# Patient Record
Sex: Male | Born: 1970
Health system: Southern US, Community
[De-identification: ages and names within clinical notes are randomized; demographics above are authoritative.]

## PROBLEM LIST (undated history)

## (undated) DIAGNOSIS — Q600 Renal agenesis, unilateral: Secondary | ICD-10-CM

## (undated) DIAGNOSIS — N189 Chronic kidney disease, unspecified: Secondary | ICD-10-CM

## (undated) DIAGNOSIS — Z8553 Personal history of malignant neoplasm of renal pelvis: Secondary | ICD-10-CM

## (undated) DIAGNOSIS — C679 Malignant neoplasm of bladder, unspecified: Secondary | ICD-10-CM

## (undated) DIAGNOSIS — Z87898 Personal history of other specified conditions: Secondary | ICD-10-CM

## (undated) DIAGNOSIS — R3911 Hesitancy of micturition: Secondary | ICD-10-CM

## (undated) DIAGNOSIS — IMO0002 Reserved for concepts with insufficient information to code with codable children: Secondary | ICD-10-CM

## (undated) DIAGNOSIS — R030 Elevated blood-pressure reading, without diagnosis of hypertension: Secondary | ICD-10-CM

---

## 2001-05-01 ENCOUNTER — Emergency Department (HOSPITAL_COMMUNITY): Admission: EM | Admit: 2001-05-01 | Discharge: 2001-05-01 | Payer: Self-pay | Admitting: Emergency Medicine

## 2004-12-02 ENCOUNTER — Emergency Department (HOSPITAL_COMMUNITY): Admission: EM | Admit: 2004-12-02 | Discharge: 2004-12-02 | Payer: Self-pay | Admitting: Emergency Medicine

## 2006-05-16 ENCOUNTER — Emergency Department (HOSPITAL_COMMUNITY): Admission: EM | Admit: 2006-05-16 | Discharge: 2006-05-16 | Payer: Self-pay | Admitting: Emergency Medicine

## 2006-06-19 ENCOUNTER — Emergency Department (HOSPITAL_COMMUNITY): Admission: EM | Admit: 2006-06-19 | Discharge: 2006-06-19 | Payer: Self-pay | Admitting: Emergency Medicine

## 2007-12-11 ENCOUNTER — Emergency Department (HOSPITAL_COMMUNITY): Admission: EM | Admit: 2007-12-11 | Discharge: 2007-12-11 | Payer: Self-pay | Admitting: Emergency Medicine

## 2009-04-02 ENCOUNTER — Emergency Department (HOSPITAL_COMMUNITY): Admission: EM | Admit: 2009-04-02 | Discharge: 2009-04-02 | Payer: Self-pay | Admitting: Emergency Medicine

## 2010-05-01 ENCOUNTER — Emergency Department (HOSPITAL_COMMUNITY)
Admission: EM | Admit: 2010-05-01 | Discharge: 2010-05-01 | Payer: Self-pay | Source: Home / Self Care | Admitting: Emergency Medicine

## 2011-08-02 ENCOUNTER — Emergency Department (INDEPENDENT_AMBULATORY_CARE_PROVIDER_SITE_OTHER): Payer: Worker's Compensation

## 2011-08-02 ENCOUNTER — Emergency Department (HOSPITAL_BASED_OUTPATIENT_CLINIC_OR_DEPARTMENT_OTHER)
Admission: EM | Admit: 2011-08-02 | Discharge: 2011-08-02 | Disposition: A | Discharge status: Home / Self Care | Payer: Worker's Compensation | Attending: Emergency Medicine | Admitting: Emergency Medicine

## 2011-08-02 ENCOUNTER — Encounter (HOSPITAL_BASED_OUTPATIENT_CLINIC_OR_DEPARTMENT_OTHER): Payer: Self-pay

## 2011-08-02 DIAGNOSIS — R0789 Other chest pain: Secondary | ICD-10-CM | POA: Insufficient documentation

## 2011-08-02 DIAGNOSIS — R0602 Shortness of breath: Secondary | ICD-10-CM | POA: Insufficient documentation

## 2011-08-02 DIAGNOSIS — J9801 Acute bronchospasm: Secondary | ICD-10-CM

## 2011-08-02 DIAGNOSIS — X398XXA Other exposure to forces of nature, initial encounter: Secondary | ICD-10-CM | POA: Insufficient documentation

## 2011-08-02 DIAGNOSIS — R059 Cough, unspecified: Secondary | ICD-10-CM | POA: Insufficient documentation

## 2011-08-02 DIAGNOSIS — F17200 Nicotine dependence, unspecified, uncomplicated: Secondary | ICD-10-CM | POA: Insufficient documentation

## 2011-08-02 DIAGNOSIS — R05 Cough: Secondary | ICD-10-CM | POA: Insufficient documentation

## 2011-08-02 MED ORDER — ALBUTEROL SULFATE (5 MG/ML) 0.5% IN NEBU
2.5000 mg | INHALATION_SOLUTION | Freq: Once | RESPIRATORY_TRACT | Status: AC
  Administered 2011-08-02: 2.5 mg via RESPIRATORY_TRACT
  Filled 2011-08-02: qty 0.5

## 2011-08-02 MED ORDER — ALBUTEROL SULFATE HFA 108 (90 BASE) MCG/ACT IN AERS: 2.0000 | INHALATION_SPRAY | RESPIRATORY_TRACT | Status: DC | PRN

## 2011-08-02 NOTE — ED Provider Notes (Signed)
History     CSN: [id number]  Arrival date & time 08/02/11  1311   First MD Initiated Contact with Patient 08/02/11 1322      Chief Complaint  Patient presents with  . Shortness of Breath    (Consider location/radiation/quality/duration/timing/severity/associated sxs/prior treatment) Patient is a 41 y.o. male presenting with shortness of breath. The history is provided by the patient.  Shortness of Breath  Associated symptoms include shortness of breath.  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 He noticed a strong chemical odor and started coughing and tightness in his chest and some difficulty breathing. The tightness in the chest has subsided but is still present. He no longer feels like he is having difficulty breathing. Cough was nonproductive. He was unable to characterize the odor but states that he had a similar exposure on one other occasion. He does not know what the agent was since the material was inside a garbage truck which had collected from numerous locations. Symptoms are moderate to severe.  Past Medical History  Diagnosis Date  . Heart murmur of newborn     History reviewed. No pertinent past surgical history.  History reviewed. No pertinent family history.  History  Substance Use Topics  . Smoking status: Current Everyday Smoker -- 0.5 packs/day    Types: Cigarettes  . Smokeless tobacco: Never Used  . Alcohol Use: 33.6 oz/week    56 Cans of beer per week      Review of Systems  Respiratory: Positive for shortness of breath.   All other systems reviewed and are negative.    Allergies  Valium  Home Medications  No current outpatient prescriptions on file.  BP 163/99  Pulse 80  Temp 98.3 F (36.8 C)  Resp 20  Ht 5\' 7"  (1.702 m)  Wt 205 lb (92.987 kg)  BMI 32.11 kg/m2  SpO2 99%  Physical Exam  Nursing note and vitals reviewed.  41 year old male who is resting comfortably in no acute  distress. Vital signs are significant for mild hypertension with blood pressure 163/99. Oxygen saturation is 99% which is normal. Head is normocephalic atraumatic. PERRLA, EOMI. Oropharynx is clear. Neck is nontender supple without stridor. Lungs have scattered wheezes without rales or rhonchi. Heart has regular rate rhythm without murmur. Abdomen is soft, flat, nontender without masses or hepatosplenomegaly. Extremities have no cyanosis or edema, full range of motion is present. Skin is warm and dry without rash. Neurologic: Mental status is normal, cranial nerves are intact, there no focal motor or sensory deficits.  ED Course  Procedures (including critical care time)  Dg Chest 2 View  08/02/2011  *RADIOLOGY REPORT*  Clinical Data: Shortness of breath  CHEST - 2 VIEW  Comparison: 04/02/2009  Findings: Cardiomediastinal silhouette is stable.  No acute infiltrate or pleural effusion.  No pulmonary edema.  Bony thorax is stable.  IMPRESSION: No active disease.  No significant change.  Original Report Authenticated By: Natasha Mead, M.D.     ECG shows normal sinus rhythm with a rate of 78, no ectopy. Normal axis. Normal P wave. Normal QRS. Normal intervals. Normal ST and T waves. Impression: normal ECG. No old ECG available for comparison.  Reevaluation after nebulizer treatment: His lungs are clear and his chest tightness has completely resolved. You'll be sent home with a prescription for an albuterol inhaler to use just in case symptoms recur and he is given a note to be off work the  remainder of today.  1. Bronchospasm       MDM  Reactive bronchospasm secondary to exposure to unknown agent. I don't see any evidence of any caustic exposure and no evidence of a toxidrome. He'll be treated symptomatically with albuterol and chest x-ray obtained.        Dione Booze, MD 08/02/11 [phone]

## 2011-08-02 NOTE — ED Notes (Signed)
Pt c/o of chest tightness that started 1 hour ago after smelling something while at work. Describes pain as constant and non radiating . States was SOB at that time with "sweating"  Presently not SOB or diaphroitc

## 2011-08-02 NOTE — ED Notes (Signed)
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  Pt states that he has ongoing chest tightness, and lost breath upon inhalation in nad at this time.  No cough.

## 2011-08-02 NOTE — Discharge Instructions (Signed)
Bronchospasm, Adult Bronchospasm means that there is a spasm or tightening of the airways going into the lungs. Because the airways go into a spasm and get smaller it makes breathing more difficult. For reasons not completely known, workings (functions) of the airways designed to protect the lungs become over active. This causes the airways to become more sensitive to:  Infection.   Weather.   Exercise.   Irritants.   Things that cause allergic reactions or allergies (allergens).  Frequent coughing or respiratory episodes should be checked for the cause. This condition may be made worse by exercise. CAUSES  Inflammation is often the cause of this condition. Allergy, viral respiratory infections, or irritants in the air often cause this problem. Allergic reactions produce immediate and delayed responses. Late reactions may produce more serious inflammation. This may lead to increased reactivity of the airways. Sometimes this is inherited. Some common triggers are:  Allergies.   Infection commonly triggers attacks. Antibiotics are not helpful for viral infections and usually do not help with attacks of bronchospasm.   Exercise (running, etc.) can trigger an attack. Proper pre-exercise medications help most individuals participate in sports. Swimming is the least likely sport to cause problems.   Irritants (for example, pollution, cigarette smoke, strong odors, aerosol sprays, paint fumes, etc.) may trigger attacks. You cannot smoke and do not allow smoking in your home. This is absolutely necessary. Show this instruction to mates, relatives and significant others that may not agree with you.   Weather changes may cause lung problems but moving around trying to find an ideal climate does not seem to be overly helpful. Winds increase molds and pollens in the air. Rain refreshes the air by washing irritants out. Cold air may cause irritation.   Emotional problems do not cause lung problems but  can trigger attacks.  SYMPTOMS  Wheezing is the most common symptom. Frequent coughing (with or without exercise and or crying) and repeated respiratory infections are all early warning signs of bronchospasm. Chest tightness and shortness of breath are other symptoms. DIAGNOSIS  Early hidden bronchospasm may go for long periods of time without being detected. This is especially true if wheezing cannot be detected by your caregiver. Lung (pulmonary) function studies may help with diagnosis in these cases. HOME CARE INSTRUCTIONS   It is necessary to remain calm during an attack. Try to relax and breathe more slowly. During this time medications may be given. If any breathing problems seem to be getting worse and are unresponsive to treatment seek immediate medical care.   If you have severe breathing difficulty or have had a life threatening attack it is probably a good idea for you to learn how to give adrenaline (epi-pen) or use an anaphylaxis kit. Your caregiver can help you with this. These are the same kits carried by people who have severe allergic reactions. This is especially important if you do not have readily accessible medical care.   With any severe breathing problems where epinephrine (adrenaline) has been given at home call 911 immediately as the delayed reaction may be even more severe.  SEEK MEDICAL CARE IF:   There is wheezing and shortness of breath, even if medications are given to prevent attacks.   An oral temperature above 102 F (38.9 C) develops.   There are muscle aches, chest pain, or thickening of sputum.   The sputum changes from clear or white to yellow, green, gray, or bloody.   There are problems that may be related to   the medicine you are given, such as a rash, itching, swelling, or trouble breathing.  SEEK IMMEDIATE MEDICAL CARE IF:   The usual medicines do not stop your wheezing, or there is increased coughing.   You have increased difficulty breathing.    MAKE SURE YOU:   Understand these instructions.   Will watch your condition.   Will get help right away if you are not doing well or get worse.  Document Released: 04/04/2003 Document Revised: 03/21/2011 Document Reviewed: 11/18/2007 [facility] Patient Information 2012 [place], [place].  Albuterol inhalation aerosol What is this medicine? ALBUTEROL (al Gaspar Bidding) is a bronchodilator. It helps open up the airways in your lungs to make it easier to breathe. This medicine is used to treat and to prevent bronchospasm. This medicine may be used for other purposes; ask your health care provider or pharmacist if you have questions. What should I tell my health care provider before I take this medicine? They need to know if you have any of the following conditions: -diabetes -heart disease or irregular heartbeat -high blood pressure -pheochromocytoma -seizures -thyroid disease -an unusual or allergic reaction to albuterol, levalbuterol, sulfites, other medicines, foods, dyes, or preservatives -pregnant or trying to get pregnant -breast-feeding How should I use this medicine? This medicine is for inhalation through the mouth. Follow the directions on your prescription label. Take your medicine at regular intervals. Do not use more often than directed. Make sure that you are using your inhaler correctly. Ask you doctor or health care provider if you have any questions. Use this medicine before you use any other inhaler. Wait 5 minutes or more before between using different inhalers. Talk to your pediatrician regarding the use of this medicine in children. Special care may be needed. Overdosage: If you think you have taken too much of this medicine contact a poison control center or emergency room at once. NOTE: This medicine is only for you. Do not share this medicine with others. What if I miss a dose? If you miss a dose, use it as soon as you can. If it is almost time for your next dose,  use only that dose. Do not use double or extra doses. What may interact with this medicine? -anti-infectives like chloroquine and pentamidine -caffeine -cisapride -diuretics -medicines for colds -medicines for depression or for emotional or psychotic conditions -medicines for weight loss including some herbal products -methadone -some antibiotics like clarithromycin, erythromycin, levofloxacin, and linezolid -some heart medicines -steroid hormones like dexamethasone, cortisone, hydrocortisone -theophylline -thyroid hormones This list may not describe all possible interactions. Give your health care provider a list of all the medicines, herbs, non-prescription drugs, or dietary supplements you use. Also tell them if you smoke, drink alcohol, or use illegal drugs. Some items may interact with your medicine. What should I watch for while using this medicine? Tell your doctor or health care professional if your symptoms do not improve. Do not use extra albuterol. If your asthma or bronchitis gets worse while you are using this medicine, call your doctor right away. If your mouth gets dry try chewing sugarless gum or sucking hard candy. Drink water as directed. What side effects may I notice from receiving this medicine? Side effects that you should report to your doctor or health care professional as soon as possible: -allergic reactions like skin rash, itching or hives, swelling of the face, lips, or tongue -breathing problems -chest pain -feeling faint or lightheaded, falls -high blood pressure -irregular heartbeat -fever -muscle cramps or  weakness -pain, tingling, numbness in the hands or feet -vomiting Side effects that usually do not require medical attention (report to your doctor or health care professional if they continue or are bothersome): -cough -difficulty sleeping -headache -nervousness or trembling -stomach upset -stuffy or runny nose -throat irritation -unusual  taste This list may not describe all possible side effects. Call your doctor for medical advice about side effects. You may report side effects to FDA at 1-800-FDA-1088. Where should I keep my medicine? Keep out of the reach of children. Store at room temperature between 15 and 30 degrees C (59 and 86 degrees F). The contents are under pressure and may burst when exposed to heat or flame. Do not freeze. This medicine does not work as well if it is too cold. Throw away any unused medicine after the expiration date. Inhalers need to be thrown away after the labeled number of puffs have been used or by the expiration date; whichever comes first. Ventolin HFA should be thrown away 12 months after removing from foil pouch. Check the instructions that come with your medicine. NOTE: This sheet is a summary. It may not cover all possible information. If you have questions about this medicine, talk to your doctor, pharmacist, or health care provider.  2012, [employer]vier/[employer] Standard. (08/17/2010 11:00:52 AM)

## 2011-08-02 NOTE — ED Notes (Signed)
Chart reviewed and care assumed.  Dr. Preston Fleeting at bedside.

## 2012-01-11 ENCOUNTER — Emergency Department (HOSPITAL_COMMUNITY)
Admission: EM | Admit: 2012-01-11 | Discharge: 2012-01-11 | Disposition: A | Discharge status: Home / Self Care | Payer: Self-pay | Attending: Emergency Medicine | Admitting: Emergency Medicine

## 2012-01-11 ENCOUNTER — Emergency Department (HOSPITAL_COMMUNITY): Payer: Self-pay

## 2012-01-11 ENCOUNTER — Other Ambulatory Visit: Payer: Self-pay

## 2012-01-11 ENCOUNTER — Encounter (HOSPITAL_COMMUNITY): Payer: Self-pay | Admitting: *Deleted

## 2012-01-11 DIAGNOSIS — F17200 Nicotine dependence, unspecified, uncomplicated: Secondary | ICD-10-CM | POA: Insufficient documentation

## 2012-01-11 DIAGNOSIS — J40 Bronchitis, not specified as acute or chronic: Secondary | ICD-10-CM | POA: Insufficient documentation

## 2012-01-11 MED ORDER — ALBUTEROL SULFATE HFA 108 (90 BASE) MCG/ACT IN AERS
2.0000 | INHALATION_SPRAY | RESPIRATORY_TRACT | Status: DC | PRN
  Administered 2012-01-11: 2 via RESPIRATORY_TRACT
  Filled 2012-01-11: qty 6.7

## 2012-01-11 MED ORDER — ALBUTEROL SULFATE (5 MG/ML) 0.5% IN NEBU
5.0000 mg | INHALATION_SOLUTION | Freq: Once | RESPIRATORY_TRACT | Status: AC
  Administered 2012-01-11: 5 mg via RESPIRATORY_TRACT
  Filled 2012-01-11: qty 1

## 2012-01-11 MED ORDER — AZITHROMYCIN 250 MG PO TABS: 250.0000 mg | ORAL_TABLET | Freq: Every day | ORAL | Status: DC

## 2012-01-11 MED ORDER — AZITHROMYCIN 250 MG PO TABS
500.0000 mg | ORAL_TABLET | Freq: Once | ORAL | Status: AC
  Administered 2012-01-11: 500 mg via ORAL
  Filled 2012-01-11: qty 2

## 2012-01-11 NOTE — ED Provider Notes (Signed)
History     CSN: [id number]  Arrival date & time 01/11/12  0040   First MD Initiated Contact with Patient 01/11/12 0207      Chief Complaint  Patient presents with  . Cough    (Consider location/radiation/quality/duration/timing/severity/associated sxs/prior treatment) HPI Comments: URI symptoms that have resolved but persistent cough some yellow sputum and wheezing   Patient is a 41 y.o. male presenting with cough. The history is provided by the patient.  Cough This is a new problem. The current episode started more than 1 week ago. The problem occurs every few minutes. The problem has been gradually worsening. The cough is productive of sputum. The maximum temperature recorded prior to his arrival was 100 to 100.9 F. Associated symptoms include wheezing. Pertinent negatives include no chest pain, no chills, no headaches, no rhinorrhea and no shortness of breath.    Past Medical History  Diagnosis Date  . Heart murmur of newborn     History reviewed. No pertinent past surgical history.  History reviewed. No pertinent family history.  History  Substance Use Topics  . Smoking status: Current Every Day Smoker -- 2.0 packs/day    Types: Cigarettes  . Smokeless tobacco: Never Used  . Alcohol Use: 33.6 oz/week    56 Cans of beer per week      Review of Systems  Constitutional: Negative for fever and chills.  HENT: Negative for congestion and rhinorrhea.   Respiratory: Positive for cough and wheezing. Negative for shortness of breath.   Cardiovascular: Negative for chest pain.  Neurological: Negative for dizziness, weakness and headaches.    Allergies  Valium  Home Medications   Current Outpatient Rx  Name Route Sig Dispense Refill  . AZITHROMYCIN 250 MG PO TABS Oral Take 1 tablet (250 mg total) by mouth daily. 4 tablet 0    BP 121/77  Pulse 111  Temp 98.2 F (36.8 C) (Oral)  Resp 22  SpO2 96%  Physical Exam  Constitutional: He is oriented to person,  place, and time. He appears well-developed and well-nourished.  Neck: Normal range of motion.  Cardiovascular: Normal rate and regular rhythm.   Pulmonary/Chest: Effort normal. No respiratory distress. He has wheezes.  Abdominal: He exhibits no distension.  Musculoskeletal: Normal range of motion.  Neurological: He is alert and oriented to person, place, and time.  Skin: Skin is warm.    ED Course  Procedures (including critical care time)  Labs Reviewed - No data to display Dg Chest 2 View  01/11/2012  *RADIOLOGY REPORT*  Clinical Data: Persistent cough  CHEST - 2 VIEW  Comparison: Chest radiograph 08/02/2011.  Findings: Normal mediastinum and heart silhouette.  Costophrenic angles are clear.  No effusion, infiltrate, or pneumothorax.  There is chronic bronchitic markings centrally.  IMPRESSION:  1.  No acute cardiopulmonary findings. 2.  Chronic bronchitic markings suggest bronchiolitis or chronic bronchitic change.   Original Report Authenticated By: Genevive Bi, M.D.      1. Bronchitis       MDM   Xray reviewed + for bronchitis        Arman Filter, NP 01/11/12 0432  Arman Filter, NP 01/11/12 4098  Arman Filter, NP 01/11/12 [phone]

## 2012-01-11 NOTE — ED Notes (Signed)
NP notified pt is feeling better and is ready to go.  Pt continues to have post exp wheezes, but sats wnl.

## 2012-01-11 NOTE — ED Notes (Signed)
Pt states he has had a cold for a week.  Tried mucinex with no relief.  Denies having asthma but states he is a smoker.

## 2012-01-11 NOTE — ED Notes (Signed)
Pt c/o fever, productive yellow cough, congestion, chest tightness since Wednesday.

## 2012-01-11 NOTE — ED Provider Notes (Signed)
Medical screening examination/treatment/procedure(s) were performed by non-physician practitioner and as supervising physician I was immediately available for consultation/collaboration.  Demitra Danley K Leeloo Silverthorne-Rasch, MD 01/11/12 0451 

## 2012-06-20 ENCOUNTER — Emergency Department (HOSPITAL_COMMUNITY): Payer: Self-pay

## 2012-06-20 ENCOUNTER — Encounter (HOSPITAL_COMMUNITY): Payer: Self-pay | Admitting: Emergency Medicine

## 2012-06-20 ENCOUNTER — Emergency Department (HOSPITAL_COMMUNITY)
Admission: EM | Admit: 2012-06-20 | Discharge: 2012-06-20 | Disposition: A | Discharge status: Home / Self Care | Payer: Self-pay | Attending: Emergency Medicine | Admitting: Emergency Medicine

## 2012-06-20 DIAGNOSIS — Y9329 Activity, other involving ice and snow: Secondary | ICD-10-CM | POA: Insufficient documentation

## 2012-06-20 DIAGNOSIS — Y9289 Other specified places as the place of occurrence of the external cause: Secondary | ICD-10-CM | POA: Insufficient documentation

## 2012-06-20 DIAGNOSIS — S2232XA Fracture of one rib, left side, initial encounter for closed fracture: Secondary | ICD-10-CM

## 2012-06-20 DIAGNOSIS — S2249XA Multiple fractures of ribs, unspecified side, initial encounter for closed fracture: Secondary | ICD-10-CM | POA: Insufficient documentation

## 2012-06-20 DIAGNOSIS — I10 Essential (primary) hypertension: Secondary | ICD-10-CM | POA: Insufficient documentation

## 2012-06-20 DIAGNOSIS — W010XXA Fall on same level from slipping, tripping and stumbling without subsequent striking against object, initial encounter: Secondary | ICD-10-CM | POA: Insufficient documentation

## 2012-06-20 DIAGNOSIS — F17200 Nicotine dependence, unspecified, uncomplicated: Secondary | ICD-10-CM | POA: Insufficient documentation

## 2012-06-20 DIAGNOSIS — R011 Cardiac murmur, unspecified: Secondary | ICD-10-CM | POA: Insufficient documentation

## 2012-06-20 DIAGNOSIS — W108XXA Fall (on) (from) other stairs and steps, initial encounter: Secondary | ICD-10-CM | POA: Insufficient documentation

## 2012-06-20 DIAGNOSIS — W009XXA Unspecified fall due to ice and snow, initial encounter: Secondary | ICD-10-CM

## 2012-06-20 MED ORDER — NAPROXEN 500 MG PO TABS: 500.0000 mg | ORAL_TABLET | Freq: Two times a day (BID) | ORAL | Status: DC

## 2012-06-20 MED ORDER — OXYCODONE-ACETAMINOPHEN 5-325 MG PO TABS: 1.0000 | ORAL_TABLET | ORAL | Status: DC | PRN

## 2012-06-20 MED ORDER — OXYCODONE-ACETAMINOPHEN 5-325 MG PO TABS
2.0000 | ORAL_TABLET | Freq: Once | ORAL | Status: AC
  Administered 2012-06-20: 2 via ORAL
  Filled 2012-06-20: qty 2

## 2012-06-20 NOTE — ED Notes (Signed)
Pt given incentive spirometer and instructed to use it at home throughout the day.

## 2012-06-20 NOTE — ED Notes (Signed)
Onset one day ago slipped top stair onto left side of ribs down 6 steps.  Pain currently 10/10 sharp left side of ribs, left hip and thigh abrasions.

## 2012-06-20 NOTE — ED Provider Notes (Signed)
History    This chart was scribed for non-physician practitioner working with Timothy Bucco, MD by Timothy Hickman, ED Scribe. This patient was seen in room TR11C/TR11C and the patient's care was started at 4:00 PM.    CSN: [id number]  Arrival date & time 06/20/12  1103   First MD Initiated Contact with Patient 06/20/12 1516      Chief Complaint  Patient presents with  . Rib Injury  . Fall     The history is provided by the patient. No language interpreter was used.  Timothy Hickman is a 42 y.o. male who presents to the Emergency Department complaining of constant, sharp left posterior lower rib pain rated as 9/10 with sudden onset after slipping on ice and landing on a set of stairs on the left side and then sliding down 5 stairs.  Pain worsened by breathing, movement and palpation; nothing makes it better.  No head trauma or LOC.  Pt denies neck pain, back pain, loss of vision, numbness or tingling in extremities, loss of bowel or bladder control.  No OCM, ice, or heat used for pain.      Past Medical History  Diagnosis Date  . Heart murmur of newborn   . Hypertension     History reviewed. No pertinent past surgical history.  No family history on file.  History  Substance Use Topics  . Smoking status: Current Every Day Smoker -- 2.00 packs/day    Types: Cigarettes  . Smokeless tobacco: Never Used  . Alcohol Use: 0.0 oz/week      Review of Systems  HENT: Negative for neck pain.   Musculoskeletal: Negative for back pain.       Left posterior rib pain  Neurological: Negative for numbness.  All other systems reviewed and are negative.    Allergies  Valium  Home Medications   Current Outpatient Rx  Name  Route  Sig  Dispense  Refill  . naproxen (NAPROSYN) 500 MG tablet   Oral   Take 1 tablet (500 mg total) by mouth 2 (two) times daily with a meal.   30 tablet   0   . oxyCODONE-acetaminophen (PERCOCET) 5-325 MG per tablet   Oral   Take 1 tablet by mouth every 4  (four) hours as needed for pain.   20 tablet   0     BP 181/114  Pulse 94  Temp(Src) 98 F (36.7 C) (Oral)  Resp 16  SpO2 98%  Physical Exam  Nursing note and vitals reviewed. Constitutional: He is oriented to person, place, and time. He appears well-developed and well-nourished. No distress.  HENT:  Head: Normocephalic and atraumatic.  Mouth/Throat: Oropharynx is clear and moist.  Eyes: Conjunctivae and EOM are normal. Pupils are equal, round, and reactive to light.  Neck: Normal range of motion. Neck supple.  Cardiovascular: Normal rate, regular rhythm, normal heart sounds and intact distal pulses.   Intact distal pulses, capillary refill < 3 seconds  Pulmonary/Chest: Effort normal and breath sounds normal. No respiratory distress. He has no wheezes. He has no rales. He exhibits no tenderness.  Abdominal: Soft. Bowel sounds are normal. He exhibits no distension. There is no tenderness.  No ecchymosis  Musculoskeletal:  No spinous process or paraspinal tenderness, left side lower ribs mild swelling, no ecchymosis, tender to palpation, abrasion along left hip.  All other extremities with normal ROM  Neurological: He is alert and oriented to person, place, and time.  Speech is clear and goal oriented, follows  commands Normal strength in upper and lower extremities bilaterally including dorsiflexion and plantar flexion, strong and equal grip strength Sensation normal to light and sharp touch Moves extremities without ataxia, coordination intact Normal gait and balance   Skin: Skin is warm. He is not diaphoretic. No erythema.  Skin intact, no tenting  Psychiatric: He has a normal mood and affect.    ED Course  Procedures (including critical care time) DIAGNOSTIC STUDIES: Oxygen Saturation is 98% on room air, normal by my interpretation.    COORDINATION OF CARE: 4:06 PM- Informed pt of fractures noted to ribs 8 and 9.  Discussed incentive spirometer to be used at home.   Discussed follow-up with orthopedist.  Pt understands and agrees with plan.     Dg Ribs Unilateral W/chest Left  06/20/2012  *RADIOLOGY REPORT*  Clinical Data: Fall, left anterior chest pain  LEFT RIBS AND CHEST - 3+ VIEW  Comparison: Chest radiograph dated 01/11/2012  Findings: Lungs are clear. No pleural effusion or pneumothorax.  Cardiomediastinal silhouette is within normal limits.  Possible nondisplaced fractures involving the left anterolateral 8th and 9th ribs.  IMPRESSION: Possible nondisplaced fractures involving the left anterolateral 8th and 9th ribs.   Original Report Authenticated By: Timothy Bills, M.D.      1. Rib fracture, left, closed, initial encounter   2. Fall from slipping on ice, initial encounter       MDM  Timothy Hickman presents after fall.  Pt with nondisplaced rib fractures on x-ray. Tender to palpation in the area. Patient with good tidal volume, no hemoptysis, no decreased breath sounds and no pneumothorax on x-ray. Patient given definitive rib structures including use of incentive spirometer 10 times per hour to prevent pneumonia; use of pillow when coughing and taking NSAIDs along with pain medications. Patient also advised to followup with orthopedics if not improved in one week.  I have also discussed reasons to return immediately to the ER including difficulty breathing, hemoptysis.  Patient expresses understanding and agrees with plan.  1. Medications: naproxyn, percocet, usual home medications 2. Treatment: rest, drink plenty of fluids, gentle stretching as discussed, alternate ice and heat, use pillow when coughing, use incentive spirometer 10 times per hour while awake 3. Follow Up: Please followup with your primary doctor for discussion of your diagnoses and further evaluation after today's visit; if you do not have a primary care doctor use the resource guide provided to find one; followup with orthopedics if no improvement in one week  I personally  performed the services described in this documentation, which was scribed in my presence. The recorded information has been reviewed and is accurate.      Timothy Client Muthersbaugh, PA-C 06/20/12 1643

## 2012-06-20 NOTE — ED Notes (Signed)
Pt c/o left sided rib, pt reports he woke up with the pain, last night he fell on the ice onto his left side. Pt in nad, ambulatory to the room, skin warm and dry, resp e/u.

## 2012-06-20 NOTE — ED Provider Notes (Signed)
Medical screening examination/treatment/procedure(s) were performed by non-physician practitioner and as supervising physician I was immediately available for consultation/collaboration.   Rolan Bucco, MD 06/20/12 1754

## 2012-06-20 NOTE — ED Notes (Signed)
Pt reports his BP various but has been very stressed with his job the past week.

## 2013-05-17 ENCOUNTER — Emergency Department (HOSPITAL_COMMUNITY)
Admission: EM | Admit: 2013-05-17 | Discharge: 2013-05-17 | Disposition: A | Discharge status: Home / Self Care | Payer: Self-pay | Attending: Emergency Medicine | Admitting: Emergency Medicine

## 2013-05-17 ENCOUNTER — Encounter (HOSPITAL_COMMUNITY): Payer: Self-pay | Admitting: Emergency Medicine

## 2013-05-17 ENCOUNTER — Emergency Department (HOSPITAL_COMMUNITY): Payer: Self-pay

## 2013-05-17 DIAGNOSIS — F17200 Nicotine dependence, unspecified, uncomplicated: Secondary | ICD-10-CM | POA: Insufficient documentation

## 2013-05-17 DIAGNOSIS — R062 Wheezing: Secondary | ICD-10-CM | POA: Insufficient documentation

## 2013-05-17 DIAGNOSIS — I10 Essential (primary) hypertension: Secondary | ICD-10-CM | POA: Insufficient documentation

## 2013-05-17 DIAGNOSIS — Z79899 Other long term (current) drug therapy: Secondary | ICD-10-CM | POA: Insufficient documentation

## 2013-05-17 DIAGNOSIS — R0789 Other chest pain: Secondary | ICD-10-CM | POA: Insufficient documentation

## 2013-05-17 DIAGNOSIS — J069 Acute upper respiratory infection, unspecified: Secondary | ICD-10-CM | POA: Insufficient documentation

## 2013-05-17 DIAGNOSIS — R011 Cardiac murmur, unspecified: Secondary | ICD-10-CM | POA: Insufficient documentation

## 2013-05-17 MED ORDER — GUAIFENESIN-CODEINE 100-10 MG/5ML PO SOLN: 5.0000 mL | Freq: Four times a day (QID) | ORAL | Status: DC | PRN

## 2013-05-17 MED ORDER — ALBUTEROL SULFATE HFA 108 (90 BASE) MCG/ACT IN AERS: 2.0000 | INHALATION_SPRAY | RESPIRATORY_TRACT | Status: DC | PRN

## 2013-05-17 MED ORDER — GUAIFENESIN 100 MG/5ML PO SOLN
5.0000 mL | Freq: Once | ORAL | Status: AC
  Administered 2013-05-17: 100 mg via ORAL
  Filled 2013-05-17 (×2): qty 5

## 2013-05-17 MED ORDER — ALBUTEROL SULFATE HFA 108 (90 BASE) MCG/ACT IN AERS
2.0000 | INHALATION_SPRAY | Freq: Once | RESPIRATORY_TRACT | Status: AC
  Administered 2013-05-17: 2 via RESPIRATORY_TRACT
  Filled 2013-05-17: qty 6.7

## 2013-05-17 NOTE — Discharge Instructions (Signed)
Please follow up with your primary care physician in 1-2 days. If you do not have one please call the [place] number listed above. Please take medications as prescribed. Please do not drive on the Robitussin with Codeine as it contains a narcotic. Please read all discharge instructions and return precautions.   Upper Respiratory Infection, Adult An upper respiratory infection (URI) is also sometimes known as the common cold. The upper respiratory tract includes the nose, sinuses, throat, trachea, and bronchi. Bronchi are the airways leading to the lungs. Most people improve within 1 week, but symptoms can last up to 2 weeks. A residual cough may last even longer.  CAUSES Many different viruses can infect the tissues lining the upper respiratory tract. The tissues become irritated and inflamed and often become very moist. Mucus production is also common. A cold is contagious. You can easily spread the virus to others by oral contact. This includes kissing, sharing a glass, coughing, or sneezing. Touching your mouth or nose and then touching a surface, which is then touched by another person, can also spread the virus. SYMPTOMS  Symptoms typically develop 1 to 3 days after you come in contact with a cold virus. Symptoms vary from person to person. They may include:  Runny nose.  Sneezing.  Nasal congestion.  Sinus irritation.  Sore throat.  Loss of voice (laryngitis).  Cough.  Fatigue.  Muscle aches.  Loss of appetite.  Headache.  Low-grade fever. DIAGNOSIS  You might diagnose your own cold based on familiar symptoms, since most people get a cold 2 to 3 times a year. Your caregiver can confirm this based on your exam. Most importantly, your caregiver can check that your symptoms are not due to another disease such as strep throat, sinusitis, pneumonia, asthma, or epiglottitis. Blood tests, throat tests, and X-rays are not necessary to diagnose a common cold, but  they may sometimes be helpful in excluding other more serious diseases. Your caregiver will decide if any further tests are required. RISKS AND COMPLICATIONS  You may be at risk for a more severe case of the common cold if you smoke cigarettes, have chronic heart disease (such as heart failure) or lung disease (such as asthma), or if you have a weakened immune system. The very young and very old are also at risk for more serious infections. Bacterial sinusitis, middle ear infections, and bacterial pneumonia can complicate the common cold. The common cold can worsen asthma and chronic obstructive pulmonary disease (COPD). Sometimes, these complications can require emergency medical care and may be life-threatening. PREVENTION  The best way to protect against getting a cold is to practice good hygiene. Avoid oral or hand contact with people with cold symptoms. Wash your hands often if contact occurs. There is no clear evidence that vitamin C, vitamin E, echinacea, or exercise reduces the chance of developing a cold. However, it is always recommended to get plenty of rest and practice good nutrition. TREATMENT  Treatment is directed at relieving symptoms. There is no cure. Antibiotics are not effective, because the infection is caused by a virus, not by bacteria. Treatment may include:  Increased fluid intake. Sports drinks offer valuable electrolytes, sugars, and fluids.  Breathing heated mist or steam (vaporizer or shower).  Eating chicken soup or other clear broths, and maintaining good nutrition.  Getting plenty of rest.  Using gargles or lozenges for comfort.  Controlling fevers with ibuprofen or acetaminophen as directed by your caregiver.  Increasing usage of  your inhaler if you have asthma. Zinc gel and zinc lozenges, taken in the first 24 hours of the common cold, can shorten the duration and lessen the severity of symptoms. Pain medicines may help with fever, muscle aches, and throat  pain. A variety of non-prescription medicines are available to treat congestion and runny nose. Your caregiver can make recommendations and may suggest nasal or lung inhalers for other symptoms.  HOME CARE INSTRUCTIONS   Only take over-the-counter or prescription medicines for pain, discomfort, or fever as directed by your caregiver.  Use a warm mist humidifier or inhale steam from a shower to increase air moisture. This may keep secretions moist and make it easier to breathe.  Drink enough water and fluids to keep your urine clear or pale yellow.  Rest as needed.  Return to work when your temperature has returned to normal or as your caregiver advises. You may need to stay home longer to avoid infecting others. You can also use a face mask and careful hand washing to prevent spread of the virus. SEEK MEDICAL CARE IF:   After the first few days, you feel you are getting worse rather than better.  You need your caregiver's advice about medicines to control symptoms.  You develop chills, worsening shortness of breath, or brown or red sputum. These may be signs of pneumonia.  You develop yellow or brown nasal discharge or pain in the face, especially when you bend forward. These may be signs of sinusitis.  You develop a fever, swollen neck glands, pain with swallowing, or white areas in the back of your throat. These may be signs of strep throat. SEEK IMMEDIATE MEDICAL CARE IF:   You have a fever.  You develop severe or persistent headache, ear pain, sinus pain, or chest pain.  You develop wheezing, a prolonged cough, cough up blood, or have a change in your usual mucus (if you have chronic lung disease).  You develop sore muscles or a stiff neck. Document Released: 09/25/2000 Document Revised: 06/24/2011 Document Reviewed: 08/03/2010 [facility] Patient Information 2014 [place], [place].   Arterial Hypertension Arterial hypertension (high blood pressure) is a condition of elevated  pressure in your blood vessels. Hypertension over a long period of time is a risk factor for strokes, heart attacks, and heart failure. It is also the leading cause of kidney (renal) failure.  CAUSES   In Adults -- Over 90% of all hypertension has no known cause. This is called essential or primary hypertension. In the other 10% of people with hypertension, the increase in blood pressure is caused by another disorder. This is called secondary hypertension. Important causes of secondary hypertension are:  Heavy alcohol use.  Obstructive sleep apnea.  Hyperaldosterosim (Conn's syndrome).  Steroid use.  Chronic kidney failure.  Hyperparathyroidism.  Medications.  Renal artery stenosis.  Pheochromocytoma.  Cushing's disease.  Coarctation of the aorta.  Scleroderma renal crisis.  Licorice (in excessive amounts).  Drugs (cocaine, methamphetamine). Your caregiver can explain any items above that apply to you.  In Children -- Secondary hypertension is more common and should always be considered.  Pregnancy -- Few women of childbearing age have high blood pressure. However, up to 10% of them develop hypertension of pregnancy. Generally, this will not harm the woman. It may be a sign of 3 complications of pregnancy: preeclampsia, HELLP syndrome, and eclampsia. Follow up and control with medication is necessary. SYMPTOMS   This condition normally does not produce any noticeable symptoms. It is usually found during  a routine exam.  Malignant hypertension is a late problem of high blood pressure. It may have the following symptoms:  Headaches.  Blurred vision.  End-organ damage (this means your kidneys, heart, lungs, and other organs are being damaged).  Stressful situations can increase the blood pressure. If a person with normal blood pressure has their blood pressure go up while being seen by their caregiver, this is often termed "white coat hypertension." Its importance is not  known. It may be related with eventually developing hypertension or complications of hypertension.  Hypertension is often confused with mental tension, stress, and anxiety. DIAGNOSIS  The diagnosis is made by 3 separate blood pressure measurements. They are taken at least 1 week apart from each other. If there is organ damage from hypertension, the diagnosis may be made without repeat measurements. Hypertension is usually identified by having blood pressure readings:  Above 140/90 mmHg measured in both arms, at 3 separate times, over a couple weeks.  Over 130/80 mmHg should be considered a risk factor and may require treatment in patients with diabetes. Blood pressure readings over 120/80 mmHg are called "pre-hypertension" even in non-diabetic patients. To get a true blood pressure measurement, use the following guidelines. Be aware of the factors that can alter blood pressure readings.  Take measurements at least 1 hour after caffeine.  Take measurements 30 minutes after smoking and without any stress. This is another reason to quit smoking  it raises your blood pressure.  Use a proper cuff size. Ask your caregiver if you are not sure about your cuff size.  Most home blood pressure cuffs are automatic. They will measure systolic and diastolic pressures. The systolic pressure is the pressure reading at the start of sounds. Diastolic pressure is the pressure at which the sounds disappear. If you are elderly, measure pressures in multiple postures. Try sitting, lying or standing.  Sit at rest for a minimum of 5 minutes before taking measurements.  You should not be on any medications like decongestants. These are found in many cold medications.  Record your blood pressure readings and review them with your caregiver. If you have hypertension:  Your caregiver may do tests to be sure you do not have secondary hypertension (see "causes" above).  Your caregiver may also look for signs of  metabolic syndrome. This is also called Syndrome X or Insulin Resistance Syndrome. You may have this syndrome if you have type 2 diabetes, abdominal obesity, and abnormal blood lipids in addition to hypertension.  Your caregiver will take your medical and family history and perform a physical exam.  Diagnostic tests may include blood tests (for glucose, cholesterol, potassium, and kidney function), a urinalysis, or an EKG. Other tests may also be necessary depending on your condition. PREVENTION  There are important lifestyle issues that you can adopt to reduce your chance of developing hypertension:  Maintain a normal weight.  Limit the amount of salt (sodium) in your diet.  Exercise often.  Limit alcohol intake.  Get enough potassium in your diet. Discuss specific advice with your caregiver.  Follow a DASH diet (dietary approaches to stop hypertension). This diet is rich in fruits, vegetables, and low-fat dairy products, and avoids certain fats. PROGNOSIS  Essential hypertension cannot be cured. Lifestyle changes and medical treatment can lower blood pressure and reduce complications. The prognosis of secondary hypertension depends on the underlying cause. Many people whose hypertension is controlled with medicine or lifestyle changes can live a normal, healthy life.  RISKS AND COMPLICATIONS  While high blood pressure alone is not an illness, it often requires treatment due to its short- and long-term effects on many organs. Hypertension increases your risk for:  CVAs or strokes (cerebrovascular accident).  Heart failure due to chronically high blood pressure (hypertensive cardiomyopathy).  Heart attack (myocardial infarction).  Damage to the retina (hypertensive retinopathy).  Kidney failure (hypertensive nephropathy). Your caregiver can explain list items above that apply to you. Treatment of hypertension can significantly reduce the risk of complications. TREATMENT   For  overweight patients, weight loss and regular exercise are recommended. Physical fitness lowers blood pressure.  Mild hypertension is usually treated with diet and exercise. A diet rich in fruits and vegetables, fat-free dairy products, and foods low in fat and salt (sodium) can help lower blood pressure. Decreasing salt intake decreases blood pressure in a 1/3 of people.  Stop smoking if you are a smoker. The steps above are highly effective in reducing blood pressure. While these actions are easy to suggest, they are difficult to achieve. Most patients with moderate or severe hypertension end up requiring medications to bring their blood pressure down to a normal level. There are several classes of medications for treatment. Blood pressure pills (antihypertensives) will lower blood pressure by their different actions. Lowering the blood pressure by 10 mmHg may decrease the risk of complications by as much as 25%. The goal of treatment is effective blood pressure control. This will reduce your risk for complications. Your caregiver will help you determine the best treatment for you according to your lifestyle. What is excellent treatment for one person, may not be for you. HOME CARE INSTRUCTIONS   Do not smoke.  Follow the lifestyle changes outlined in the "Prevention" section.  If you are on medications, follow the directions carefully. Blood pressure medications must be taken as prescribed. Skipping doses reduces their benefit. It also puts you at risk for problems.  Follow up with your caregiver, as directed.  If you are asked to monitor your blood pressure at home, follow the guidelines in the "Diagnosis" section above. SEEK MEDICAL CARE IF:   You think you are having medication side effects.  You have recurrent headaches or lightheadedness.  You have swelling in your ankles.  You have trouble with your vision. SEEK IMMEDIATE MEDICAL CARE IF:   You have sudden onset of chest pain or  pressure, difficulty breathing, or other symptoms of a heart attack.  You have a severe headache.  You have symptoms of a stroke (such as sudden weakness, difficulty speaking, difficulty walking). MAKE SURE YOU:   Understand these instructions.  Will watch your condition.  Will get help right away if you are not doing well or get worse. Document Released: 04/01/2005 Document Revised: 06/24/2011 Document Reviewed: 10/30/2006 [facility] Patient Information 2014 [place].

## 2013-05-17 NOTE — ED Provider Notes (Signed)
CSN: [id number]     Arrival date & time 05/17/13  6834 History   First MD Initiated Contact with Patient 05/17/13 1007     Chief Complaint  Patient presents with  . Cough   (Consider location/radiation/quality/duration/timing/severity/associated sxs/prior Treatment) HPI Comments: Patient is a 43 yo M PMHx significant for HTN, tobacco user presenting to the ED for three days of gradually worsening productive cough with associated mild to moderate posttussive chest tightness, rhinorrhea and nasal congestion. No alleviating or aggravating symptoms. Patient endorses a history of bronchitis and states these symptoms feel the exact same. Denies any fevers, nausea, vomiting, CP, SOB, abdominal pain. PERC negative.    Past Medical History  Diagnosis Date  . Heart murmur of newborn   . Hypertension    History reviewed. No pertinent past surgical history. No family history on file. History  Substance Use Topics  . Smoking status: Current Every Day Smoker -- 2.00 packs/day    Types: Cigarettes  . Smokeless tobacco: Never Used  . Alcohol Use: 0.0 oz/week    Review of Systems  Constitutional: Negative for fever.  Respiratory: Positive for cough and chest tightness. Negative for shortness of breath and wheezing.   Cardiovascular: Negative for chest pain.  Gastrointestinal: Negative for nausea, vomiting and abdominal pain.  All other systems reviewed and are negative.    Allergies  Valium  Home Medications   Current Outpatient Rx  Name  Route  Sig  Dispense  Refill  . albuterol (PROVENTIL HFA;VENTOLIN HFA) 108 (90 BASE) MCG/ACT inhaler   Inhalation   Inhale 2 puffs into the lungs every 4 (four) hours as needed for wheezing or shortness of breath.   1 Inhaler   1   . guaiFENesin-codeine 100-10 MG/5ML syrup   Oral   Take 5 mLs by mouth every 6 (six) hours as needed for cough.   120 mL   0    BP 177/115  Pulse 98  Temp(Src) 98.4 F (36.9 C) (Oral)  Resp 18  Ht 5\' 7"  (1.702 m)   Wt 220 lb 9.6 oz (100.064 kg)  BMI 34.54 kg/m2  SpO2 96% Physical Exam  Constitutional: He is oriented to person, place, and time. He appears well-developed and well-nourished.  HENT:  Head: Normocephalic and atraumatic.  Right Ear: External ear normal.  Left Ear: External ear normal.  Nose: Nose normal.  Eyes: Conjunctivae are normal.  Neck: Normal range of motion. Neck supple.  Cardiovascular: Normal rate, regular rhythm and normal heart sounds.   Pulmonary/Chest: Effort normal. No accessory muscle usage. Not tachypneic and not bradypneic. No respiratory distress. He has no decreased breath sounds. He has wheezes in the left upper field. He has no rhonchi. He has no rales. He exhibits no tenderness.  Abdominal: Soft. There is no tenderness.  Musculoskeletal: Normal range of motion. He exhibits no edema.  Lymphadenopathy:    He has no cervical adenopathy.  Neurological: He is alert and oriented to person, place, and time.  Skin: Skin is warm and dry.    ED Course  Procedures (including critical care time) Medications  albuterol (PROVENTIL HFA;VENTOLIN HFA) 108 (90 BASE) MCG/ACT inhaler 2 puff (2 puffs Inhalation Given 05/17/13 1115)  guaiFENesin (ROBITUSSIN) 100 MG/5ML solution 100 mg (100 mg Oral Given 05/17/13 1125)    Labs Review Labs Reviewed - No data to display Imaging Review Dg Chest 2 View (if Patient Has Fever And/or Copd)  05/17/2013   CLINICAL DATA:  Cough for 3 days.  EXAM: CHEST  2 VIEW  COMPARISON:  06/20/2012  FINDINGS: The heart size and mediastinal contours are within normal limits. Both lungs are clear. The visualized skeletal structures are unremarkable.  IMPRESSION: No active cardiopulmonary disease.   Electronically Signed   By: Lajean Manes M.D.   On: 05/17/2013 10:12    EKG Interpretation   None       MDM   1. Upper respiratory infection   2. Hypertension     Filed Vitals:   05/17/13 1125  BP: 177/115  Pulse: 98  Temp:   Resp: 18     Afebrile, NAD, non-toxic appearing, AAOx4.   1) URI: Pt CXR negative for acute infiltrate. Patients symptoms are consistent with URI, likely viral etiology. Wheezing improved after albuterol administration. Discussed that antibiotics are not indicated for viral infections. Pt will be discharged with symptomatic treatment.    2) HTN: Patient noted to be hypertensive in the emergency department.  No signs of hypertensive urgency.  Discussed with patient the need for close follow-up and management by their primary care physician.   Return precautions discussed. Patient verrbalizes understanding and is agreeable with plan. Pt is hemodynamically stable & in NAD prior to [place], PA-C 05/17/13 1350

## 2013-05-17 NOTE — ED Notes (Signed)
Pt c/o productive cough onset Saturday. 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 Pt reports yellow colored secretions when coughing. Pt talking in complete sentences.

## 2013-05-17 NOTE — ED Notes (Signed)
Jen P, PA aware of pt BP, informed this nurse ok to discharge but to make follow up appt for HTN

## 2013-05-18 NOTE — ED Provider Notes (Signed)
Medical screening examination/treatment/procedure(s) were performed by non-physician practitioner and as supervising physician I was immediately available for consultation/collaboration.  EKG Interpretation   None         Houston Siren III, MD 05/18/13 [phone]

## 2014-01-27 ENCOUNTER — Encounter (HOSPITAL_COMMUNITY): Payer: Self-pay | Admitting: Emergency Medicine

## 2014-01-27 ENCOUNTER — Emergency Department (HOSPITAL_COMMUNITY): Payer: Self-pay

## 2014-01-27 ENCOUNTER — Emergency Department (HOSPITAL_COMMUNITY)
Admission: EM | Admit: 2014-01-27 | Discharge: 2014-01-28 | Disposition: A | Discharge status: Home / Self Care | Payer: Self-pay | Attending: Emergency Medicine | Admitting: Emergency Medicine

## 2014-01-27 DIAGNOSIS — Z720 Tobacco use: Secondary | ICD-10-CM | POA: Insufficient documentation

## 2014-01-27 DIAGNOSIS — R011 Cardiac murmur, unspecified: Secondary | ICD-10-CM | POA: Insufficient documentation

## 2014-01-27 DIAGNOSIS — R109 Unspecified abdominal pain: Secondary | ICD-10-CM | POA: Insufficient documentation

## 2014-01-27 DIAGNOSIS — Z79899 Other long term (current) drug therapy: Secondary | ICD-10-CM | POA: Insufficient documentation

## 2014-01-27 DIAGNOSIS — I10 Essential (primary) hypertension: Secondary | ICD-10-CM | POA: Insufficient documentation

## 2014-01-27 DIAGNOSIS — N2889 Other specified disorders of kidney and ureter: Secondary | ICD-10-CM | POA: Insufficient documentation

## 2014-01-27 LAB — CBC WITH DIFFERENTIAL/PLATELET
Basophils Absolute: 0 10*3/uL (ref 0.0–0.1)
Basophils Relative: 0 % (ref 0–1)
EOS ABS: 0.5 10*3/uL (ref 0.0–0.7)
EOS PCT: 4 % (ref 0–5)
HEMATOCRIT: 44.1 % (ref 39.0–52.0)
Hemoglobin: 15.2 g/dL (ref 13.0–17.0)
LYMPHS PCT: 15 % (ref 12–46)
Lymphs Abs: 1.8 10*3/uL (ref 0.7–4.0)
MCH: 32.3 pg (ref 26.0–34.0)
MCHC: 34.5 g/dL (ref 30.0–36.0)
MCV: 93.6 fL (ref 78.0–100.0)
MONO ABS: 0.8 10*3/uL (ref 0.1–1.0)
Monocytes Relative: 7 % (ref 3–12)
Neutro Abs: 8.8 10*3/uL — ABNORMAL HIGH (ref 1.7–7.7)
Neutrophils Relative %: 74 % (ref 43–77)
PLATELETS: 263 10*3/uL (ref 150–400)
RBC: 4.71 MIL/uL (ref 4.22–5.81)
RDW: 12.6 % (ref 11.5–15.5)
WBC: 11.9 10*3/uL — AB (ref 4.0–10.5)

## 2014-01-27 MED ORDER — SODIUM CHLORIDE 0.9 % IV SOLN
1000.0000 mL | INTRAVENOUS | Status: DC
  Administered 2014-01-28: 1000 mL via INTRAVENOUS

## 2014-01-27 MED ORDER — HYDROMORPHONE HCL 1 MG/ML IJ SOLN
1.0000 mg | INTRAMUSCULAR | Status: DC | PRN
  Administered 2014-01-28: 1 mg via INTRAVENOUS
  Filled 2014-01-27: qty 1

## 2014-01-27 MED ORDER — ONDANSETRON HCL 4 MG/2ML IJ SOLN
4.0000 mg | Freq: Once | INTRAMUSCULAR | Status: AC
  Administered 2014-01-28: 4 mg via INTRAVENOUS
  Filled 2014-01-27: qty 2

## 2014-01-27 NOTE — ED Provider Notes (Signed)
CSN: [id number]     Arrival date & time 01/27/14  2254 History   First MD Initiated Contact with Patient 01/27/14 2332     Chief Complaint  Patient presents with  . Urinary Retention   Patient is a 43 y.o. male presenting with frequency. The history is provided by the patient.  Urinary Frequency This is a new problem. The current episode started 6 to 12 hours ago. The problem occurs constantly. The problem has been gradually worsening. Associated symptoms include abdominal pain. Pertinent negatives include no headaches and no shortness of breath. Nothing relieves the symptoms.   the patient noticed at about 5 PM he had an urge to urinate. Ever since that time he has not been able to completely empty his bladder. He has been going to the bathroom constantly and urinating just small amounts. He has also had some pain that is sharp in the right side of his groin. He denies any trouble with nausea or vomiting. He has not had any diarrhea. Patient has never had anything similar to this in the past. There is a family history of kidney stones.  Past Medical History  Diagnosis Date  . Heart murmur of newborn   . Hypertension    History reviewed. No pertinent past surgical history. History reviewed. No pertinent family history. History  Substance Use Topics  . Smoking status: Current Every Day Smoker -- 2.00 packs/day    Types: Cigarettes  . Smokeless tobacco: Never Used  . Alcohol Use: 0.0 oz/week    Review of Systems  Respiratory: Negative for shortness of breath.   Gastrointestinal: Positive for abdominal pain.  Genitourinary: Positive for frequency.  Neurological: Negative for headaches.  All other systems reviewed and are negative.     Allergies  Valium  Home Medications   Prior to Admission medications   Medication Sig Start Date End Date Taking? Authorizing Provider  HYDROcodone-acetaminophen (NORCO/VICODIN) 5-325 MG per tablet Take 1-2 tablets by mouth every 4 (four) hours  as needed. 01/28/14   Dorie Rank, MD  phenazopyridine (PYRIDIUM) 200 MG tablet Take 1 tablet (200 mg total) by mouth 3 (three) times daily. 01/28/14   Dorie Rank, MD   BP 216/114  Pulse 103  Temp(Src) 97.8 F (36.6 C) (Oral)  Resp 20  SpO2 100% Physical Exam  Nursing note and vitals reviewed. Constitutional: He appears well-developed and well-nourished. No distress.  HENT:  Head: Normocephalic and atraumatic.  Right Ear: External ear normal.  Left Ear: External ear normal.  Eyes: Conjunctivae are normal. Right eye exhibits no discharge. Left eye exhibits no discharge. No scleral icterus.  Neck: Neck supple. No tracheal deviation present.  Cardiovascular: Normal rate, regular rhythm and intact distal pulses.   Pulmonary/Chest: Effort normal and breath sounds normal. No stridor. No respiratory distress. He has no wheezes. He has no rales.  Abdominal: Soft. Bowel sounds are normal. He exhibits no distension. There is no tenderness. There is no rebound and no guarding.  Foley catheter placed prior to my exam, red tinged urine in the catheter bag, small amount maybe 30 cc  Musculoskeletal: He exhibits no edema and no tenderness.  Neurological: He is alert. He has normal strength. No cranial nerve deficit (no facial droop, extraocular movements intact, no slurred speech) or sensory deficit. He exhibits normal muscle tone. He displays no seizure activity. Coordination normal.  Skin: Skin is warm and dry. No rash noted.  Psychiatric: He has a normal mood and affect.    ED Course  Procedures (  including critical care time) Labs Review Labs Reviewed  URINALYSIS, ROUTINE W REFLEX MICROSCOPIC - Abnormal; Notable for the following:    Color, Urine BIOCHEMICALS MAY BE AFFECTED BY COLOR (*)    APPearance CLOUDY (*)    Hgb urine dipstick LARGE (*)    Bilirubin Urine SMALL (*)    Ketones, ur 15 (*)    Protein, ur 30 (*)    Leukocytes, UA TRACE (*)    All other components within normal limits   CBC WITH DIFFERENTIAL - Abnormal; Notable for the following:    WBC 11.9 (*)    Neutro Abs 8.8 (*)    All other components within normal limits  COMPREHENSIVE METABOLIC PANEL - Abnormal; Notable for the following:    Glucose, Bld 114 (*)    GFR calc non Af Amer 81 (*)    Anion gap 17 (*)    All other components within normal limits  LIPASE, BLOOD  URINE MICROSCOPIC-ADD ON    Imaging Review Ct Abdomen Pelvis Wo Contrast  01/28/2014   CLINICAL DATA:  Sudden onset right flank pain with difficulty urinating. Initial encounter.  EXAM: CT ABDOMEN AND PELVIS WITHOUT CONTRAST  TECHNIQUE: Multidetector CT imaging of the abdomen and pelvis was performed following the standard protocol without IV contrast.  COMPARISON:  None.  FINDINGS: BODY WALL: 15 mm sebaceous cyst in the lower thoracic back.  LOWER CHEST: Unremarkable.  ABDOMEN/PELVIS:  Liver: Diffuse fatty infiltration of the liver. There is sparing of the caudate lobe and gallbladder fossa.  Biliary: No evidence of biliary obstruction or stone.  Pancreas: Unremarkable.  Spleen: Unremarkable.  Adrenals: Unremarkable.  Kidneys and ureters: Diffuse high density and expanded appearance of the right urinary collecting system. No discrete calculus is identified. Multiple cystic spaces in the upper right kidneys suggest chronic urinary obstruction. There is also adenopathy within the regional retroperitoneum, pericaval and periaortic, measuring up to 12 mm short axis. The left kidney appears normal. Overall, favor a urothelial mass, xanthogranulomatous pyelonephritis, or renal tuberculosis.  Bladder: Decompressed by Foley catheter.  Reproductive: Unremarkable for age.  Bowel: No obstruction. Normal appendix.  Retroperitoneum: No mass or adenopathy.  Peritoneum: No ascites or pneumoperitoneum.  Vascular: No acute abnormality.  OSSEOUS: No acute abnormalities.  IMPRESSION: Probable diffuse hemorrhage in the right upper urinary tract, without urolithiasis. There  is underlying right renal abnormality with differential considerations discussed above. Recommend contrast-enhanced CT, preferably hematuria protocol.   Electronically Signed   By: Jorje Guild M.D.   On: 01/28/2014 00:13   Ct Abdomen Pelvis W Contrast  01/28/2014   CLINICAL DATA:  Abnormal noncontrast abdominal CT. Hematuria. Flank pain. Initial encounter.  EXAM: CT ABDOMEN AND PELVIS WITH CONTRAST  TECHNIQUE: Multidetector CT imaging of the abdomen and pelvis was performed using the standard protocol following bolus administration of intravenous contrast.  CONTRAST:  178mL OMNIPAQUE IOHEXOL 300 MG/ML  SOLN  COMPARISON:  Noncontrast abdominal CT -earlier same day  FINDINGS: There is enhancing abnormal soft tissue within the caudal aspect of the right renal pelvis which measures approximately 25 Hounsfield units on the noncontrast images performed earlier same day and approximately 49 Hounsfield units on the postcontrast images (image 33, series 2) and measuring approximately 4.1 x 4.0 cm (as measured in greatest oblique coronal dimension - coronal image 38, series 4, though note, exact measurements are difficult secondary to the infiltrative appearance of this renal pelvic mass).  These findings are associated with adjacent retroperitoneal adenopathy with index peri-caval lymph node measuring 1.4 cm  in greatest short axis diameter (image 11, series 7) and index precaval lymph node measuring 0.9 cm (image 25).  The right-sided renal vein appears patent.  The abnormal enhancing soft tissue appears to extends to involve the superior aspect of the right ureter (coronal image 45, series 4). There is abnormal asymmetric wall thickening and enhancement throughout the remainder of the right ureter with associated adjacent periureteral mesenteric stranding, most conspicuous at the level of the right mid/lower pelvis (representative axial image 58, series 2).  There is mildly delayed enhancement of the right kidney  with associated delayed excretion. Re- demonstrated moderate to severe right-sided pelvicaliectasis and mild adjacent perirenal stranding.  No discrete left-sided renal lesions. No left-sided urinary obstruction.  The urinary bladder is decompressed with a Foley catheter. Prostatic calcifications within a normal sized prostate gland.  ----------------------------------------------------  Normal hepatic contour. There is diffuse decreased attenuation of the hepatic parenchyma suggestive hepatic steatosis. No radiopaque renal stones. No intra extrahepatic biliary duct dilatation. No ascites.  Normal appearance of the bilateral adrenal glands, pancreas and spleen.  The bowel is normal in course and caliber without wall thickening or evidence of obstruction. Normal appearance of the appendix. No pneumoperitoneum, pneumatosis or portal venous gas.  Scattered atherosclerotic plaque with a normal caliber abdominal aorta. The major branch vessels of the abdominal aorta appear patent on this non CTA examination.  Limited visualization of lower thorax is negative focal airspace opacity or PE.  Normal heart size.  No pericardial effusion.  No acute or aggressive osseus abnormalities.  Incidental note is made of an approximately 2.0 x 1.4 cm hypo attenuating (approximately 16 Hounsfield unit) presumed sebaceous cyst within the midline of the mid back.  IMPRESSION: 1. Enhancing infiltrative at least 4.1 cm mass within the right renal pelvis worrisome for transitional carcinoma. Right-sided retroperitoneal adenopathy worrisome for metastatic disease. The right-sided renal vein remains patent. 2. Asymmetrically decreased perfusion and delayed excretion of the right kidney with associated moderate to severe right-sided pelvicaliectasis.   Electronically Signed   By: Sandi Mariscal M.D.   On: 01/28/2014 02:36    Medications  0.9 %  sodium chloride infusion (1,000 mLs Intravenous New Bag/Given 01/28/14 0015)  HYDROmorphone  (DILAUDID) injection 1 mg (1 mg Intravenous Given 01/28/14 0015)  ondansetron (ZOFRAN) injection 4 mg (4 mg Intravenous Given 01/28/14 0015)  iohexol (OMNIPAQUE) 300 MG/ML solution 100 mL (100 mLs Intravenous Contrast Given 01/28/14 0141)  iohexol (OMNIPAQUE) 300 MG/ML solution 50 mL (50 mLs Intravenous Contrast Given 01/28/14 0219)     MDM   Final diagnoses:  Renal mass, right    The patient's CT scan is concerning for a renal cell carcinoma.  I discussed the findings with the patient and his family. I spoke with Dr. Risa Grill who is on call for urology.  He will be able to follow up with the patient closely in the office.  The patient did not have urinary retention when his catheter was placed.  Just 30 cc of urine.  Will remove catheter.  Dc with pyridium and hydrocodone.  Dorie Rank, MD 01/28/14 [phone]
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
## 2014-01-28 ENCOUNTER — Encounter (HOSPITAL_COMMUNITY): Payer: Self-pay

## 2014-01-28 ENCOUNTER — Emergency Department (HOSPITAL_COMMUNITY): Payer: Self-pay

## 2014-01-28 LAB — COMPREHENSIVE METABOLIC PANEL
ALT: 34 U/L (ref 0–53)
AST: 24 U/L (ref 0–37)
Albumin: 4.1 g/dL (ref 3.5–5.2)
Alkaline Phosphatase: 95 U/L (ref 39–117)
Anion gap: 17 — ABNORMAL HIGH (ref 5–15)
BUN: 13 mg/dL (ref 6–23)
CALCIUM: 8.8 mg/dL (ref 8.4–10.5)
CO2: 23 meq/L (ref 19–32)
CREATININE: 1.08 mg/dL (ref 0.50–1.35)
Chloride: 100 mEq/L (ref 96–112)
GFR calc non Af Amer: 81 mL/min — ABNORMAL LOW (ref >90)
GLUCOSE: 114 mg/dL — AB (ref 70–99)
Potassium: 4.3 mEq/L (ref 3.7–5.3)
SODIUM: 140 meq/L (ref 137–147)
TOTAL PROTEIN: 7.8 g/dL (ref 6.0–8.3)
Total Bilirubin: 0.6 mg/dL (ref 0.3–1.2)

## 2014-01-28 LAB — URINALYSIS, ROUTINE W REFLEX MICROSCOPIC
Glucose, UA: NEGATIVE mg/dL
Ketones, ur: 15 mg/dL — AB
NITRITE: NEGATIVE
PH: 5.5 (ref 5.0–8.0)
Protein, ur: 30 mg/dL — AB
SPECIFIC GRAVITY, URINE: 1.025 (ref 1.005–1.030)
UROBILINOGEN UA: 1 mg/dL (ref 0.0–1.0)

## 2014-01-28 LAB — URINE MICROSCOPIC-ADD ON

## 2014-01-28 LAB — LIPASE, BLOOD: Lipase: 19 U/L (ref 11–59)

## 2014-01-28 MED ORDER — IOHEXOL 300 MG/ML  SOLN
50.0000 mL | Freq: Once | INTRAMUSCULAR | Status: AC | PRN
  Administered 2014-01-28: 50 mL via INTRAVENOUS

## 2014-01-28 MED ORDER — IOHEXOL 300 MG/ML  SOLN
100.0000 mL | Freq: Once | INTRAMUSCULAR | Status: AC | PRN
  Administered 2014-01-28: 100 mL via INTRAVENOUS

## 2014-01-28 MED ORDER — HYDROCODONE-ACETAMINOPHEN 5-325 MG PO TABS: 1.0000 | ORAL_TABLET | ORAL | Status: DC | PRN

## 2014-01-28 MED ORDER — PHENAZOPYRIDINE HCL 200 MG PO TABS: 200.0000 mg | ORAL_TABLET | Freq: Three times a day (TID) | ORAL | Status: DC

## 2014-02-07 ENCOUNTER — Other Ambulatory Visit: Payer: Self-pay | Admitting: Urology

## 2014-03-08 ENCOUNTER — Encounter (HOSPITAL_BASED_OUTPATIENT_CLINIC_OR_DEPARTMENT_OTHER): Payer: Self-pay | Admitting: *Deleted

## 2014-05-03 ENCOUNTER — Encounter (HOSPITAL_BASED_OUTPATIENT_CLINIC_OR_DEPARTMENT_OTHER): Payer: Self-pay | Admitting: *Deleted

## 2014-05-03 NOTE — Progress Notes (Signed)
NPO AFTER MN.  ARRIVE AT 0715.  NEEDS HG. 

## 2014-05-05 ENCOUNTER — Ambulatory Visit (HOSPITAL_BASED_OUTPATIENT_CLINIC_OR_DEPARTMENT_OTHER): Payer: No Typology Code available for payment source | Admitting: Anesthesiology

## 2014-05-05 ENCOUNTER — Ambulatory Visit (HOSPITAL_BASED_OUTPATIENT_CLINIC_OR_DEPARTMENT_OTHER)
Admission: RE | Admit: 2014-05-05 | Discharge: 2014-05-05 | Disposition: A | Discharge status: Home / Self Care | Payer: No Typology Code available for payment source | Source: Ambulatory Visit | Attending: Urology | Admitting: Urology

## 2014-05-05 ENCOUNTER — Encounter (HOSPITAL_BASED_OUTPATIENT_CLINIC_OR_DEPARTMENT_OTHER): Payer: Self-pay | Admitting: Anesthesiology

## 2014-05-05 ENCOUNTER — Encounter (HOSPITAL_BASED_OUTPATIENT_CLINIC_OR_DEPARTMENT_OTHER)
Admission: RE | Disposition: A | Discharge status: Home / Self Care | Payer: Self-pay | Source: Ambulatory Visit | Attending: Urology

## 2014-05-05 DIAGNOSIS — Z6833 Body mass index (BMI) 33.0-33.9, adult: Secondary | ICD-10-CM | POA: Diagnosis not present

## 2014-05-05 DIAGNOSIS — F17200 Nicotine dependence, unspecified, uncomplicated: Secondary | ICD-10-CM | POA: Insufficient documentation

## 2014-05-05 DIAGNOSIS — E669 Obesity, unspecified: Secondary | ICD-10-CM | POA: Diagnosis not present

## 2014-05-05 DIAGNOSIS — N135 Crossing vessel and stricture of ureter without hydronephrosis: Secondary | ICD-10-CM | POA: Diagnosis not present

## 2014-05-05 DIAGNOSIS — R1900 Intra-abdominal and pelvic swelling, mass and lump, unspecified site: Secondary | ICD-10-CM | POA: Diagnosis present

## 2014-05-05 DIAGNOSIS — C659 Malignant neoplasm of unspecified renal pelvis: Secondary | ICD-10-CM | POA: Diagnosis not present

## 2014-05-05 DIAGNOSIS — G4733 Obstructive sleep apnea (adult) (pediatric): Secondary | ICD-10-CM | POA: Diagnosis not present

## 2014-05-05 DIAGNOSIS — C641 Malignant neoplasm of right kidney, except renal pelvis: Secondary | ICD-10-CM

## 2014-05-05 DIAGNOSIS — I10 Essential (primary) hypertension: Secondary | ICD-10-CM | POA: Diagnosis not present

## 2014-05-05 DIAGNOSIS — N2889 Other specified disorders of kidney and ureter: Secondary | ICD-10-CM | POA: Diagnosis present

## 2014-05-05 HISTORY — PX: CYSTOSCOPY WITH URETEROSCOPY: SHX5123

## 2014-05-05 HISTORY — DX: Elevated blood-pressure reading, without diagnosis of hypertension: R03.0

## 2014-05-05 LAB — POCT HEMOGLOBIN-HEMACUE: Hemoglobin: 15 g/dL (ref 13.0–17.0)

## 2014-05-05 SURGERY — CYSTOSCOPY WITH URETEROSCOPY
Anesthesia: General | Site: Ureter | Laterality: Right

## 2014-05-05 MED ORDER — FENTANYL CITRATE 0.05 MG/ML IJ SOLN
INTRAMUSCULAR | Status: DC | PRN
  Administered 2014-05-05 (×4): 50 ug via INTRAVENOUS

## 2014-05-05 MED ORDER — LIDOCAINE HCL (CARDIAC) 20 MG/ML IV SOLN
INTRAVENOUS | Status: DC | PRN
  Administered 2014-05-05: 100 mg via INTRAVENOUS

## 2014-05-05 MED ORDER — ACETAMINOPHEN 10 MG/ML IV SOLN
INTRAVENOUS | Status: DC | PRN
  Administered 2014-05-05: 1000 mg via INTRAVENOUS

## 2014-05-05 MED ORDER — SODIUM CHLORIDE 0.9 % IR SOLN
Status: DC | PRN
  Administered 2014-05-05: 3000 mL
  Administered 2014-05-05: 4500 mL

## 2014-05-05 MED ORDER — TRAMADOL-ACETAMINOPHEN 37.5-325 MG PO TABS: 1.0000 | ORAL_TABLET | Freq: Four times a day (QID) | ORAL | Status: DC | PRN

## 2014-05-05 MED ORDER — LABETALOL HCL 5 MG/ML IV SOLN
5.0000 mg | INTRAVENOUS | Status: DC | PRN
Start: 2014-05-05 — End: 2014-05-05
  Administered 2014-05-05: 5 mg via INTRAVENOUS
  Filled 2014-05-05: qty 4

## 2014-05-05 MED ORDER — BELLADONNA ALKALOIDS-OPIUM 16.2-60 MG RE SUPP
RECTAL | Status: DC | PRN
  Administered 2014-05-05: 1 via RECTAL

## 2014-05-05 MED ORDER — MIDAZOLAM HCL 2 MG/2ML IJ SOLN
INTRAMUSCULAR | Status: AC
Start: 2014-05-05 — End: 2014-05-05
  Filled 2014-05-05: qty 2

## 2014-05-05 MED ORDER — PHENAZOPYRIDINE HCL 200 MG PO TABS
200.0000 mg | ORAL_TABLET | Freq: Three times a day (TID) | ORAL | Status: DC
  Administered 2014-05-05: 200 mg via ORAL
  Filled 2014-05-05: qty 1

## 2014-05-05 MED ORDER — FENTANYL CITRATE 0.05 MG/ML IJ SOLN
INTRAMUSCULAR | Status: AC
  Filled 2014-05-05: qty 4

## 2014-05-05 MED ORDER — PHENAZOPYRIDINE HCL 100 MG PO TABS
ORAL_TABLET | ORAL | Status: AC
  Filled 2014-05-05: qty 2

## 2014-05-05 MED ORDER — IOHEXOL 350 MG/ML SOLN
INTRAVENOUS | Status: DC | PRN
  Administered 2014-05-05: 40 mL

## 2014-05-05 MED ORDER — KETOROLAC TROMETHAMINE 30 MG/ML IJ SOLN
INTRAMUSCULAR | Status: DC | PRN
  Administered 2014-05-05: 30 mg via INTRAVENOUS

## 2014-05-05 MED ORDER — FENTANYL CITRATE 0.05 MG/ML IJ SOLN
25.0000 ug | INTRAMUSCULAR | Status: DC | PRN
  Filled 2014-05-05: qty 1

## 2014-05-05 MED ORDER — DEXAMETHASONE SODIUM PHOSPHATE 4 MG/ML IJ SOLN
INTRAMUSCULAR | Status: DC | PRN
  Administered 2014-05-05: 10 mg via INTRAVENOUS

## 2014-05-05 MED ORDER — CIPROFLOXACIN HCL 500 MG PO TABS: 500.0000 mg | ORAL_TABLET | Freq: Two times a day (BID) | ORAL | Status: DC

## 2014-05-05 MED ORDER — ONDANSETRON HCL 4 MG/2ML IJ SOLN
INTRAMUSCULAR | Status: DC | PRN
  Administered 2014-05-05: 4 mg via INTRAVENOUS

## 2014-05-05 MED ORDER — LABETALOL HCL 5 MG/ML IV SOLN
INTRAVENOUS | Status: DC | PRN
  Administered 2014-05-05 (×2): 5 mg via INTRAVENOUS

## 2014-05-05 MED ORDER — LACTATED RINGERS IV SOLN
INTRAVENOUS | Status: DC
  Administered 2014-05-05: 08:00:00 via INTRAVENOUS
  Filled 2014-05-05: qty 1000

## 2014-05-05 MED ORDER — MIDAZOLAM HCL 5 MG/5ML IJ SOLN
INTRAMUSCULAR | Status: DC | PRN
  Administered 2014-05-05: 2 mg via INTRAVENOUS

## 2014-05-05 MED ORDER — BELLADONNA ALKALOIDS-OPIUM 16.2-60 MG RE SUPP
RECTAL | Status: AC
  Filled 2014-05-05: qty 1

## 2014-05-05 MED ORDER — TRAMADOL HCL 50 MG PO TABS
ORAL_TABLET | ORAL | Status: AC
  Filled 2014-05-05: qty 1

## 2014-05-05 MED ORDER — CEFAZOLIN SODIUM-DEXTROSE 2-3 GM-% IV SOLR
INTRAVENOUS | Status: AC
  Filled 2014-05-05: qty 50

## 2014-05-05 MED ORDER — PROPOFOL 10 MG/ML IV BOLUS
INTRAVENOUS | Status: DC | PRN
  Administered 2014-05-05: 300 mg via INTRAVENOUS

## 2014-05-05 MED ORDER — TRAMADOL HCL 50 MG PO TABS
50.0000 mg | ORAL_TABLET | Freq: Four times a day (QID) | ORAL | Status: DC | PRN
  Administered 2014-05-05: 50 mg via ORAL
  Filled 2014-05-05: qty 1

## 2014-05-05 MED ORDER — PROMETHAZINE HCL 25 MG/ML IJ SOLN
6.2500 mg | INTRAMUSCULAR | Status: DC | PRN
  Filled 2014-05-05: qty 1

## 2014-05-05 MED ORDER — PHENAZOPYRIDINE HCL 200 MG PO TABS: 200.0000 mg | ORAL_TABLET | Freq: Three times a day (TID) | ORAL | Status: DC | PRN

## 2014-05-05 MED ORDER — CEFAZOLIN SODIUM-DEXTROSE 2-3 GM-% IV SOLR
2.0000 g | INTRAVENOUS | Status: DC
  Filled 2014-05-05: qty 50

## 2014-05-05 SURGICAL SUPPLY — 24 items
BAG DRAIN URO-CYSTO SKYTR STRL (DRAIN) ×3 IMPLANT
CANISTER SUCT LVC 12 LTR MEDI- (MISCELLANEOUS) ×3 IMPLANT
CATH INTERMIT  6FR 70CM (CATHETERS) ×3 IMPLANT
CLOTH BEACON ORANGE TIMEOUT ST (SAFETY) ×3 IMPLANT
DRAPE CAMERA CLOSED 9X96 (DRAPES) ×3 IMPLANT
GLOVE BIO SURGEON STRL SZ 6.5 (GLOVE) ×2 IMPLANT
GLOVE BIO SURGEON STRL SZ7.5 (GLOVE) ×3 IMPLANT
GLOVE BIO SURGEONS STRL SZ 6.5 (GLOVE) ×1
GLOVE BIOGEL PI IND STRL 6.5 (GLOVE) ×2 IMPLANT
GLOVE BIOGEL PI INDICATOR 6.5 (GLOVE) ×4
GOWN STRL REUS W/ TWL LRG LVL3 (GOWN DISPOSABLE) ×1 IMPLANT
GOWN STRL REUS W/ TWL XL LVL3 (GOWN DISPOSABLE) ×1 IMPLANT
GOWN STRL REUS W/TWL LRG LVL3 (GOWN DISPOSABLE) ×2
GOWN STRL REUS W/TWL XL LVL3 (GOWN DISPOSABLE) ×2
GUIDEWIRE ANG ZIPWIRE 038X150 (WIRE) ×3 IMPLANT
GUIDEWIRE STR DUAL SENSOR (WIRE) ×3 IMPLANT
IV NS 1000ML (IV SOLUTION) ×2
IV NS 1000ML BAXH (IV SOLUTION) ×1 IMPLANT
IV NS IRRIG 3000ML ARTHROMATIC (IV SOLUTION) ×3 IMPLANT
KIT BALLN UROMAX 15FX4 (MISCELLANEOUS) ×1 IMPLANT
KIT BALLN UROMAX 26 75X4 (MISCELLANEOUS) ×2
NS IRRIG 500ML POUR BTL (IV SOLUTION) ×3 IMPLANT
PACK CYSTO (CUSTOM PROCEDURE TRAY) ×3 IMPLANT
SYRINGE 10CC LL (SYRINGE) ×3 IMPLANT

## 2014-05-05 NOTE — Transfer of Care (Signed)
Immediate Anesthesia Transfer of Care Note  Patient: Treyon Wymore  Procedure(s) Performed: Procedure(s): CYSTOSCOPY/ RIGHT URETEROSCOPY/ RIGHT RETROGRADE PYLEGRAM WITH INTERPRETATION/ RIGHT URETERAL BALLOON DILATION OF UPJ OBSTRUCTION/ BIOPSY OF RIGHT RENAL PELVIC MASS (Right)  Patient Location: PACU  Anesthesia Type:General  Level of Consciousness: awake, alert  and oriented  Airway & Oxygen Therapy: Patient Spontanous Breathing and Patient connected to nasal cannula oxygen  Post-op Assessment: Report given to PACU RN  Post vital signs: Reviewed, BP up, given Labetolol 5 mg x 2, with diastolyic at baseline  Complications: No apparent anesthesia complications

## 2014-05-05 NOTE — Discharge Instructions (Addendum)
Renal Cell Cancer Renal cell cancer (kidney cancer) is a disease in which cancer cells form in the linings of tubules of the kidney. A cancer is an uncontrolled growth of cells and can occur anywhere in the body. Your kidneys are the organs which filter your blood and keep it clean by getting rid of waste products from your body in your urine. Urine passes from the kidneys into the bladder through long tubes called ureters. The bladder stores the urine until it is passed from the body through the tube which drains the bladder to the outside (urethra). SYMPTOMS  Early in the disease there may be no problems but as the disease worsens some of the problems seen are: 1. Blood in the urine. 2. Belly (abdominal) pain. 3. Decreased red blood cells (anemia). 4. A swelling in the belly. 5. Loss of appetite and weight loss. 6. Fever from unknown causes. DIAGNOSIS   Your caregiver will do a physical exam. This means they check you over.  Laboratory work may show problems (abnormalities) in the urine.  Plain X-rays and some specialized x-rays may be done. Some of these may include a CT scan. Sometimes an IVP (intravenous pyelogram) is done. In this test a dye is injected into a vein and pictures are then taken of the kidneys. The dye travels to the inside of the kidneys, ureters and bladder. Let your caregivers know if you are allergic to iodine or have had a past reaction to dyes used in X-rays. Other specialized x-rays sometimes taken are the MRI (magnetic resonance imaging) and PET scan (positron emission technology).  Angiography is sometimes done in which a dye is put into an artery leading to the kidney so the vessels surrounding the tumor or growth can be studied.  Your caregiver will explain the value of the various testing to you and why it is necessary and helpful. If some of the above tests show a tumor or growth, sometimes a needle biopsy is done to confirm this and find out what the growth is  made of. A fine needle aspiration (FNA) is used to remove a sliver of tissue from the kidney. This is done by sticking a needle through your skin and into the kidney. A specialist in looking at cells under the microscope (pathologist) then looks at the biopsy to determine what is wrong. The pathologist will check for cancer cells. Usually the previous tests mentioned have already given your surgeon enough information to know if an operation is needed. TREATMENT  You will want to discuss treatment choices with your caregivers and see what the best treatment for you is. This will depend on various factors including your age, other health problems and what stage your disease is in. All of this will play a part in your outcome. Some of the treatment choices are:  Surgery is the main treatment and chances of surviving without this are uncommon. Usually the entire kidney is removed if this is possible. This is called a radical nephrectomy. The surgeon removes your kidney, the small gland on top of the kidney (adrenal gland) and the fat surrounding the kidney. You have another adrenal gland on the other side so removing one is not a problem. Sometimes a partial nephrectomy is done in people with one kidney or people with cancer on both sides. This may help to avoid use of an artificial kidney (dialysis) as only part of a kidney is needed to filter your blood.  Arterial embolization is another treatment. With  this treatment a small catheter is threaded from your groin up into your kidney and material is injected into the artery supplying the tumor in the kidney. Without blood supply, the tumor dies off. Sometimes this procedure is used before kidney removal to cut down on blood loss.  Radiation therapy or x-ray therapy can be used if your health is poor and will not allow surgery. Some of the problems with radiation include fatigue, nausea, vomiting, and damage to skin and surrounding tissues.  Chemotherapy may be  used. This is a treatment which uses cancer killing medications to fight the cancer. The side effects depend on the medications used. Some side effects may include nausea, vomiting, loss of weight, loss of appetite, hair loss and other problems. Your caregiver can usually give you medications to overcome most of the problems.  There are many other forms of treatment your caregivers can discuss with you. Together you can determine which treatment will be best for you.  The more you know when dealing with these problems, the more comfortable you will be. Talk over your treatment over with your loved ones. Get a second opinion if you feel it will be of help. Often your surgeon and other caregivers may recommend this for your own comfort or peace of mind. Document Released: 02/10/2004 Document Revised: 06/24/2011 Document Reviewed: 01/21/2008 [facility] Patient Information 2015 [place], [place]. This information is not intended to replace advice given to you by your health care provider. Make sure you discuss any questions you have with your health care provider.  Post Anesthesia Home Care Instructions  Activity: Get plenty of rest for the remainder of the day. A responsible adult should stay with you for 24 hours following the procedure.  For the next 24 hours, DO NOT: -Drive a car -Paediatric nurse -Drink alcoholic beverages -Take any medication unless instructed by your physician -Make any legal decisions or sign important papers.  Meals: Start with liquid foods such as gelatin or soup. Progress to regular foods as tolerated. Avoid greasy, spicy, heavy foods. If nausea and/or vomiting occur, drink only clear liquids until the nausea and/or vomiting subsides. Call your physician if vomiting continues.  Special Instructions/Symptoms: Your throat may feel dry or sore from the anesthesia or the breathing tube placed in your throat during surgery. If this causes discomfort, gargle with warm salt  water. The discomfort should disappear within 24 hours. [facility] [phone] Post Ureteroscopy With or Without Stent Instructions  Definitions:  Ureter: The duct that transports urine from the kidney to the bladder. Stent:   A plastic hollow tube that is placed into the ureter, from the kidney to the                 bladder to prevent the ureter from swelling shut.  GENERAL INSTRUCTIONS:  Despite the fact that no skin incisions were used, the area around the ureter and bladder is raw and irritated.  This irritation is manifested by increased frequency of urination, both day and night, and by an increase in the urge to urinate. In some, the urge to urinate is present almost always. Sometimes the urge is strong enough that you may not be able to stop yourself from urinating. The only real cure is to give time for the bladder to heal . You may see some blood in your urine . Do not be alarmed, even if the urine was clear for a while. Get off your feet and drink lots of fluids until clearing  occurs. If you start to pass clots or don't improve, call us.  DIET: You may return to your normal diet immediately. Because of the raw surface of your bladder, alcohol, spicy foods, acid type foods and drinks with caffeine may cause irritation or frequency and should be used in moderation. To keep your urine flowing freely and to avoid constipation, drink plenty of fluids during the day ( 8-10 glasses ). Tip: Avoid cranberry juice because it is very acidic.  ACTIVITY: Your physical activity doesn't need to be restricted. However, if you are very active, you may see some blood in your urine. We suggest that you reduce your activity under these circumstances until the bleeding has stopped.  BOWELS: It is important to keep your bowels regular during the postoperative period. Straining with bowel movements can cause bleeding. A bowel movement every other day is reasonable. Use a mild laxative  if needed, such as Milk of Magnesia 2-3 tablespoons, or 2 Dulcolax tablets. Call if you continue to have problems. If you have been taking narcotics for pain, before, during or after your surgery, you may be constipated. Take a laxative if necessary.   MEDICATION: You should resume your pre-surgery medications unless told not to. In addition you will often be given an antibiotic to prevent infection. These should be taken as prescribed until the bottles are finished unless you are having an unusual reaction to one of the drugs.  PROBLEMS YOU SHOULD REPORT TO [place]:  Fevers over 100.5 Fahrenheit.  Heavy bleeding, or clots ( See above notes about blood in urine ).  Inability to urinate.  Drug reactions ( hives, rash, nausea, vomiting, diarrhea ).  Severe burning or pain with urination that is not improving.  FOLLOW-UP: You will need a follow-up appointment to monitor your progress. Call for this appointment at the number listed above. Usually the first appointment will be about three to fourteen days after your surgery.

## 2014-05-05 NOTE — Interval H&P Note (Signed)
History and Physical Interval Note:  05/05/2014 9:30 AM  Timothy Hickman  has presented today for surgery, with the diagnosis of RIGHT RENAL PELVIC MASS  The various methods of treatment have been discussed with the patient and family. After consideration of risks, benefits and other options for treatment, the patient has consented to  Procedure(s): FLEXIBLE DIGITAL RIGHT URETERAL URETEROSCOPY/BIOPSY OF RENAL PELVIC MASS/POSSIBLE JJ STENT (Right) as a surgical intervention .  The patient's history has been reviewed, patient examined, no change in status, stable for surgery.  I have reviewed the patient's chart and labs.  Questions were answered to the patient's satisfaction.     Carolan Clines I

## 2014-05-05 NOTE — Interval H&P Note (Signed)
History and Physical Interval Note:  05/05/2014 9:31 AM  Timothy Hickman  has presented today for surgery, with the diagnosis of RIGHT RENAL PELVIC MASS  The various methods of treatment have been discussed with the patient and family. After consideration of risks, benefits and other options for treatment, the patient has consented to  Procedure(s): FLEXIBLE DIGITAL RIGHT URETERAL URETEROSCOPY/BIOPSY OF RENAL PELVIC MASS/POSSIBLE JJ STENT (Right) as a surgical intervention .  The patient's history has been reviewed, patient examined, no change in status, stable for surgery.  I have reviewed the patient's chart and labs.  Questions were answered to the patient's satisfaction.     Carolan Clines I

## 2014-05-05 NOTE — Anesthesia Preprocedure Evaluation (Addendum)
Anesthesia Evaluation  Patient identified by MRN, date of birth, ID band Patient awake    Reviewed: Allergy & Precautions, NPO status , Patient's Chart, lab work & pertinent test results  Airway Mallampati: II  TM Distance: >3 FB Neck ROM: Full    Dental   Currently on penicillin for left upper rear dental infection. He may have lost a filling he thinks.:   Pulmonary Current Smoker,  breath sounds clear to auscultation  Pulmonary exam normal       Cardiovascular negative cardio ROS  Rhythm:Regular Rate:Normal     Neuro/Psych negative neurological ROS  negative psych ROS   GI/Hepatic negative GI ROS, Neg liver ROS,   Endo/Other  negative endocrine ROS  Renal/GU negative Renal ROS  negative genitourinary   Musculoskeletal negative musculoskeletal ROS (+)   Abdominal (+) + obese,   Peds negative pediatric ROS (+)  Hematology negative hematology ROS (+)   Anesthesia Other Findings   Reproductive/Obstetrics negative OB ROS                            Anesthesia Physical Anesthesia Plan  ASA: II  Anesthesia Plan: General   Post-op Pain Management:    Induction: Intravenous  Airway Management Planned: LMA  Additional Equipment:   Intra-op Plan:   Post-operative Plan: Extubation in OR  Informed Consent: I have reviewed the patients History and Physical, chart, labs and discussed the procedure including the risks, benefits and alternatives for the proposed anesthesia with the patient or authorized representative who has indicated his/her understanding and acceptance.   Dental advisory given  Plan Discussed with: CRNA  Anesthesia Plan Comments:         Anesthesia Quick Evaluation

## 2014-05-05 NOTE — H&P (Signed)
Reason For Visit Renal mass   History of Present Illness     44 yo male referred by Dr. Tomi Bamberger, ER physician for further evaluation of a 4.1cm Rt renal mass. Patient presented to the ER on 01/28/14 for increasing frequency & abdominal pain. A foley was placed & had about 30cc of red tinged urine. Hx of smoking 1 1/2 ppd x 20 yrs.No gross hematuria.   Past Medical History Problems  1. History of cardiac murmur (Z86.79) 2. History of hypertension (Z86.79) 3. History of Obstructive sleep apnea, adult (G47.33)  Surgical History Problems  1. History of No Surgical Problems  Current Meds 1. Hydrocodone-Acetaminophen 5-325 MG Oral Tablet;  Therapy: (Recorded:21Oct2015) to Recorded 2. Pyridium 200 MG Oral Tablet;  Therapy: (Recorded:21Oct2015) to Recorded  Allergies Medication  1. Valium TABS  Family History Problems  1. Family history of kidney stones (Z84.1)  Social History Problems    Alcohol use (F10.99)   Caffeine use (F15.90)   Current every day smoker (F17.200)   Number of children   Occupation   Single   Smokes cigarettes (F17.210)  Review of Systems Genitourinary, constitutional, skin, eye, otolaryngeal, hematologic/lymphatic, cardiovascular, pulmonary, endocrine, musculoskeletal, gastrointestinal, neurological and psychiatric system(s) were reviewed and pertinent findings if present are noted.  Genitourinary: urinary frequency, dysuria, weak urinary stream and hematuria.    Vitals Vital Signs [Data Includes: Last 1 Day]  Recorded: 21Oct2015 03:57PM  Height: 5 ft 7 in Weight: 215 lb  BMI Calculated: 33.67 BSA Calculated: 2.09 Blood Pressure: 165 / 86 Heart Rate: 89  Physical Exam Constitutional: Well nourished and well developed . No acute distress.  ENT:. The ears and nose are normal in appearance.  Neck: The appearance of the neck is normal and no neck mass is present.  Pulmonary: No respiratory distress and normal respiratory rhythm and effort.   Abdomen: The abdomen is soft and nontender. No masses are palpated. No CVA tenderness. No hernias are palpable. No hepatosplenomegaly noted.  Genitourinary: Examination of the penis demonstrates no discharge, no masses, no lesions and a normal meatus. The scrotum is without lesions. The right epididymis is palpably normal and non-tender. The left epididymis is palpably normal and non-tender. The right testis is non-tender and without masses. The left testis is non-tender and without masses.  Lymphatics: The femoral and inguinal nodes are not enlarged or tender.  Skin: Normal skin turgor, no visible rash and no visible skin lesions.  Neuro/Psych:. Mood and affect are appropriate.    Results/Data Urine [Data Includes: Last 1 Day]   21Oct2015  COLOR YELLOW   APPEARANCE CLEAR   SPECIFIC GRAVITY 1.020   pH 6.0   GLUCOSE NEG mg/dL  BILIRUBIN NEG   KETONE NEG mg/dL  BLOOD MOD   PROTEIN NEG mg/dL  UROBILINOGEN 0.2 mg/dL  NITRITE NEG   LEUKOCYTE ESTERASE SMALL   SQUAMOUS EPITHELIAL/HPF FEW   WBC 7-10 WBC/hpf  RBC 21-50 RBC/hpf  BACTERIA FEW   CRYSTALS NONE SEEN   CASTS NONE SEEN    Assessment Assessed  1. 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 UA With REFLEX; [Do Not Release]; Status:Resulted - Requires Verification;   Done:  21Oct2015 03:25PM Transitional cell carcinoma of renal pelvis, unspecified laterality  2. Changed: From  Hydrocodone-Acetaminophen 5-325 MG Oral Tablet  To  Hydrocodone-Acetaminophen 5-325 MG Oral Tablet Take 1-2 tablets every 4-6 hours for  pain 3. Renew: Pyridium 200 MG Oral Tablet; TAKE 1 TABLET 3 TIMES DAILY AS  NEEDED FOR  PAIN  1. urine cytology/fish today   2. Stop smoking  3.schedule surgery for ureteroscopy and renal pelvis bx.   URINE CYTOLOGY W/REFLEX FISH; [Do Not Release]; Status:In Progress - Specimen/Data Collected;  Done: 21Oct2015  Perform:[facility]; Due:26Oct2015; Last  Updated YQ:IHKVQQ, Debbie; 02/02/2014 5:01:31 PM;Ordered; Today;   VZD:[id number] cell carcinoma of renal pelvis, unspecified laterality; Ordered JO:[email or web address], Johnathon Mittal;   Electronics engineer signed by : Carolan Clines, M.D.; Feb 02 2014  5:09PM EST  Reason For Visit Renal mass   History of Present Illness     44 yo male referred by Dr. Tomi Bamberger, ER physician for further evaluation of a 4.1cm Rt renal mass. Patient presented to the ER on 01/28/14 for increasing frequency & abdominal pain. A foley was placed & had about 30cc of red tinged urine. Hx of smoking 1 1/2 ppd x 20 yrs.No gross hematuria.   Past Medical History Problems  1. History of cardiac murmur (Z86.79) 2. History of hypertension (Z86.79) 3. History of Obstructive sleep apnea, adult (G47.33)  Surgical History Problems  1. History of No Surgical Problems  Current Meds 1. Hydrocodone-Acetaminophen 5-325 MG Oral Tablet;  Therapy: (Recorded:21Oct2015) to Recorded 2. Pyridium 200 MG Oral Tablet;  Therapy: (Recorded:21Oct2015) to Recorded  Allergies Medication  1. Valium TABS  Family History Problems  1. Family history of kidney stones (Z84.1)  Social History Problems    Alcohol use (F10.99)   Caffeine use (F15.90)   Current every day smoker (F17.200)   Number of children   Occupation   Single   Smokes cigarettes (F17.210)  Review of Systems Genitourinary, constitutional, skin, eye, otolaryngeal, hematologic/lymphatic, cardiovascular, pulmonary, endocrine, musculoskeletal, gastrointestinal, neurological and psychiatric system(s) were reviewed and pertinent findings if present are noted.  Genitourinary: urinary frequency, dysuria, weak urinary stream and hematuria.    Vitals Vital Signs [Data Includes: Last 1 Day]  Recorded: 21Oct2015 03:57PM  Height: 5 ft 7 in Weight: 215 lb  BMI Calculated: 33.67 BSA Calculated: 2.09 Blood Pressure: 165 / 86 Heart Rate: 89  Physical  Exam Constitutional: Well nourished and well developed . No acute distress.  ENT:. The ears and nose are normal in appearance.  Neck: The appearance of the neck is normal and no neck mass is present.  Pulmonary: No respiratory distress and normal respiratory rhythm and effort.  Abdomen: The abdomen is soft and nontender. No masses are palpated. No CVA tenderness. No hernias are palpable. No hepatosplenomegaly noted.  Genitourinary: Examination of the penis demonstrates no discharge, no masses, no lesions and a normal meatus. The scrotum is without lesions. The right epididymis is palpably normal and non-tender. The left epididymis is palpably normal and non-tender. The right testis is non-tender and without masses. The left testis is non-tender and without masses.  Lymphatics: The femoral and inguinal nodes are not enlarged or tender.  Skin: Normal skin turgor, no visible rash and no visible skin lesions.  Neuro/Psych:. Mood and affect are appropriate.    Results/Data Urine [Data Includes: Last 1 Day]   21Oct2015  COLOR YELLOW   APPEARANCE CLEAR   SPECIFIC GRAVITY 1.020   pH 6.0   GLUCOSE NEG mg/dL  BILIRUBIN NEG   KETONE NEG mg/dL  BLOOD MOD   PROTEIN NEG mg/dL  UROBILINOGEN 0.2 mg/dL  NITRITE NEG   LEUKOCYTE ESTERASE SMALL   SQUAMOUS EPITHELIAL/HPF FEW   WBC 7-10 WBC/hpf  RBC 21-50 RBC/hpf  BACTERIA FEW   CRYSTALS NONE SEEN   CASTS NONE SEEN    Assessment Assessed  1. 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 UA With REFLEX; [Do Not Release]; Status:Resulted - Requires Verification;   Done:  21Oct2015 03:25PM Transitional cell carcinoma of renal pelvis, unspecified laterality  2. Changed: From  Hydrocodone-Acetaminophen 5-325 MG Oral Tablet  To  Hydrocodone-Acetaminophen 5-325 MG Oral Tablet Take 1-2 tablets every 4-6 hours for  pain 3. Renew: Pyridium 200 MG Oral Tablet; TAKE 1 TABLET 3 TIMES DAILY AS  NEEDED FOR  PAIN  1. urine cytology/fish today   2. Stop smoking  3.schedule surgery for ureteroscopy and renal pelvis bx.   URINE CYTOLOGY W/REFLEX FISH; [Do Not Release]; Status:In Progress - Specimen/Data Collected;  Done: 21Oct2015  Perform:[facility]; Due:26Oct2015; Last Updated HM:CNOBSJ, Debbie; 02/02/2014 5:01:31 PM;Ordered; Today;   GGE:[id number] cell carcinoma of renal pelvis, unspecified laterality; Ordered KP:[id number], Honestee Revard;   Electronics engineer signed by : Carolan Clines, M.D.; Feb 02 2014  5:09PM EST  Reason For Visit Renal mass   History of Present Illness     44 yo male referred by Dr. Tomi Bamberger, ER physician for further evaluation of a 4.1cm Rt renal mass. Patient presented to the ER on 01/28/14 for increasing frequency & abdominal pain. A foley was placed & had about 30cc of red tinged urine. Hx of smoking 1 1/2 ppd x 20 yrs.No gross hematuria.   Past Medical History Problems  1. History of cardiac murmur (Z86.79) 2. History of hypertension (Z86.79) 3. History of Obstructive sleep apnea, adult (G47.33)  Surgical History Problems  1. History of No Surgical Problems  Current Meds 1. Hydrocodone-Acetaminophen 5-325 MG Oral Tablet;  Therapy: (Recorded:21Oct2015) to Recorded 2. Pyridium 200 MG Oral Tablet;  Therapy: (Recorded:21Oct2015) to Recorded  Allergies Medication  1. Valium TABS  Family History Problems  1. Family history of kidney stones (Z84.1)  Social History Problems    Alcohol use (F10.99)   Caffeine use (F15.90)   Current every day smoker (F17.200)   Number of children   Occupation   Single   Smokes cigarettes (F17.210)  Review of Systems Genitourinary, constitutional, skin, eye, otolaryngeal, hematologic/lymphatic, cardiovascular, pulmonary, endocrine, musculoskeletal, gastrointestinal, neurological and psychiatric system(s) were reviewed and pertinent findings if present are noted.  Genitourinary: urinary  frequency, dysuria, weak urinary stream and hematuria.    Vitals Vital Signs [Data Includes: Last 1 Day]  Recorded: 21Oct2015 03:57PM  Height: 5 ft 7 in Weight: 215 lb  BMI Calculated: 33.67 BSA Calculated: 2.09 Blood Pressure: 165 / 86 Heart Rate: 89  Physical Exam Constitutional: Well nourished and well developed . No acute distress.  ENT:. The ears and nose are normal in appearance.  Neck: The appearance of the neck is normal and no neck mass is present.  Pulmonary: No respiratory distress and normal respiratory rhythm and effort.  Abdomen: The abdomen is soft and nontender. No masses are palpated. No CVA tenderness. No hernias are palpable. No hepatosplenomegaly noted.  Genitourinary: Examination of the penis demonstrates no discharge, no masses, no lesions and a normal meatus. The scrotum is without lesions. The right epididymis is palpably normal and non-tender. The left epididymis is palpably normal and non-tender. The right testis is non-tender and without masses. The left testis is non-tender and without masses.  Lymphatics: The femoral and inguinal nodes are not enlarged or tender.  Skin: Normal skin turgor, no visible rash and no visible skin lesions.  Neuro/Psych:. Mood and affect are appropriate.    Results/Data Urine [Data Includes: Last 1 Day]   21Oct2015  COLOR YELLOW   APPEARANCE CLEAR   SPECIFIC GRAVITY 1.020   pH 6.0   GLUCOSE NEG mg/dL  BILIRUBIN NEG   KETONE NEG mg/dL  BLOOD MOD   PROTEIN NEG mg/dL  UROBILINOGEN 0.2 mg/dL  NITRITE NEG   LEUKOCYTE ESTERASE SMALL   SQUAMOUS EPITHELIAL/HPF FEW   WBC 7-10 WBC/hpf  RBC 21-50 RBC/hpf  BACTERIA FEW   CRYSTALS NONE SEEN   CASTS NONE SEEN    Assessment Assessed  1. 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 UA With REFLEX; [Do Not Release]; Status:Resulted - Requires Verification;   Done:  21Oct2015 03:25PM Transitional cell carcinoma of  renal pelvis, unspecified laterality  2. Changed: From  Hydrocodone-Acetaminophen 5-325 MG Oral Tablet  To  Hydrocodone-Acetaminophen 5-325 MG Oral Tablet Take 1-2 tablets every 4-6 hours for  pain 3. Renew: Pyridium 200 MG Oral Tablet; TAKE 1 TABLET 3 TIMES DAILY AS NEEDED FOR  PAIN  1. urine cytology/fish today   2. Stop smoking  3.schedule surgery for ureteroscopy and renal pelvis bx.   URINE CYTOLOGY W/REFLEX FISH; [Do Not Release]; Status:In Progress - Specimen/Data Collected;  Done: 21Oct2015  Perform:[facility]; Due:26Oct2015; Last Updated XI:PJASNK, Debbie; 02/02/2014 5:01:31 PM;Ordered; Today;   NLZ:[id number] cell carcinoma of renal pelvis, unspecified laterality; Ordered IO:[id number], Ashima Shrake;   Electronics engineer signed by : Carolan Clines, M.D.; Feb 02 2014  5:09PM EST

## 2014-05-05 NOTE — Op Note (Signed)
Pre-operative diagnosis :   Right renal pelvic mass, with right ureteropelvic junction obstruction  Postoperative diagnosis:  Same  Operation:  Cystourethroscopy, right retrograde pyelogram with interpretation, right ureteroscopy, flexible, with balloon dilation of right ureteropelvic junction, and cold cup biopsy of right renal pelvic mass  Surgeon:  S. Gaynelle Arabian, MD  First assistant: None    Anesthesia:  General LMA   Preparation:  After appropriate preanesthesia, the patient was brought the operative room, placed on the operating table in the dorsal supine position where general LMA anesthesia was introduced. He was then replaced in the dorsal lithotomy position with pubis was prepped with Betadine solution and draped in usual fashion. The armband was double checked. CT scan was reviewed.   Review history:    44 yo male referred by Dr. Tomi Bamberger, ER physician for further evaluation of a 4.1cm Rt renal mass. Patient presented to the ER on 01/28/14 for increasing frequency & abdominal pain. A foley was placed & had about 30cc of red tinged urine. Hx of smoking 1 1/2 ppd x 20 yrs.No gross hematuria.   Past Medical History Problems  1. History of cardiac murmur (Z86.79) 2. History of hypertension (Z86.79) 3. History of Obstructive sleep apnea, adult (G47.33)  Surgical History Problems  1. History of No Surgical Problems  Current Meds 1. Hydrocodone-Acetaminophen 5-325 MG Oral Tablet; Therapy: (Recorded:21Oct2015) to Recorded 2. Pyridium 200 MG Oral Tablet; Therapy: (Recorded:21Oct2015) to Recorded   Statement of  Likelihood of Success: Excellent. TIME-OUT observed.:  Procedure:  Cystourethroscopy was accomplished, and showed normal-appearing bladder. BPH was noted, with elevation of the bladder neck, and mild enlargement of the prostatic lateral lobes bilaterally. Within the bladder, the trigone appeared to be normal. There was no tracheal A, no cellule formation. There was no  diverticular formation and no stone formation. There was no bladder tumor formation.  Right retropyelogram was performed, which showed normal-appearing ureter. 2 wires were placed into the right renal pelvis. There appeared to be a mass in the right renal pelvis on retrograde pyelogram. This correlated with the large mass identified on CT scan.  The A[facility] ureteral dilating sheath was then passed to the upper ureter. Through this, the dual channel flexible ureteroscope was passed. It could not be passed above the UPJ obstruction, however. Therefore, a guidewire was passed through the ureteroscope, and over the guidewire, under fluoroscopic control, balloon dilation of the UPJ was obtaining obtained.  The flexor uteroscope was again passed, and through this, 2 separate cold cup biopsies were taken of the ureteral and UPJ and renal pelvic mass. This appeared to be typical transitional cell carcinoma. Minimal bleeding was noted. Following biopsy, the ureteroscope was removed, and the ureteral dilating sheath was removed. In addition, the guidewires were removed. I elected to not place a double-J stent. The patient received IV Tylenol and IV Toradol. He also received a B and O's was very. He was awakened and taken to recovery room in good condition.

## 2014-05-05 NOTE — Anesthesia Procedure Notes (Signed)
Procedure Name: LMA Insertion Date/Time: 05/05/2014 9:33 AM Performed by: Bethena Roys T Pre-anesthesia Checklist: Patient identified, Emergency Drugs available, Suction available and Patient being monitored Patient Re-evaluated:Patient Re-evaluated prior to inductionOxygen Delivery Method: Circle System Utilized Preoxygenation: Pre-oxygenation with 100% oxygen Intubation Type: IV induction Ventilation: Mask ventilation without difficulty LMA: LMA inserted LMA Size: 5.0 Number of attempts: 1 Airway Equipment and Method: Bite block Placement Confirmation: positive ETCO2 Dental Injury: Teeth and Oropharynx as per pre-operative assessment

## 2014-05-06 ENCOUNTER — Inpatient Hospital Stay (HOSPITAL_COMMUNITY)
Admission: EM | Admit: 2014-05-06 | Discharge: 2014-05-08 | DRG: 690 | Disposition: A | Discharge status: Home / Self Care | Payer: No Typology Code available for payment source | Attending: Urology | Admitting: Urology

## 2014-05-06 ENCOUNTER — Emergency Department (HOSPITAL_COMMUNITY): Payer: No Typology Code available for payment source

## 2014-05-06 ENCOUNTER — Encounter (HOSPITAL_BASED_OUTPATIENT_CLINIC_OR_DEPARTMENT_OTHER): Payer: Self-pay | Admitting: Urology

## 2014-05-06 ENCOUNTER — Inpatient Hospital Stay (HOSPITAL_COMMUNITY): Payer: No Typology Code available for payment source

## 2014-05-06 DIAGNOSIS — R059 Cough, unspecified: Secondary | ICD-10-CM

## 2014-05-06 DIAGNOSIS — N23 Unspecified renal colic: Secondary | ICD-10-CM | POA: Diagnosis present

## 2014-05-06 DIAGNOSIS — Z792 Long term (current) use of antibiotics: Secondary | ICD-10-CM | POA: Diagnosis not present

## 2014-05-06 DIAGNOSIS — R05 Cough: Secondary | ICD-10-CM

## 2014-05-06 DIAGNOSIS — I10 Essential (primary) hypertension: Secondary | ICD-10-CM | POA: Diagnosis present

## 2014-05-06 DIAGNOSIS — F17210 Nicotine dependence, cigarettes, uncomplicated: Secondary | ICD-10-CM | POA: Diagnosis present

## 2014-05-06 DIAGNOSIS — M549 Dorsalgia, unspecified: Secondary | ICD-10-CM | POA: Diagnosis present

## 2014-05-06 DIAGNOSIS — Z888 Allergy status to other drugs, medicaments and biological substances status: Secondary | ICD-10-CM

## 2014-05-06 DIAGNOSIS — D495 Neoplasm of unspecified behavior of other genitourinary organs: Secondary | ICD-10-CM | POA: Diagnosis present

## 2014-05-06 DIAGNOSIS — Z8709 Personal history of other diseases of the respiratory system: Secondary | ICD-10-CM

## 2014-05-06 DIAGNOSIS — R109 Unspecified abdominal pain: Secondary | ICD-10-CM | POA: Diagnosis present

## 2014-05-06 DIAGNOSIS — Z803 Family history of malignant neoplasm of breast: Secondary | ICD-10-CM | POA: Diagnosis not present

## 2014-05-06 DIAGNOSIS — Z830 Family history of human immunodeficiency virus [HIV] disease: Secondary | ICD-10-CM

## 2014-05-06 DIAGNOSIS — Z79899 Other long term (current) drug therapy: Secondary | ICD-10-CM

## 2014-05-06 DIAGNOSIS — R609 Edema, unspecified: Secondary | ICD-10-CM | POA: Diagnosis present

## 2014-05-06 DIAGNOSIS — N135 Crossing vessel and stricture of ureter without hydronephrosis: Secondary | ICD-10-CM | POA: Diagnosis present

## 2014-05-06 DIAGNOSIS — D72829 Elevated white blood cell count, unspecified: Secondary | ICD-10-CM | POA: Diagnosis present

## 2014-05-06 DIAGNOSIS — Z79891 Long term (current) use of opiate analgesic: Secondary | ICD-10-CM

## 2014-05-06 DIAGNOSIS — G4733 Obstructive sleep apnea (adult) (pediatric): Secondary | ICD-10-CM | POA: Diagnosis present

## 2014-05-06 DIAGNOSIS — S37019A Minor contusion of unspecified kidney, initial encounter: Secondary | ICD-10-CM | POA: Diagnosis present

## 2014-05-06 DIAGNOSIS — N2889 Other specified disorders of kidney and ureter: Secondary | ICD-10-CM | POA: Insufficient documentation

## 2014-05-06 LAB — BASIC METABOLIC PANEL
Anion gap: 10 (ref 5–15)
BUN: 8 mg/dL (ref 6–23)
CALCIUM: 8.4 mg/dL (ref 8.4–10.5)
CHLORIDE: 101 mmol/L (ref 96–112)
CO2: 23 mmol/L (ref 19–32)
Creatinine, Ser: 0.99 mg/dL (ref 0.50–1.35)
GFR calc Af Amer: >90 mL/min (ref >90)
GFR calc non Af Amer: >90 mL/min (ref >90)
Glucose, Bld: 107 mg/dL — ABNORMAL HIGH (ref 70–99)
Potassium: 3.7 mmol/L (ref 3.5–5.1)
Sodium: 134 mmol/L — ABNORMAL LOW (ref 135–145)

## 2014-05-06 LAB — URINALYSIS, ROUTINE W REFLEX MICROSCOPIC
Bilirubin Urine: NEGATIVE
Glucose, UA: NEGATIVE mg/dL
Ketones, ur: NEGATIVE mg/dL
LEUKOCYTES UA: NEGATIVE
Nitrite: NEGATIVE
PH: 6.5 (ref 5.0–8.0)
Protein, ur: NEGATIVE mg/dL
Specific Gravity, Urine: 1.018 (ref 1.005–1.030)
UROBILINOGEN UA: 0.2 mg/dL (ref 0.0–1.0)

## 2014-05-06 LAB — CBC WITH DIFFERENTIAL/PLATELET
BASOS ABS: 0 10*3/uL (ref 0.0–0.1)
Basophils Relative: 0 % (ref 0–1)
Eosinophils Absolute: 0.2 10*3/uL (ref 0.0–0.7)
Eosinophils Relative: 1 % (ref 0–5)
HCT: 42.9 % (ref 39.0–52.0)
HEMOGLOBIN: 14.5 g/dL (ref 13.0–17.0)
LYMPHS ABS: 1.4 10*3/uL (ref 0.7–4.0)
Lymphocytes Relative: 9 % — ABNORMAL LOW (ref 12–46)
MCH: 31.2 pg (ref 26.0–34.0)
MCHC: 33.8 g/dL (ref 30.0–36.0)
MCV: 92.3 fL (ref 78.0–100.0)
MONO ABS: 0.9 10*3/uL (ref 0.1–1.0)
Monocytes Relative: 6 % (ref 3–12)
NEUTROS PCT: 84 % — AB (ref 43–77)
Neutro Abs: 12.5 10*3/uL — ABNORMAL HIGH (ref 1.7–7.7)
Platelets: 291 10*3/uL (ref 150–400)
RBC: 4.65 MIL/uL (ref 4.22–5.81)
RDW: 13.5 % (ref 11.5–15.5)
WBC: 15 10*3/uL — ABNORMAL HIGH (ref 4.0–10.5)

## 2014-05-06 LAB — URINE MICROSCOPIC-ADD ON

## 2014-05-06 MED ORDER — HYDROMORPHONE HCL 2 MG/ML IJ SOLN
2.0000 mg | Freq: Once | INTRAMUSCULAR | Status: AC
  Administered 2014-05-06: 2 mg via INTRAVENOUS
  Filled 2014-05-06: qty 1

## 2014-05-06 MED ORDER — MORPHINE SULFATE 4 MG/ML IJ SOLN
4.0000 mg | Freq: Once | INTRAMUSCULAR | Status: AC
  Administered 2014-05-06: 4 mg via INTRAVENOUS
  Filled 2014-05-06: qty 1

## 2014-05-06 MED ORDER — CIPROFLOXACIN HCL 500 MG PO TABS
500.0000 mg | ORAL_TABLET | Freq: Two times a day (BID) | ORAL | Status: DC
  Administered 2014-05-07 – 2014-05-08 (×3): 500 mg via ORAL
  Filled 2014-05-06 (×5): qty 1

## 2014-05-06 MED ORDER — ACETAMINOPHEN 650 MG RE SUPP: 650.0000 mg | Freq: Four times a day (QID) | RECTAL | Status: DC | PRN

## 2014-05-06 MED ORDER — SODIUM CHLORIDE 0.9 % IV SOLN
INTRAVENOUS | Status: DC
  Administered 2014-05-06: via INTRAVENOUS

## 2014-05-06 MED ORDER — ALBUTEROL SULFATE (2.5 MG/3ML) 0.083% IN NEBU: 2.5000 mg | INHALATION_SOLUTION | RESPIRATORY_TRACT | Status: DC | PRN

## 2014-05-06 MED ORDER — ONDANSETRON HCL 4 MG/2ML IJ SOLN: 4.0000 mg | Freq: Four times a day (QID) | INTRAMUSCULAR | Status: DC | PRN

## 2014-05-06 MED ORDER — ACETAMINOPHEN 325 MG PO TABS: 650.0000 mg | ORAL_TABLET | Freq: Four times a day (QID) | ORAL | Status: DC | PRN

## 2014-05-06 MED ORDER — ONDANSETRON HCL 4 MG PO TABS: 4.0000 mg | ORAL_TABLET | Freq: Four times a day (QID) | ORAL | Status: DC | PRN

## 2014-05-06 MED ORDER — SODIUM CHLORIDE 0.9 % IV BOLUS (SEPSIS)
1000.0000 mL | Freq: Once | INTRAVENOUS | Status: AC
  Administered 2014-05-06: 1000 mL via INTRAVENOUS

## 2014-05-06 MED ORDER — DOCUSATE SODIUM 100 MG PO CAPS
100.0000 mg | ORAL_CAPSULE | Freq: Two times a day (BID) | ORAL | Status: DC
  Administered 2014-05-07: 100 mg via ORAL
  Filled 2014-05-06 (×5): qty 1

## 2014-05-06 MED ORDER — OXYCODONE HCL 5 MG PO TABS
5.0000 mg | ORAL_TABLET | ORAL | Status: DC | PRN
  Administered 2014-05-07: 5 mg via ORAL
  Filled 2014-05-06: qty 1

## 2014-05-06 MED ORDER — HYDROMORPHONE HCL 1 MG/ML IJ SOLN
1.0000 mg | INTRAMUSCULAR | Status: DC | PRN
  Administered 2014-05-06 – 2014-05-08 (×7): 2 mg via INTRAVENOUS
  Filled 2014-05-06 (×7): qty 2

## 2014-05-06 NOTE — Anesthesia Postprocedure Evaluation (Signed)
  Anesthesia Post-op Note  Patient: Timothy Hickman  Procedure(s) Performed: Procedure(s) (LRB): CYSTOSCOPY/ RIGHT URETEROSCOPY/ RIGHT RETROGRADE PYLEGRAM WITH INTERPRETATION/ RIGHT URETERAL BALLOON DILATION OF UPJ OBSTRUCTION/ BIOPSY OF RIGHT RENAL PELVIC MASS (Right)  Patient Location: PACU  Anesthesia Type: General  Level of Consciousness: awake and alert   Airway and Oxygen Therapy: Patient Spontanous Breathing  Post-op Pain: mild  Post-op Assessment: Post-op Vital signs reviewed, Patient's Cardiovascular Status Stable, Respiratory Function Stable, Patent Airway and No signs of Nausea or vomiting  Last Vitals:  Filed Vitals:   05/05/14 1240  BP: 149/86  Pulse: 7  Temp: 36.3 C  Resp: 12    Post-op Vital Signs: stable   Complications: No apparent anesthesia complications

## 2014-05-06 NOTE — ED Notes (Signed)
Hospitalist at bedside 

## 2014-05-06 NOTE — H&P (Signed)
PCP: No PCP Per Patient    Chief Complaint:  Back pain after renal biopsy  HPI: Timothy Hickman is a 44 y.o. male   has a past medical history of Right renal mass; Borderline hypertension; Bronchitis; and Hematuria.   On  01/28/14 patient was seen in ER for increasing frequency & abdominal pain he was found to have 4.1cm Right renal mass.  Patient was referred to Urology and 05/05/14 had a right renal biopsy done by Dr. Gaynelle Arabian. He presented to ER tonight with severe back pain not controlled by tramadol that he was given pos-op. Patient endorsing decreased urine output. CT showed 3.2 x 2.3 cm hematoma in the posterior midpole right kidney with blood extending to the right renal pelvis and proximal right ureter. In ER patient was noted to be hypertensive up to 214/114. WBC 15 no evidence of UTI. Patient was prescribed cipro post op but did not take because his mother already gave him Penicillin for bronchitis.  Urology is aware and will see in AM. For now recommending IVF and pain management.   Hospitalist was called for admission for uncontrolled back pain due to perinephric hematoma.   Review of Systems:    Pertinent positives include:  Back pain non-productive cough,  Constitutional:  No weight loss, night sweats, Fevers, chills, fatigue, weight loss  HEENT:  No headaches, Difficulty swallowing,Tooth/dental problems,Sore throat,  No sneezing, itching, ear ache, nasal congestion, post nasal drip,  Cardio-vascular:  No chest pain, Orthopnea, PND, anasarca, dizziness, palpitations.no Bilateral lower extremity swelling  GI:  No heartburn, indigestion, abdominal pain, nausea, vomiting, diarrhea, change in bowel habits, loss of appetite, melena, blood in stool, hematemesis Resp:  no shortness of breath at rest. No dyspnea on exertion, No excess mucus, no productive cough, No  No coughing up of blood.No change in color of mucus.No wheezing. Skin:  no rash or lesions. No jaundice GU:    no dysuria, change in color of urine, no urgency or frequency. No straining to urinate.  No flank pain.  Musculoskeletal:  No joint pain or no joint swelling. No decreased range of motion. No back pain.  Psych:  No change in mood or affect. No depression or anxiety. No memory loss.  Neuro: no localizing neurological complaints, no tingling, no weakness, no double vision, no gait abnormality, no slurred speech, no confusion  Otherwise ROS are negative except for above, 10 systems were reviewed  Past Medical History: Past Medical History  Diagnosis Date  . Right renal mass   . Borderline hypertension   . Bronchitis   . Hematuria    Past Surgical History  Procedure Laterality Date  . Cystoscopy with ureteroscopy Right 05/05/2014    Procedure: CYSTOSCOPY/ RIGHT URETEROSCOPY/ RIGHT RETROGRADE PYLEGRAM WITH INTERPRETATION/ RIGHT URETERAL BALLOON DILATION OF UPJ OBSTRUCTION/ BIOPSY OF RIGHT RENAL PELVIC MASS;  Surgeon: Ailene Rud, MD;  Location: [facility];  Service: Urology;  Laterality: Right;     Medications: Prior to Admission medications   Medication Sig Start Date End Date Taking? Authorizing Provider  acetaminophen (TYLENOL) 500 MG tablet Take 500 mg by mouth every 6 (six) hours as needed for mild pain.   Yes Historical Provider, MD  traMADol-acetaminophen (ULTRACET) 37.5-325 MG per tablet Take 1 tablet by mouth every 6 (six) hours as needed. Patient taking differently: Take 1 tablet by mouth every 6 (six) hours as needed for moderate pain.  05/05/14  Yes Ailene Rud, MD  ciprofloxacin (CIPRO) 500 MG tablet Take 1  tablet (500 mg total) by mouth 2 (two) times daily. Patient not taking: Reported on 05/06/2014 05/05/14   Ailene Rud, MD  phenazopyridine (PYRIDIUM) 200 MG tablet Take 1 tablet (200 mg total) by mouth 3 (three) times daily as needed for pain. Patient not taking: Reported on 05/06/2014 05/05/14   Ailene Rud, MD     Allergies:   Allergies  Allergen Reactions  . Valium Swelling    Face and hands    Social History:  Ambulatory   independently   Lives at home   With family    reports that he has been smoking Cigarettes.  He has a 30 pack-year smoking history. He has never used smokeless tobacco. He reports that he drinks alcohol. He reports that he does not use illicit drugs.    Family History: family history is not on file.    Physical Exam: Patient Vitals for the past 24 hrs:  BP Temp Temp src Pulse Resp SpO2  05/06/14 2053 (!) 214/114 mmHg - - 88 18 99 %  05/06/14 1847 190/94 mmHg 98.4 F (36.9 C) Oral 97 21 100 %    1. General:  in No Acute distress 2. Psychological: Alert and  Oriented 3. Head/ENT:     Dry Mucous Membranes                          Head Non traumatic, neck supple                          Normal   Dentition 4. SKIN: normal   Skin turgor,  Skin clean Dry and intact no rash 5. Heart: Regular rate and rhythm no Murmur, Rub or gallop 6. Lungs:   no wheezes coarse breath sounds,   7. Abdomen: Soft, non-tender, Non distended 8. Lower extremities: no clubbing, cyanosis, or edema 9. Neurologically Grossly intact, moving all 4 extremities equally 10. MSK: Normal range of motion costovertibral tenderness on the right  body mass index is unknown because there is no weight on file.   Labs on Admission:   Results for orders placed or performed during the hospital encounter of 05/06/14 (from the past 24 hour(s))  Urinalysis, Routine w reflex microscopic     Status: Abnormal   Collection Time: 05/06/14  7:07 PM  Result Value Ref Range   Color, Urine YELLOW YELLOW   APPearance CLOUDY (A) CLEAR   Specific Gravity, Urine 1.018 1.005 - 1.030   pH 6.5 5.0 - 8.0   Glucose, UA NEGATIVE NEGATIVE mg/dL   Hgb urine dipstick MODERATE (A) NEGATIVE   Bilirubin Urine NEGATIVE NEGATIVE   Ketones, ur NEGATIVE NEGATIVE mg/dL   Protein, ur NEGATIVE NEGATIVE mg/dL   Urobilinogen,  UA 0.2 0.0 - 1.0 mg/dL   Nitrite NEGATIVE NEGATIVE   Leukocytes, UA NEGATIVE NEGATIVE  Urine microscopic-add on     Status: None   Collection Time: 05/06/14  7:07 PM  Result Value Ref Range   WBC, UA 0-2 <3 WBC/hpf   RBC / HPF 21-50 <3 RBC/hpf  Basic metabolic panel     Status: Abnormal   Collection Time: 05/06/14  7:25 PM  Result Value Ref Range   Sodium 134 (L) 135 - 145 mmol/L   Potassium 3.7 3.5 - 5.1 mmol/L   Chloride 101 96 - 112 mmol/L   CO2 23 19 - 32 mmol/L   Glucose, Bld 107 (H) 70 - 99 mg/dL  BUN 8 6 - 23 mg/dL   Creatinine, Ser 0.99 0.50 - 1.35 mg/dL   Calcium 8.4 8.4 - 10.5 mg/dL   GFR calc non Af Amer >90 >90 mL/min   GFR calc Af Amer >90 >90 mL/min   Anion gap 10 5 - 15  CBC with Differential     Status: Abnormal   Collection Time: 05/06/14  7:25 PM  Result Value Ref Range   WBC 15.0 (H) 4.0 - 10.5 K/uL   RBC 4.65 4.22 - 5.81 MIL/uL   Hemoglobin 14.5 13.0 - 17.0 g/dL   HCT 42.9 39.0 - 52.0 %   MCV 92.3 78.0 - 100.0 fL   MCH 31.2 26.0 - 34.0 pg   MCHC 33.8 30.0 - 36.0 g/dL   RDW 13.5 11.5 - 15.5 %   Platelets 291 150 - 400 K/uL   Neutrophils Relative % 84 (H) 43 - 77 %   Neutro Abs 12.5 (H) 1.7 - 7.7 K/uL   Lymphocytes Relative 9 (L) 12 - 46 %   Lymphs Abs 1.4 0.7 - 4.0 K/uL   Monocytes Relative 6 3 - 12 %   Monocytes Absolute 0.9 0.1 - 1.0 K/uL   Eosinophils Relative 1 0 - 5 %   Eosinophils Absolute 0.2 0.0 - 0.7 K/uL   Basophils Relative 0 0 - 1 %   Basophils Absolute 0.0 0.0 - 0.1 K/uL    UA no UTI, large Blood  No results found for: HGBA1C  Estimated Creatinine Clearance: 104.9 mL/min (by C-G formula based on Cr of 0.99).  BNP (last 3 results) No results for input(s): PROBNP in the last 8760 hours.  Other results:  I have pearsonaly reviewed this: ECG not obtained   There were no vitals filed for this visit.   Cultures: No results found for: [place], [place], [place], REPTSTATUS   Radiological Exams on Admission: Ct Renal Stone  Study  05/06/2014   CLINICAL DATA:  Urinary retention. The patient had a renal biopsy done yesterday.  EXAM: CT ABDOMEN AND PELVIS WITHOUT CONTRAST  TECHNIQUE: Multidetector CT imaging of the abdomen and pelvis was performed following the standard protocol without IV contrast.  COMPARISON:  January 28, 2014  FINDINGS: There is right perinephric stranding presumably reason biopsy change. There is a 3.2 x 2.3 cm hematoma in the posterior midpole right kidney with blood extending to the right renal pelvis and proximal right ureter. There is small free air and fluid surrounding the proximal portion of the right ureter which could be post biopsy change. The previously noted stranding surrounding the remainder of There is a small amount of right ureter of persists and appears more prominent probably due to surrounding fluid from recent biopsy.  There is right retroperitoneal lymphadenopathy unchanged. The left kidney is normal. The liver, spleen, pancreas, gallbladder, adrenal glands are normal. There is atherosclerosis of the abdominal aorta without aneurysmal dilatation. There is no small bowel obstruction or diverticulitis. The appendix is normal.  The bladder is decompressed with Foley catheter in place. There is no pelvic lymphadenopathy. No acute abnormalities identified within the visualized bones. The lung bases are clear.  IMPRESSION: Post biopsy changes involving the right kidney and surrounding the proximal right ureter as described. There is a 3.2 x 2.3 cm hematoma in the posterior midpole right kidney with blood extending to the right renal pelvis and proximal right ureter. There is a small amount of free air and fluid surrounding the proximal portion of the right ureter which could be  postoperative change.   Electronically Signed   By: Abelardo Diesel M.D.   On: 05/06/2014 19:31    Chart has been reviewed  Assessment/Plan 43 yo M with history of right renal mass had recent biopsy presents with back pain  was found to have perinephric hematoma admitted for IV hydration and pain management of urology consult in the morning  Present on Admission:  . Perinephric hematoma - monitor CBC pain management, urology to see in the morning  . Leukocytosis - etiology unclear. Patient had recently had  . Acand URI type symptoms cells treated with penicillin he is a chronic smoker abnormal lung exam. Obtain chest x-ray  Accelerated hypertension - was in the setting of severe pain currently blood pressure improved will continue to monitor initiate blood pressure medications warmth once patient is stable  . Right flank pain -secondary to perinephric hematoma pain, and better control continue Dilaudid when necessary   Prophylaxis: SCD    CODE STATUS:  FULL CODE    Other plan as per orders.  I have spent a total of 55 min on this admission  Yuritzi Kamp 05/06/2014, 10:01 PM  [facility]  Pager [phone]   after 2 AM please page floor coverage PA If 7AM-7PM, please contact the day team taking care of the patient  [email or web address]  Password [email or web address]

## 2014-05-06 NOTE — ED Notes (Signed)
Patient had kidney (right) biopsy on yesterday for kidney cancer and has not been able to void since yesterday evening. Patient complains of extreme pain and pressure to abdomen and flank.

## 2014-05-06 NOTE — ED Provider Notes (Signed)
CSN: [id number]     Arrival date & time 05/06/14  1837 History   First MD Initiated Contact with Patient 05/06/14 1853     Chief Complaint  Patient presents with  . Urinary Retention     (Consider location/radiation/quality/duration/timing/severity/associated sxs/prior Treatment) HPI Comments: Patient is a 44 year old male with recently diagnosed renal mass. He apparently had a biopsy performed yesterday to further investigate this. Since yesterday evening, he has been unable to void. He presents today with complaints of severe pain in his right flank and urinary retention. He denies any fevers or chills.  Patient is a 44 y.o. male presenting with flank pain. The history is provided by the patient.  Flank Pain This is a new problem. The current episode started yesterday. The problem occurs constantly. The problem has been rapidly worsening. Pertinent negatives include no chest pain and no abdominal pain. Nothing aggravates the symptoms. Nothing relieves the symptoms. He has tried nothing for the symptoms. The treatment provided no relief.    Past Medical History  Diagnosis Date  . Right renal mass   . Borderline hypertension   . Bronchitis   . Hematuria    Past Surgical History  Procedure Laterality Date  . Cystoscopy with ureteroscopy Right 05/05/2014    Procedure: CYSTOSCOPY/ RIGHT URETEROSCOPY/ RIGHT RETROGRADE PYLEGRAM WITH INTERPRETATION/ RIGHT URETERAL BALLOON DILATION OF UPJ OBSTRUCTION/ BIOPSY OF RIGHT RENAL PELVIC MASS;  Surgeon: Ailene Rud, MD;  Location: [facility];  Service: Urology;  Laterality: Right;   History reviewed. No pertinent family history. History  Substance Use Topics  . Smoking status: Current Every Day Smoker -- 1.50 packs/day for 20 years    Types: Cigarettes  . Smokeless tobacco: Never Used  . Alcohol Use: 0.0 oz/week     Comment: rare    Review of Systems  Cardiovascular: Negative for chest pain.  Gastrointestinal:  Negative for abdominal pain.  Genitourinary: Positive for flank pain.  All other systems reviewed and are negative.     Allergies  Valium  Home Medications   Prior to Admission medications   Medication Sig Start Date End Date Taking? Authorizing Provider  ciprofloxacin (CIPRO) 500 MG tablet Take 1 tablet (500 mg total) by mouth 2 (two) times daily. 05/05/14   Ailene Rud, MD  phenazopyridine (PYRIDIUM) 200 MG tablet Take 1 tablet (200 mg total) by mouth 3 (three) times daily as needed for pain. 05/05/14   Ailene Rud, MD  traMADol-acetaminophen (ULTRACET) 37.5-325 MG per tablet Take 1 tablet by mouth every 6 (six) hours as needed. 05/05/14   Ailene Rud, MD   BP 190/94 mmHg  Pulse 97  Temp(Src) 98.4 F (36.9 C) (Oral)  Resp 21  SpO2 100% Physical Exam  Constitutional: He is oriented to person, place, and time. He appears well-developed and well-nourished. No distress.  HENT:  Head: Normocephalic and atraumatic.  Neck: Normal range of motion. Neck supple.  Cardiovascular: Normal rate, regular rhythm and normal heart sounds.   No murmur heard. Pulmonary/Chest: Effort normal and breath sounds normal. No respiratory distress. He has no wheezes.  Abdominal: Soft. Bowel sounds are normal. He exhibits no distension. There is no tenderness.  There is tenderness to palpation in the right flank.  Musculoskeletal: Normal range of motion. He exhibits no edema.  Neurological: He is alert and oriented to person, place, and time.  Skin: Skin is warm and dry. He is not diaphoretic.  Nursing note and vitals reviewed.   ED Course  Procedures (  including critical care time) Labs Review Labs Reviewed  BASIC METABOLIC PANEL  CBC WITH DIFFERENTIAL/PLATELET  URINALYSIS, ROUTINE W REFLEX MICROSCOPIC    Imaging Review No results found.   EKG Interpretation None      MDM   Final diagnoses:  None    Patient presents with complaints of severe right flank pain  24 hours after a biopsy of a right-sided renal mass. Workup reveals no evidence for infection, however CT scan reveals a hemorrhage within the renal parenchyma with extension into the renal pelvis and ureter. I've discussed these findings with Dr. Glo Herring from urology. As the patient continues with pain that I'm having difficulty controlling, he will be admitted for pain control and further urologic evaluation. Dr. Roel Cluck to admit.    Veryl Speak, MD 05/06/14 2158

## 2014-05-07 ENCOUNTER — Inpatient Hospital Stay (HOSPITAL_COMMUNITY): Payer: No Typology Code available for payment source | Admitting: Anesthesiology

## 2014-05-07 ENCOUNTER — Encounter (HOSPITAL_COMMUNITY): Payer: Self-pay | Admitting: Certified Registered Nurse Anesthetist

## 2014-05-07 ENCOUNTER — Encounter (HOSPITAL_COMMUNITY)
Admission: EM | Disposition: A | Discharge status: Home / Self Care | Payer: Self-pay | Source: Home / Self Care | Attending: Urology

## 2014-05-07 HISTORY — PX: CYSTOSCOPY W/ URETERAL STENT PLACEMENT: SHX1429

## 2014-05-07 LAB — CBC
HCT: 38.5 % — ABNORMAL LOW (ref 39.0–52.0)
HCT: 39.9 % (ref 39.0–52.0)
HEMATOCRIT: 41 % (ref 39.0–52.0)
HEMOGLOBIN: 13.7 g/dL (ref 13.0–17.0)
Hemoglobin: 13.1 g/dL (ref 13.0–17.0)
Hemoglobin: 13.1 g/dL (ref 13.0–17.0)
MCH: 30.9 pg (ref 26.0–34.0)
MCH: 30.4 pg (ref 26.0–34.0)
MCH: 31.4 pg (ref 26.0–34.0)
MCHC: 32.8 g/dL (ref 30.0–36.0)
MCHC: 33.4 g/dL (ref 30.0–36.0)
MCHC: 34 g/dL (ref 30.0–36.0)
MCV: 92.3 fL (ref 78.0–100.0)
MCV: 92.6 fL (ref 78.0–100.0)
MCV: 92.6 fL (ref 78.0–100.0)
PLATELETS: 279 10*3/uL (ref 150–400)
PLATELETS: 272 10*3/uL (ref 150–400)
Platelets: 268 10*3/uL (ref 150–400)
RBC: 4.17 MIL/uL — ABNORMAL LOW (ref 4.22–5.81)
RBC: 4.31 MIL/uL (ref 4.22–5.81)
RBC: 4.43 MIL/uL (ref 4.22–5.81)
RDW: 13.4 % (ref 11.5–15.5)
RDW: 13.4 % (ref 11.5–15.5)
RDW: 13.5 % (ref 11.5–15.5)
WBC: 12.2 10*3/uL — AB (ref 4.0–10.5)
WBC: 13.4 10*3/uL — ABNORMAL HIGH (ref 4.0–10.5)
WBC: 14.4 10*3/uL — ABNORMAL HIGH (ref 4.0–10.5)

## 2014-05-07 LAB — COMPREHENSIVE METABOLIC PANEL
ALT: 14 U/L (ref 0–53)
AST: 14 U/L (ref 0–37)
Albumin: 3.6 g/dL (ref 3.5–5.2)
Alkaline Phosphatase: 58 U/L (ref 39–117)
Anion gap: 6 (ref 5–15)
BILIRUBIN TOTAL: 0.7 mg/dL (ref 0.3–1.2)
BUN: 8 mg/dL (ref 6–23)
CALCIUM: 8.1 mg/dL — AB (ref 8.4–10.5)
CO2: 26 mmol/L (ref 19–32)
Chloride: 102 mmol/L (ref 96–112)
Creatinine, Ser: 0.85 mg/dL (ref 0.50–1.35)
GFR calc non Af Amer: >90 mL/min (ref >90)
Glucose, Bld: 100 mg/dL — ABNORMAL HIGH (ref 70–99)
Potassium: 3.9 mmol/L (ref 3.5–5.1)
Sodium: 134 mmol/L — ABNORMAL LOW (ref 135–145)
TOTAL PROTEIN: 6.7 g/dL (ref 6.0–8.3)

## 2014-05-07 LAB — TSH: TSH: 2.425 u[IU]/mL (ref 0.350–4.500)

## 2014-05-07 LAB — MAGNESIUM: Magnesium: 1.8 mg/dL (ref 1.5–2.5)

## 2014-05-07 LAB — PROTIME-INR
INR: 1.05 (ref 0.00–1.49)
Prothrombin Time: 13.9 seconds (ref 11.6–15.2)

## 2014-05-07 LAB — GLUCOSE, CAPILLARY: Glucose-Capillary: 97 mg/dL (ref 70–99)

## 2014-05-07 LAB — PHOSPHORUS: Phosphorus: 3.5 mg/dL (ref 2.3–4.6)

## 2014-05-07 SURGERY — CYSTOSCOPY, WITH RETROGRADE PYELOGRAM AND URETERAL STENT INSERTION
Anesthesia: General | Site: Ureter | Laterality: Right

## 2014-05-07 MED ORDER — FENTANYL CITRATE 0.05 MG/ML IJ SOLN
INTRAMUSCULAR | Status: DC | PRN
  Administered 2014-05-07 (×2): 25 ug via INTRAVENOUS
  Administered 2014-05-07 (×3): 50 ug via INTRAVENOUS

## 2014-05-07 MED ORDER — LIDOCAINE HCL (CARDIAC) 20 MG/ML IV SOLN
INTRAVENOUS | Status: DC | PRN
  Administered 2014-05-07: 100 mg via INTRAVENOUS

## 2014-05-07 MED ORDER — FENTANYL CITRATE 0.05 MG/ML IJ SOLN: 25.0000 ug | INTRAMUSCULAR | Status: DC | PRN

## 2014-05-07 MED ORDER — ONDANSETRON HCL 4 MG/2ML IJ SOLN
INTRAMUSCULAR | Status: AC
  Filled 2014-05-07: qty 2

## 2014-05-07 MED ORDER — LIDOCAINE HCL (CARDIAC) 20 MG/ML IV SOLN
INTRAVENOUS | Status: AC
  Filled 2014-05-07: qty 5

## 2014-05-07 MED ORDER — LACTATED RINGERS IV SOLN
INTRAVENOUS | Status: DC | PRN
  Administered 2014-05-07 (×2): via INTRAVENOUS

## 2014-05-07 MED ORDER — 0.9 % SODIUM CHLORIDE (POUR BTL) OPTIME
TOPICAL | Status: DC | PRN
  Administered 2014-05-07: 1000 mL

## 2014-05-07 MED ORDER — LACTATED RINGERS IV SOLN: INTRAVENOUS | Status: DC

## 2014-05-07 MED ORDER — FENTANYL CITRATE 0.05 MG/ML IJ SOLN: 50.0000 ug | INTRAMUSCULAR | Status: DC | PRN

## 2014-05-07 MED ORDER — PROMETHAZINE HCL 25 MG/ML IJ SOLN: 12.5000 mg | INTRAMUSCULAR | Status: DC | PRN

## 2014-05-07 MED ORDER — MIDAZOLAM HCL 2 MG/2ML IJ SOLN
INTRAMUSCULAR | Status: AC
  Filled 2014-05-07: qty 2

## 2014-05-07 MED ORDER — SODIUM CHLORIDE 0.9 % IR SOLN
Status: DC | PRN
  Administered 2014-05-07: 1000 mL
  Administered 2014-05-07: 3000 mL
  Administered 2014-05-07 (×2): 1000 mL

## 2014-05-07 MED ORDER — PROPOFOL 10 MG/ML IV BOLUS
INTRAVENOUS | Status: AC
  Filled 2014-05-07: qty 20

## 2014-05-07 MED ORDER — IOHEXOL 300 MG/ML  SOLN
INTRAMUSCULAR | Status: DC | PRN
  Administered 2014-05-07: 10 mL

## 2014-05-07 MED ORDER — FENTANYL CITRATE 0.05 MG/ML IJ SOLN
INTRAMUSCULAR | Status: AC
  Filled 2014-05-07: qty 2

## 2014-05-07 MED ORDER — PROPOFOL 10 MG/ML IV BOLUS
INTRAVENOUS | Status: DC | PRN
  Administered 2014-05-07: 200 mg via INTRAVENOUS

## 2014-05-07 MED ORDER — TAMSULOSIN HCL 0.4 MG PO CAPS
0.4000 mg | ORAL_CAPSULE | Freq: Every day | ORAL | Status: DC
  Administered 2014-05-07 – 2014-05-08 (×2): 0.4 mg via ORAL
  Filled 2014-05-07 (×2): qty 1

## 2014-05-07 MED ORDER — MIDAZOLAM HCL 5 MG/5ML IJ SOLN: INTRAMUSCULAR | Status: DC | PRN

## 2014-05-07 SURGICAL SUPPLY — 18 items
BAG URINE DRAINAGE (UROLOGICAL SUPPLIES) ×3 IMPLANT
BAG URO CATCHER STRL LF (DRAPE) ×3 IMPLANT
CATH FOLEY 2WAY SLVR  5CC 16FR (CATHETERS) ×2
CATH FOLEY 2WAY SLVR 5CC 16FR (CATHETERS) ×1 IMPLANT
CATH INTERMIT  6FR 70CM (CATHETERS) ×3 IMPLANT
GOWN STRL REUS W/ TWL LRG LVL3 (GOWN DISPOSABLE) ×1 IMPLANT
GOWN STRL REUS W/ TWL XL LVL3 (GOWN DISPOSABLE) ×2 IMPLANT
GOWN STRL REUS W/TWL LRG LVL3 (GOWN DISPOSABLE) ×2
GOWN STRL REUS W/TWL XL LVL3 (GOWN DISPOSABLE) ×4
GUIDEWIRE ANG ZIPWIRE 038X150 (WIRE) ×3 IMPLANT
GUIDEWIRE STR DUAL SENSOR (WIRE) ×6 IMPLANT
IV NS IRRIG 3000ML ARTHROMATIC (IV SOLUTION) ×3 IMPLANT
IV SOD CHL 0.9% 1000ML (IV SOLUTION) ×3 IMPLANT
PACK CYSTO (CUSTOM PROCEDURE TRAY) ×3 IMPLANT
STENT CONTOUR 6FRX24X.038 (STENTS) ×3 IMPLANT
TUBING CONNECTING 10 (TUBING) ×2 IMPLANT
TUBING CONNECTING 10' (TUBING) ×1
WATER STERILE IRR 500ML POUR (IV SOLUTION) ×3 IMPLANT

## 2014-05-07 NOTE — Progress Notes (Signed)
pacu notes:  pts cell phone brought to pacu from operating room staff.  Returned to patient prior to discharge from pacu.  Cell phone with patient when transported and in his hospital room

## 2014-05-07 NOTE — Transfer of Care (Signed)
Immediate Anesthesia Transfer of Care Note  Patient: Timothy Hickman  Procedure(s) Performed: Procedure(s): CYSTOSCOPY WITH RETROGRADE PYELOGRAM/RIGHT DIAGNOSTIC URETEROSCOPY, RIGHT URETERAL STENT PLACEMENT (Right)  Patient Location: PACU  Anesthesia Type:General  Level of Consciousness: awake, alert  and oriented  Airway & Oxygen Therapy: Patient Spontanous Breathing and Patient connected to face mask oxygen  Post-op Assessment: Report given to PACU RN and Post -op Vital signs reviewed and stable  Post vital signs: Reviewed and stable  Complications: No apparent anesthesia complications

## 2014-05-07 NOTE — Anesthesia Postprocedure Evaluation (Signed)
  Anesthesia Post-op Note  Patient: Timothy Hickman  Procedure(s) Performed: Procedure(s) (LRB): CYSTOSCOPY WITH RETROGRADE PYELOGRAM/RIGHT DIAGNOSTIC URETEROSCOPY, RIGHT URETERAL STENT PLACEMENT (Right)  Patient Location: PACU  Anesthesia Type: General  Level of Consciousness: awake and alert   Airway and Oxygen Therapy: Patient Spontanous Breathing  Post-op Pain: mild  Post-op Assessment: Post-op Vital signs reviewed, Patient's Cardiovascular Status Stable, Respiratory Function Stable, Patent Airway and No signs of Nausea or vomiting  Last Vitals:  Filed Vitals:   05/07/14 1530  BP: 157/90  Pulse: 88  Temp: 37.2 C  Resp: 20    Post-op Vital Signs: stable   Complications: No apparent anesthesia complications

## 2014-05-07 NOTE — Op Note (Signed)
Preoperative diagnosis: Right ureteral obstruction, right renal pelvic tumor  Postoperative diagnosis: Right ureteral obstruction, right renal pelvic tumor  Procedure: 1.  Cystoscopy 2.  Right retrograde pyelography 3.  Right ureteroscopy 4.  Right ureteral stent placement ( 6 x 24 - no string)  Surgeon: Dr. Roxy Horseman, Brooke Bonito.  Resident: Dr. Park Liter  Anesthesia: General  Complications: None  Estimated blood loss: Minimal  Intraoperative findings: Right retrograde pyelography was utilized using approximately 15 cc of Omnipaque contrast.  Initial images demonstrated evidence of extravasation in the vicinity of the proximal/mid ureter and medial to the renal pelvis. The ureter was extremely edematous and abnormal appearing throughout consistent with edema.  Eventually, once access was obtained into the renal pelvis, Omnipaque contrast was injected which revealed a hydronephrotic upper pole of the kidney with filling defects of the lower pole consistent with the patient's known tumor.  Indication:  Mr. Deziel is a 44 year old gentleman with a right renal pelvic tumor.  2 days ago, he underwent a procedure by Dr. Era Bumpers which included flexible right ureteroscopy with a ureteral access sheath and biopsy of a right renal pelvic tumor.  A postoperative ureteral stent was not left in place.  The patient returned to the emergency room last night with severe right-sided flank pain that was not able to be well controlled with oral pain medication.  He has been requiring IV pain medication every 3-4 hours to control his pain.  CT imaging revealed clot within the right renal pelvis along with right hydronephrosis consistent with right ureteral obstruction related to clot/ureteral edema.  After discussing these findings with the patient, I did recommend that he proceed with cystoscopy and right ureteral stent placement in order to improve his pain control.  We reviewed the potential risks,  complications, and alternative options associated with this procedure.  He gave his informed consent.  Description of procedure:  The patient was taken to the operating room and a general anesthetic was administered.  He had been on ciprofloxacin during his hospitalization and it was ensured that he had received this and was not due for an additional dose at this time.  This was confirmed and a preoperative timeout was performed.  He was placed in dorsal lithotomy position and prepped and draped in the usual sterile fashion.  Cystourethroscopy was then performed with a 21 French cystoscope.  This demonstrated a normal anterior urethra.  The prostatic urethra demonstrated mild bilateral hypertrophy of the lateral lobes.  Inspection of the bladder revealed no evidence of any bladder tumors and a normal-appearing left ureteral orifice in the expected anatomic location.  The right ureteral orifice was extremely edematous with no reflux of urine.  A 0.38 sensor guidewire was then advanced through a 6 [place] ureteral catheter and an attempt was made to intubate the right ureter.  This was not initially successful.  A 0.38 glide wire was then utilized to cannulate the edematous ureteral orifice.  This wire was eventually able to be passed up into the proximal ureter with relative ease.  A 6 French ureteral catheter was then passed over this wire into the mid/distal ureter and Omnipaque contrast was injected.  There was noted to be evidence of extravasation in the vicinity of the mid/proximal ureter.  The wire was then exchanged for the ureteral catheter.  The 4.5 French semirigid ureteroscope was then advanced into the distal ureter.  The ureter appeared extremely edematous and there was a question whether the wire was intraluminal or submucosal.  The appearance of the ureter was potentially consistent with severe edema related to his prior procedure and ureteral access sheath. After an exhaustive search to make sure  there was not a separate native ureteral lumen, the ureteroscope was gently advanced along the wire.  Although there were some areas that suggested false passages and possibly the cause for the extravasation seen on the retrograde pyelogram, this did appear to be the true lumen of the ureter.  This ureteroscope was able to be advanced above the iliac vessels and up into the proximal ureter.  In the proximal ureter, there was noted to be severe edema.  There was one areas that appear to be consistent with a false passage and a secondary that appeared to be consistent with the native UPJ lumen.  A wire was able to be inserted through this true lumen and this appeared to curl within the upper pole of the kidney.  The 6 French ureteral catheter was advanced over the wire and Omnipaque contrast confirmed the location within the upper pole of the right kidney.  The calyces appeared to be dilated consistent with obstruction.  The wire was then replaced and back loaded onto the cystoscope.  A 6 x 24 double-J ureteral stent was then advanced over the wire using Seldinger technique and positioned appropriately under fluoroscopic and cystoscopic guidance.  The wire was then removed with an adequate curl noted within the renal pelvis as well as within the bladder.  There was noted to be blood from the holes in the ureteral stent consistent with what products in the collecting system of the kidney seen on his preoperative imaging.  This did appear to be dark blood consistent with old blood.  For this reason, a 65 French Foley catheter was inserted and left in place to drain.  The patient tolerated the procedure well without complications.  He was able to be extubated and transferred to the recovery unit in satisfactory condition.

## 2014-05-07 NOTE — Consult Note (Signed)
[facility] CONSULT FOLLOW-UP NOTE  Timothy Hickman EUM:[id number] DOB: Mar 04, 1971 DOA: 05/06/2014 PCP: No PCP Per Patient Requesting physician: Urology-Dr. Seward Carol Attending service: Urology  Impression/Recommendations: 1. Obstructive uropathy-per Dr. Lynne Logan OP note, Patient has undergone today cystoscopy, right retrograde pyelogram right ureteroscopy and right urethral stent basement 6 X 24 which revealed a hydronephrosis to the upper pole of kidney-there was a noted clot in the right renal pelvis and a double-J ureteral stent was placed under fluoroscopic and cystoscopic evidence.  His issues currently are primarily urological in nature.  Dr. Alinda Money agrees to maintain Timothy Hickman on his service.I'm happy to assist medically if you need me 2. Probable Urothelial CA per Pathology 05/05/14-Per Urology.  3. Accelerated hypertension-resolved and probably secondary to the obstructive uropathy/pain. He has had borderline hypertension the past. If his blood pressure remains above 140/90 I would recommend low-dose HCTZ 12.5 or calcium channel blocker Norvasc 5 mg--he does not seem inclined to think that he needs any medicine 4. Impaired glucose tolerance-CBG ~ 100 corresponds to A1c ranging between 5=5 and 6.  Suggest OP PCP to follow up 5. Prior tobacco abuse-counseled to quit smoking-doesn't wish a nicoderm patch.  I have mentioned healing may be delayed if he continues to smoke   Family Communication: Spoke with Wife.  Explained if i am needed, Urology knows they can call for assitacne Disposition Plan: inpatient pending urology input re: dispo  Verneita Griffes, MD [facility] [phone]  05/07/2014, 3:14 PM  LOS: 1 day   Brief narrative: 44 y/o ? former smoker 1.5 ppd, chron ETOH [beer], OSA not on cpap, prior L rib #'s 06/2012,Htn  Urinary retention form possible right 4.1cm Renal mass in ED 01/27/14.  Underwent by Dr. Gaynelle Arabian on 1/21elective Cystourethroscopy + R retro  pyelogram c R flexible ureteroscopy and ballon dilatation R UPJ Unfortunately after being discharged, on 1/22 he had inability to void with extreme pain and pressure to the abdomen and flank and was admitted by the hospitalist service via emergency room for WBC 15, hypertensive urgency, uncontrolled back pain secondary to obstructive uropathy from inital procedure   Other consultants:  none  Procedures:  Cysto as above 1/23  Antibiotics:  Cipro ?  HPI/Subjective: Unhappy Doesn't like foley Just back from PACU   Objective: Filed Vitals:   05/07/14 0532 05/07/14 1450 05/07/14 1500 05/07/14 1510  BP: 141/73 156/85 143/81   Pulse: 88  95 87  Temp: 98.9 F (37.2 C) 97.9 F (36.6 C)    TempSrc: Oral     Resp: 18 22 22 19   SpO2: 95%  100% 100%    Intake/Output Summary (Last 24 hours) at 05/07/14 1514 Last data filed at 05/07/14 1453  Gross per 24 hour  Intake   2300 ml  Output   2550 ml  Net   -250 ml    Exam:   General:  Flat affect.    Cardiovascular:  s1 s 2no m/r/g  Respiratory: clear  Abdomen: suboptimal exam-patient standing.   NO LE edema  Data Reviewed: Basic Metabolic Panel:  Recent Labs Lab 05/06/14 1925 05/07/14 0525  NA 134* 134*  K 3.7 3.9  CL 101 102  CO2 23 26  GLUCOSE 107* 100*  BUN 8 8  CREATININE 0.99 0.85  CALCIUM 8.4 8.1*  MG  --  1.8  PHOS  --  3.5   Liver Function Tests:  Recent Labs Lab 05/07/14 0525  AST 14  ALT 14  ALKPHOS 58  BILITOT 0.7  PROT  6.7  ALBUMIN 3.6   No results for input(s): LIPASE, AMYLASE in the last 168 hours. No results for input(s): AMMONIA in the last 168 hours. CBC:  Recent Labs Lab 05/05/14 0823 05/06/14 1925 05/07/14 0045 05/07/14 0525 05/07/14 1158  WBC  --  15.0* 14.4* 12.2* 13.4*  NEUTROABS  --  12.5*  --   --   --   HGB 15.0 14.5 13.1 13.1 13.7  HCT  --  42.9 38.5* 39.9 41.0  MCV  --  92.3 92.3 92.6 92.6  PLT  --  291 268 279 272   Cardiac Enzymes: No results for  input(s): CKTOTAL, CKMB, CKMBINDEX, TROPONINI in the last 168 hours. BNP (last 3 results) No results for input(s): PROBNP in the last 8760 hours. CBG: No results for input(s): GLUCAP in the last 168 hours.  No results found for this or any previous visit (from the past 240 hour(s)).   Studies: X-ray Chest Pa And Lateral  05/06/2014   CLINICAL DATA:  Cough.  Initial encounter.  EXAM: CHEST  2 VIEW  COMPARISON:  Chest radiograph from 05/17/2013  FINDINGS: The lungs are well-aerated. An apparent 1.0 cm nodule at the right lung apex is stable from the prior study and likely benign. There is no evidence of focal opacification, pleural effusion or pneumothorax.  The heart is normal in size; the mediastinal contour is within normal limits. Mild vascular congestion is noted. No acute osseous abnormalities are seen.  IMPRESSION: Mild vascular congestion noted; lungs remain grossly clear. Stable 1.0 cm nodule at the right lung apex, likely benign.   Electronically Signed   By: Garald Balding M.D.   On: 05/06/2014 23:49   Ct Renal Stone Study  05/06/2014   CLINICAL DATA:  Urinary retention. The patient had a renal biopsy done yesterday.  EXAM: CT ABDOMEN AND PELVIS WITHOUT CONTRAST  TECHNIQUE: Multidetector CT imaging of the abdomen and pelvis was performed following the standard protocol without IV contrast.  COMPARISON:  January 28, 2014  FINDINGS: There is right perinephric stranding presumably reason biopsy change. There is a 3.2 x 2.3 cm hematoma in the posterior midpole right kidney with blood extending to the right renal pelvis and proximal right ureter. There is small free air and fluid surrounding the proximal portion of the right ureter which could be post biopsy change. The previously noted stranding surrounding the remainder of There is a small amount of right ureter of persists and appears more prominent probably due to surrounding fluid from recent biopsy.  There is right retroperitoneal  lymphadenopathy unchanged. The left kidney is normal. The liver, spleen, pancreas, gallbladder, adrenal glands are normal. There is atherosclerosis of the abdominal aorta without aneurysmal dilatation. There is no small bowel obstruction or diverticulitis. The appendix is normal.  The bladder is decompressed with Foley catheter in place. There is no pelvic lymphadenopathy. No acute abnormalities identified within the visualized bones. The lung bases are clear.  IMPRESSION: Post biopsy changes involving the right kidney and surrounding the proximal right ureter as described. There is a 3.2 x 2.3 cm hematoma in the posterior midpole right kidney with blood extending to the right renal pelvis and proximal right ureter. There is a small amount of free air and fluid surrounding the proximal portion of the right ureter which could be postoperative change.   Electronically Signed   By: Abelardo Diesel M.D.   On: 05/06/2014 19:31    Scheduled Meds: . ciprofloxacin  500 mg Oral BID  .  docusate sodium  100 mg Oral BID  . tamsulosin  0.4 mg Oral Daily   Continuous Infusions:    Active Problems:   Perinephric hematoma   Leukocytosis   Accelerated hypertension   Right flank pain   Renal mass    Time Spent: 20  Verneita Griffes, MD [facility]   If 7PM-7AM, please contact night-coverage at [email or web address], password [facility] 05/07/2014, 3:14 PM  LOS: 1 day

## 2014-05-07 NOTE — Anesthesia Procedure Notes (Signed)
Procedure Name: LMA Insertion Date/Time: 05/07/2014 1:12 PM Performed by: Dimas Millin, Celestine Prim F Pre-anesthesia Checklist: Patient identified, Emergency Drugs available, Suction available, Patient being monitored and Timeout performed Patient Re-evaluated:Patient Re-evaluated prior to inductionOxygen Delivery Method: Circle system utilized Preoxygenation: Pre-oxygenation with 100% oxygen Intubation Type: IV induction LMA: LMA inserted LMA Size: 4.0 Number of attempts: 1 Tube secured with: Tape

## 2014-05-07 NOTE — Anesthesia Preprocedure Evaluation (Addendum)
Anesthesia Evaluation  Patient identified by MRN, date of birth, ID band Patient awake    Reviewed: Allergy & Precautions, NPO status , Patient's Chart, lab work & pertinent test results  Airway Mallampati: II  TM Distance: >3 FB Neck ROM: Full    Dental no notable dental hx. (+) Teeth Intact, Dental Advisory Given Currently on penicillin for left upper rear dental infection. He may have lost a filling he thinks.:   Pulmonary Current Smoker,  bronchitis breath sounds clear to auscultation  Pulmonary exam normal       Cardiovascular negative cardio ROS  Rhythm:Regular Rate:Normal  Borderline htn   Neuro/Psych negative neurological ROS  negative psych ROS   GI/Hepatic negative GI ROS, Neg liver ROS,   Endo/Other  negative endocrine ROS  Renal/GU negative Renal ROS  negative genitourinary   Musculoskeletal negative musculoskeletal ROS (+)   Abdominal (+) + obese,   Peds negative pediatric ROS (+)  Hematology negative hematology ROS (+)   Anesthesia Other Findings   Reproductive/Obstetrics negative OB ROS                            Anesthesia Physical Anesthesia Plan  ASA: II  Anesthesia Plan: General   Post-op Pain Management:    Induction:   Airway Management Planned: LMA  Additional Equipment:   Intra-op Plan:   Post-operative Plan:   Informed Consent:   Plan Discussed with: Surgeon  Anesthesia Plan Comments:         Anesthesia Quick Evaluation

## 2014-05-07 NOTE — Consult Note (Signed)
Urology Consult   Physician requesting consult: Dr. Roel Cluck  Reason for consult: Right flank pain and right ureteral obstruction  History of Present Illness: Timothy Hickman is a 44 y.o. followed by Dr. Gaynelle Arabian and s/p right ureteroscopy with biopsy of a right renal pelvic mass.  He also apparently had evidence of right UPJ obstruction and underwent dilation of the narrowed segment at the UPJ for the procedure.  A postoperative ureteral stent was not left in place.  He developed severe uncontrolled right flank pain with radiation to right lower quadrant of the abdomen. He is no longer having hematuria.  He was admitted through the ED last night and has continued to require IV pain medication to control his pain. No fever.    Past Medical History  Diagnosis Date  . Right renal mass   . Borderline hypertension   . Bronchitis   . Hematuria     Past Surgical History  Procedure Laterality Date  . Cystoscopy with ureteroscopy Right 05/05/2014    Procedure: CYSTOSCOPY/ RIGHT URETEROSCOPY/ RIGHT RETROGRADE PYLEGRAM WITH INTERPRETATION/ RIGHT URETERAL BALLOON DILATION OF UPJ OBSTRUCTION/ BIOPSY OF RIGHT RENAL PELVIC MASS;  Surgeon: Ailene Rud, MD;  Location: [facility];  Service: Urology;  Laterality: Right;    Current Hospital Medications:  Home Meds:    Medication List    ASK your doctor about these medications        acetaminophen 500 MG tablet  Commonly known as:  TYLENOL  Take 500 mg by mouth every 6 (six) hours as needed for mild pain.     ciprofloxacin 500 MG tablet  Commonly known as:  CIPRO  Take 1 tablet (500 mg total) by mouth 2 (two) times daily.     phenazopyridine 200 MG tablet  Commonly known as:  PYRIDIUM  Take 1 tablet (200 mg total) by mouth 3 (three) times daily as needed for pain.     traMADol-acetaminophen 37.5-325 MG per tablet  Commonly known as:  ULTRACET  Take 1 tablet by mouth every 6 (six) hours as needed.         Scheduled Meds: . ciprofloxacin  500 mg Oral BID  . docusate sodium  100 mg Oral BID   Continuous Infusions: . sodium chloride 100 mL/hr at 05/06/14 2355   PRN Meds:.acetaminophen **OR** acetaminophen, albuterol, HYDROmorphone (DILAUDID) injection, ondansetron **OR** ondansetron (ZOFRAN) IV, oxyCODONE  Allergies:  Allergies  Allergen Reactions  . Valium Swelling    Face and hands    Family History  Problem Relation Age of Onset  . Breast cancer Mother   . HIV/AIDS Father     Social History:  reports that he has been smoking Cigarettes.  He has a 30 pack-year smoking history. He has never used smokeless tobacco. He reports that he drinks alcohol. He reports that he does not use illicit drugs.  ROS: A complete review of systems was performed.  All systems are negative except for pertinent findings as noted.  Physical Exam:  Vital signs in last 24 hours: Temp:  [98.2 F (36.8 C)-98.9 F (37.2 C)] 98.9 F (37.2 C) (01/23 0532) Pulse Rate:  [81-97] 88 (01/23 0532) Resp:  [16-21] 18 (01/23 0532) BP: (141-214)/(73-114) 141/73 mmHg (01/23 0532) SpO2:  [95 %-100 %] 95 % (01/23 0532) Constitutional:  Alert and oriented, No acute distress Cardiovascular: No JVD Respiratory: Normal respiratory effort GI: Abdomen is soft, nontender, nondistended, no abdominal masses GU: No CVA tenderness currently Neurologic: Grossly intact, no focal deficits Psychiatric: Normal mood  and affect  Laboratory Data:   Recent Labs  05/05/14 0823 05/06/14 1925 05/07/14 0045 05/07/14 0525  WBC  --  15.0* 14.4* 12.2*  HGB 15.0 14.5 13.1 13.1  HCT  --  42.9 38.5* 39.9  PLT  --  291 268 279     Recent Labs  05/06/14 1925 05/07/14 0525  NA 134* 134*  K 3.7 3.9  CL 101 102  GLUCOSE 107* 100*  BUN 8 8  CALCIUM 8.4 8.1*  CREATININE 0.99 0.85     Results for orders placed or performed during the hospital encounter of 05/06/14 (from the past 24 hour(s))  Urinalysis, Routine w  reflex microscopic     Status: Abnormal   Collection Time: 05/06/14  7:07 PM  Result Value Ref Range   Color, Urine YELLOW YELLOW   APPearance CLOUDY (A) CLEAR   Specific Gravity, Urine 1.018 1.005 - 1.030   pH 6.5 5.0 - 8.0   Glucose, UA NEGATIVE NEGATIVE mg/dL   Hgb urine dipstick MODERATE (A) NEGATIVE   Bilirubin Urine NEGATIVE NEGATIVE   Ketones, ur NEGATIVE NEGATIVE mg/dL   Protein, ur NEGATIVE NEGATIVE mg/dL   Urobilinogen, UA 0.2 0.0 - 1.0 mg/dL   Nitrite NEGATIVE NEGATIVE   Leukocytes, UA NEGATIVE NEGATIVE  Urine microscopic-add on     Status: None   Collection Time: 05/06/14  7:07 PM  Result Value Ref Range   WBC, UA 0-2 <3 WBC/hpf   RBC / HPF 21-50 <3 RBC/hpf  Basic metabolic panel     Status: Abnormal   Collection Time: 05/06/14  7:25 PM  Result Value Ref Range   Sodium 134 (L) 135 - 145 mmol/L   Potassium 3.7 3.5 - 5.1 mmol/L   Chloride 101 96 - 112 mmol/L   CO2 23 19 - 32 mmol/L   Glucose, Bld 107 (H) 70 - 99 mg/dL   BUN 8 6 - 23 mg/dL   Creatinine, Ser 0.99 0.50 - 1.35 mg/dL   Calcium 8.4 8.4 - 10.5 mg/dL   GFR calc non Af Amer >90 >90 mL/min   GFR calc Af Amer >90 >90 mL/min   Anion gap 10 5 - 15  CBC with Differential     Status: Abnormal   Collection Time: 05/06/14  7:25 PM  Result Value Ref Range   WBC 15.0 (H) 4.0 - 10.5 K/uL   RBC 4.65 4.22 - 5.81 MIL/uL   Hemoglobin 14.5 13.0 - 17.0 g/dL   HCT 42.9 39.0 - 52.0 %   MCV 92.3 78.0 - 100.0 fL   MCH 31.2 26.0 - 34.0 pg   MCHC 33.8 30.0 - 36.0 g/dL   RDW 13.5 11.5 - 15.5 %   Platelets 291 150 - 400 K/uL   Neutrophils Relative % 84 (H) 43 - 77 %   Neutro Abs 12.5 (H) 1.7 - 7.7 K/uL   Lymphocytes Relative 9 (L) 12 - 46 %   Lymphs Abs 1.4 0.7 - 4.0 K/uL   Monocytes Relative 6 3 - 12 %   Monocytes Absolute 0.9 0.1 - 1.0 K/uL   Eosinophils Relative 1 0 - 5 %   Eosinophils Absolute 0.2 0.0 - 0.7 K/uL   Basophils Relative 0 0 - 1 %   Basophils Absolute 0.0 0.0 - 0.1 K/uL  CBC     Status: Abnormal    Collection Time: 05/07/14 12:45 AM  Result Value Ref Range   WBC 14.4 (H) 4.0 - 10.5 K/uL   RBC 4.17 (L) 4.22 -  5.81 MIL/uL   Hemoglobin 13.1 13.0 - 17.0 g/dL   HCT 38.5 (L) 39.0 - 52.0 %   MCV 92.3 78.0 - 100.0 fL   MCH 31.4 26.0 - 34.0 pg   MCHC 34.0 30.0 - 36.0 g/dL   RDW 13.4 11.5 - 15.5 %   Platelets 268 150 - 400 K/uL  Magnesium     Status: None   Collection Time: 05/07/14  5:25 AM  Result Value Ref Range   Magnesium 1.8 1.5 - 2.5 mg/dL  Phosphorus     Status: None   Collection Time: 05/07/14  5:25 AM  Result Value Ref Range   Phosphorus 3.5 2.3 - 4.6 mg/dL  Comprehensive metabolic panel     Status: Abnormal   Collection Time: 05/07/14  5:25 AM  Result Value Ref Range   Sodium 134 (L) 135 - 145 mmol/L   Potassium 3.9 3.5 - 5.1 mmol/L   Chloride 102 96 - 112 mmol/L   CO2 26 19 - 32 mmol/L   Glucose, Bld 100 (H) 70 - 99 mg/dL   BUN 8 6 - 23 mg/dL   Creatinine, Ser 0.85 0.50 - 1.35 mg/dL   Calcium 8.1 (L) 8.4 - 10.5 mg/dL   Total Protein 6.7 6.0 - 8.3 g/dL   Albumin 3.6 3.5 - 5.2 g/dL   AST 14 0 - 37 U/L   ALT 14 0 - 53 U/L   Alkaline Phosphatase 58 39 - 117 U/L   Total Bilirubin 0.7 0.3 - 1.2 mg/dL   GFR calc non Af Amer >90 >90 mL/min   GFR calc Af Amer >90 >90 mL/min   Anion gap 6 5 - 15  Protime-INR     Status: None   Collection Time: 05/07/14  5:25 AM  Result Value Ref Range   Prothrombin Time 13.9 11.6 - 15.2 seconds   INR 1.05 0.00 - 1.49  CBC     Status: Abnormal   Collection Time: 05/07/14  5:25 AM  Result Value Ref Range   WBC 12.2 (H) 4.0 - 10.5 K/uL   RBC 4.31 4.22 - 5.81 MIL/uL   Hemoglobin 13.1 13.0 - 17.0 g/dL   HCT 39.9 39.0 - 52.0 %   MCV 92.6 78.0 - 100.0 fL   MCH 30.4 26.0 - 34.0 pg   MCHC 32.8 30.0 - 36.0 g/dL   RDW 13.4 11.5 - 15.5 %   Platelets 279 150 - 400 K/uL   No results found for this or any previous visit (from the past 240 hour(s)).  Renal Function:  Recent Labs  05/06/14 1925 05/07/14 0525  CREATININE 0.99 0.85    Estimated Creatinine Clearance: 122.2 mL/min (by C-G formula based on Cr of 0.85).  Radiologic Imaging: X-ray Chest Pa And Lateral  05/06/2014   CLINICAL DATA:  Cough.  Initial encounter.  EXAM: CHEST  2 VIEW  COMPARISON:  Chest radiograph from 05/17/2013  FINDINGS: The lungs are well-aerated. An apparent 1.0 cm nodule at the right lung apex is stable from the prior study and likely benign. There is no evidence of focal opacification, pleural effusion or pneumothorax.  The heart is normal in size; the mediastinal contour is within normal limits. Mild vascular congestion is noted. No acute osseous abnormalities are seen.  IMPRESSION: Mild vascular congestion noted; lungs remain grossly clear. Stable 1.0 cm nodule at the right lung apex, likely benign.   Electronically Signed   By: Garald Balding M.D.   On: 05/06/2014 23:49   Ct Renal Stone  Study  05/06/2014   CLINICAL DATA:  Urinary retention. The patient had a renal biopsy done yesterday.  EXAM: CT ABDOMEN AND PELVIS WITHOUT CONTRAST  TECHNIQUE: Multidetector CT imaging of the abdomen and pelvis was performed following the standard protocol without IV contrast.  COMPARISON:  January 28, 2014  FINDINGS: There is right perinephric stranding presumably reason biopsy change. There is a 3.2 x 2.3 cm hematoma in the posterior midpole right kidney with blood extending to the right renal pelvis and proximal right ureter. There is small free air and fluid surrounding the proximal portion of the right ureter which could be post biopsy change. The previously noted stranding surrounding the remainder of There is a small amount of right ureter of persists and appears more prominent probably due to surrounding fluid from recent biopsy.  There is right retroperitoneal lymphadenopathy unchanged. The left kidney is normal. The liver, spleen, pancreas, gallbladder, adrenal glands are normal. There is atherosclerosis of the abdominal aorta without aneurysmal dilatation.  There is no small bowel obstruction or diverticulitis. The appendix is normal.  The bladder is decompressed with Foley catheter in place. There is no pelvic lymphadenopathy. No acute abnormalities identified within the visualized bones. The lung bases are clear.  IMPRESSION: Post biopsy changes involving the right kidney and surrounding the proximal right ureter as described. There is a 3.2 x 2.3 cm hematoma in the posterior midpole right kidney with blood extending to the right renal pelvis and proximal right ureter. There is a small amount of free air and fluid surrounding the proximal portion of the right ureter which could be postoperative change.   Electronically Signed   By: Abelardo Diesel M.D.   On: 05/06/2014 19:31     I independently reviewed the above imaging studies.  Impression/Recommendation Right ureteral obstruction s/p recent ureteroscopic procedure for biopsy of right renal pelvic biopsy: Considering his uncontrolled pain, he does need cystoscopy and right ureteral stent placement to relieve obstruction.  Blood product in right renal collecting system is expected following biopsy. Pt expressed his frustration with the situation but also expressed his understanding and desire to proceed as recommended with the above procedure. I discussed the potential benefits and risks of the procedure, side effects of the proposed treatment, the likelihood of the patient achieving the goals of the procedure, and any potential problems that might occur during the procedure or recuperation. He gives his informed consent.  Prisila Dlouhy,LES 05/07/2014, 8:39 AM    Pryor Curia. MD   CC: Dr. Roel Cluck

## 2014-05-08 MED ORDER — OXYCODONE-ACETAMINOPHEN 5-325 MG PO TABS
1.0000 | ORAL_TABLET | Freq: Four times a day (QID) | ORAL | Status: DC | PRN
  Administered 2014-05-08 (×2): 2 via ORAL
  Filled 2014-05-08 (×2): qty 2

## 2014-05-08 MED ORDER — DOCUSATE SODIUM 100 MG PO CAPS
100.0000 mg | ORAL_CAPSULE | Freq: Two times a day (BID) | ORAL | Status: DC
  Administered 2014-05-08: 100 mg via ORAL
  Filled 2014-05-08 (×2): qty 1

## 2014-05-08 MED ORDER — OXYCODONE-ACETAMINOPHEN 5-325 MG PO TABS: 1.0000 | ORAL_TABLET | Freq: Four times a day (QID) | ORAL | Status: DC | PRN

## 2014-05-08 NOTE — Progress Notes (Signed)
Patient ID: Timothy Hickman, male   DOB: May 26, 1970, 43 y.o.   MRN: [id number]  1 Day Post-Op Subjective: Pt s/p ureteral stent yesterday.   Procedure was difficult due to severe edema of the ureter and some question of false ureteral passage at the UPJ.  His flank pain is improved but he does have persistent severe right lower quadrant pain.  He has still required IV pain medication overnight.   Objective: Vital signs in last 24 hours: Temp:  [97.9 F (36.6 C)-99.4 F (37.4 C)] 99.1 F (37.3 C) (01/24 0447) Pulse Rate:  [87-95] 87 (01/24 0447) Resp:  [17-22] 18 (01/24 0447) BP: (125-157)/(72-90) 132/72 mmHg (01/24 0447) SpO2:  [93 %-100 %] 94 % (01/24 0447) Weight:  [97.5 kg (214 lb 15.2 oz)] 97.5 kg (214 lb 15.2 oz) (01/23 2001)  Intake/Output from previous day: 01/23 0701 - 01/24 0700 In: 2500 [I.V.:1700] Out: 3550 [Urine:3550] Intake/Output this shift:    Physical Exam:  General: Alert and oriented Abdomen: Soft, ND GU: Catheter with grossly, dark red urine still  Lab Results:  Recent Labs  05/07/14 0045 05/07/14 0525 05/07/14 1158  HGB 13.1 13.1 13.7  HCT 38.5* 39.9 41.0   BMET  Recent Labs  05/06/14 1925 05/07/14 0525  NA 134* 134*  K 3.7 3.9  CL 101 102  CO2 23 26  GLUCOSE 107* 100*  BUN 8 8  CREATININE 0.99 0.85  CALCIUM 8.4 8.1*   I irrigated his catheter.  Catheter irrigated easily with NS.  No clots noted and urine clears quickly.   Assessment/Plan: 1) Right ureteral obstruction: S/P right ureteral stent. Main issue is now pain control.  Will attempt to adjust po pain medication and see if this will control his pain today.  He may be ready for discharge later today if pain is controlled.  If he continues to require IV pain medication, he will need to remain hospitalized.  His catheter has been removed this morning.  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  Pt scheduled for f/u with Dr. Gaynelle Arabian on 1/29.   LOS: 2 days    Jesika Men,LES 05/08/2014, 8:23 AM

## 2014-05-08 NOTE — Progress Notes (Signed)
Pt leaving at this time with his wife. Pt alert, oriented, and without c/o. Discharge instructions/prescription given/explained with pt verbalizing. Followup appointment noted for 05/13/14 with [facility].

## 2014-05-08 NOTE — Discharge Summary (Signed)
  Date of admission: 05/06/2014  Date of discharge: 05/08/2014  Admission diagnosis: hematuria  Discharge diagnosis: same. (upper tract renal colic from obstructing clot after ureteroscopic biopsy)  Secondary diagnoses: none  History and Physical: For full details, please see admission history and physical. Briefly, Timothy Hickman is a 44 y.o. year old patient with upper tract TCC. He underwent ureteroscopic biopsy, dilation of UPJ, and ureteral access sheath placement last week. He presented with flank pain. Please see H&P.  Hospital Course: He was taken for ureteroscopy and stent placement on 1/23. This was fairly complicated given the extreme swelling and poor visibility. Nevertheless, a stent was placed and his flank pain improved. He was discharged the next day with instructions to follow up with Dr. Gaynelle Arabian and to await further instructions regarding the treatment strategy for his UTUC.  Laboratory values:  Recent Labs  05/07/14 0045 05/07/14 0525 05/07/14 1158  HGB 13.1 13.1 13.7  HCT 38.5* 39.9 41.0    Disposition: Home  Discharge instruction: The patient was instructed to be ambulatory but to refrain from heavy lifting, strenuous activity, or driving.  1. You may see some blood in the urine and may have some burning with urination for 48-72 hours. You also may notice that you have to urinate more frequently or urgently after your procedure which is normal.  2. You should call should you develop an inability urinate, fever > 101, persistent nausea and vomiting that prevents you from eating or drinking to stay hydrated.  3. If you have a stent, you will likely urinate more frequently and urgently until the stent is removed and you may experience some discomfort/pain in the lower abdomen and flank especially when urinating. You may take pain medication prescribed to you if needed for pain. You may also intermittently have blood in the urine until the stent is removed. 4. If you  have a catheter, you will be taught how to take care of the catheter by the nursing staff prior to discharge from the hospital.  You may periodically feel a strong urge to void with the catheter in place.  This is a bladder spasm and most often can occur when having a bowel movement or moving around. It is typically self-limited and usually will stop after a few minutes.  You may use some Vaseline or Neosporin around the tip of the catheter to reduce friction at the tip of the penis. You may also see some blood in the urine.  A very small amount of blood can make the urine look quite red.  As long as the catheter is draining well, there usually is not a problem.  However, if the catheter is not draining well and is bloody, you should call the office [phone]) to notify us.  Discharge medications:  See medication reconciliation..med  Followup:  Follow-up Information    Follow up with Ailene Rud, MD.   Specialty:  Urology   Why:  1/29 as scheduled   Contact information:   [place] [phone]

## 2014-05-08 NOTE — Discharge Instructions (Signed)

## 2014-05-09 ENCOUNTER — Encounter (HOSPITAL_COMMUNITY): Payer: Self-pay | Admitting: Urology

## 2014-05-18 ENCOUNTER — Other Ambulatory Visit: Payer: Self-pay | Admitting: Urology

## 2014-05-25 ENCOUNTER — Other Ambulatory Visit: Payer: Self-pay

## 2014-05-25 ENCOUNTER — Encounter (HOSPITAL_COMMUNITY): Payer: Self-pay

## 2014-05-25 ENCOUNTER — Encounter (HOSPITAL_COMMUNITY)
Admission: RE | Admit: 2014-05-25 | Discharge: 2014-05-25 | Disposition: A | Discharge status: Home / Self Care | Payer: No Typology Code available for payment source | Source: Ambulatory Visit | Attending: Urology | Admitting: Urology

## 2014-05-25 ENCOUNTER — Other Ambulatory Visit: Payer: Self-pay | Admitting: Urology

## 2014-05-25 HISTORY — DX: Chronic kidney disease, unspecified: N18.9

## 2014-05-25 LAB — ABO/RH: ABO/RH(D): O POS

## 2014-05-25 NOTE — Progress Notes (Addendum)
CXR epic 05/06/2014 Labs epic 05/07/2014 CBC,CMET,PT INR, TSH, Magnesium, Phophorus Pt states is to begin clear liquid diet beginning midnight 05/25/2014 list of clear liquid diet given per instruction sheet - instructions given as to nothing by mouth beginning midnight night prior to surgery

## 2014-05-25 NOTE — Patient Instructions (Signed)
[place]  05/25/2014   Your procedure is scheduled on:       Friday May 27, 2014   Report to [facility] Main Entrance and follow signs to  [facility] arrive at 10:45 AM.   Call this number if you have problems the morning of surgery [phone] or Presurgical Testing [phone].   Remember:  Do not eat food or drink liquids :After Midnight.              Remember: follow any bowel prep instructions per MD office.    Take these medicines the morning of surgery with A SIP OF WATER: Oxycodone-Acetaminophen if needed                               You may not have any metal on your body including hair pins and piercings  Do not wear jewelry,cologne, lotions, powders, or deodorant.  Men may shave face and neck.               Do not bring valuables to the hospital. [place].  Contacts, dentures or bridgework may not be worn into surgery.  Leave suitcase in the car. After surgery it may be brought to your room.  For patients admitted to the hospital, checkout time is 11:00 AM the day of discharge.   Special Instructions: review fact sheets for Blood Transfusion fact sheet  Remember: Type/Screen "Blue armsbands"- may not be removed once applied(would result in being retested AM of surgery, if removed). ________________________________________________________________________  [facility] - Preparing for Surgery Before surgery, you can play an important role.  Because skin is not sterile, your skin needs to be as free of germs as possible.  You can reduce the number of germs on your skin by washing with CHG (chlorahexidine gluconate) soap before surgery.  CHG is an antiseptic cleaner which kills germs and bonds with the skin to continue killing germs even after washing. Please DO NOT use if you have an allergy to CHG or antibacterial soaps.  If your skin becomes reddened/irritated stop using the CHG and inform your nurse when you  arrive at Short Stay. Do not shave (including legs and underarms) for at least 48 hours prior to the first CHG shower.  You may shave your face/neck. Please follow these instructions carefully:  1.  Shower with CHG Soap the night before surgery and the  morning of Surgery.  2.  If you choose to wash your hair, wash your hair first as usual with your  normal  shampoo.  3.  After you shampoo, rinse your hair and body thoroughly to remove the  shampoo.                           4.  Use CHG as you would any other liquid soap.  You can apply chg directly  to the skin and wash                       Gently with a scrungie or clean washcloth.  5.  Apply the CHG Soap to your body ONLY FROM THE NECK DOWN.   Do not use on face/ open                           Wound or open  sores. Avoid contact with eyes, ears mouth and genitals (private parts).                       Wash face,  Genitals (private parts) with your normal soap.             6.  Wash thoroughly, paying special attention to the area where your surgery  will be performed.  7.  Thoroughly rinse your body with warm water from the neck down.  8.  DO NOT shower/wash with your normal soap after using and rinsing off  the CHG Soap.                9.  Pat yourself dry with a clean towel.            10.  Wear clean pajamas.            11.  Place clean sheets on your bed the night of your first shower and do not  sleep with pets. Day of Surgery : Do not apply any lotions/deodorants the morning of surgery.  Please wear clean clothes to the hospital/surgery center.  FAILURE TO FOLLOW THESE INSTRUCTIONS MAY RESULT IN THE CANCELLATION OF YOUR SURGERY PATIENT SIGNATURE_________________________________  NURSE SIGNATURE__________________________________  ________________________________________________________________________    CLEAR LIQUID DIET   Foods Allowed                                                                     Foods Excluded  Coffee  and tea, regular and decaf                             liquids that you cannot  Plain Jell-O in any flavor                                             see through such as: Fruit ices (not with fruit pulp)                                     milk, soups, orange juice  Iced Popsicles                                    All solid food Carbonated beverages, regular and diet                                    Cranberry, grape and apple juices Sports drinks like Gatorade Lightly seasoned clear broth or consume(fat free) Sugar, honey syrup  Sample Menu Breakfast                                Lunch  Supper Cranberry juice                    Beef broth                            Chicken broth Jell-O                                     Grape juice                           Apple juice Coffee or tea                        Jell-O                                      Popsicle                                                Coffee or tea                        Coffee or tea  _____________________________________________________________________   WHAT IS A BLOOD TRANSFUSION? Blood Transfusion Information  A transfusion is the replacement of blood or some of its parts. Blood is made up of multiple cells which provide different functions.  Red blood cells carry oxygen and are used for blood loss replacement.  White blood cells fight against infection.  Platelets control bleeding.  Plasma helps clot blood.  Other blood products are available for specialized needs, such as hemophilia or other clotting disorders. BEFORE THE TRANSFUSION  Who gives blood for transfusions?   Healthy volunteers who are fully evaluated to make sure their blood is safe. This is blood bank blood. Transfusion therapy is the safest it has ever been in the practice of medicine. Before blood is taken from a donor, a complete history is taken to make sure that person has no history of diseases  nor engages in risky social behavior (examples are intravenous drug use or sexual activity with multiple partners). The donor's travel history is screened to minimize risk of transmitting infections, such as malaria. The donated blood is tested for signs of infectious diseases, such as HIV and hepatitis. The blood is then tested to be sure it is compatible with you in order to minimize the chance of a transfusion reaction. If you or a relative donates blood, this is often done in anticipation of surgery and is not appropriate for emergency situations. It takes many days to process the donated blood. RISKS AND COMPLICATIONS Although transfusion therapy is very safe and saves many lives, the main dangers of transfusion include:   Getting an infectious disease.  Developing a transfusion reaction. This is an allergic reaction to something in the blood you were given. Every precaution is taken to prevent this. The decision to have a blood transfusion has been considered carefully by your caregiver before blood is given. Blood is not given unless the benefits outweigh the risks. AFTER THE TRANSFUSION  Right after receiving a blood transfusion, you will usually  feel much better and more energetic. This is especially true if your red blood cells have gotten low (anemic). The transfusion raises the level of the red blood cells which carry oxygen, and this usually causes an energy increase.  The nurse administering the transfusion will monitor you carefully for complications. HOME CARE INSTRUCTIONS  No special instructions are needed after a transfusion. You may find your energy is better. Speak with your caregiver about any limitations on activity for underlying diseases you may have. SEEK MEDICAL CARE IF:   Your condition is not improving after your transfusion.  You develop redness or irritation at the intravenous (IV) site. SEEK IMMEDIATE MEDICAL CARE IF:  Any of the following symptoms occur over the  next 12 hours:  Shaking chills.  You have a temperature by mouth above 102 F (38.9 C), not controlled by medicine.  Chest, back, or muscle pain.  People around you feel you are not acting correctly or are confused.  Shortness of breath or difficulty breathing.  Dizziness and fainting.  You get a rash or develop hives.  You have a decrease in urine output.  Your urine turns a dark color or changes to pink, red, or brown. Any of the following symptoms occur over the next 10 days:  You have a temperature by mouth above 102 F (38.9 C), not controlled by medicine.  Shortness of breath.  Weakness after normal activity.  The white part of the eye turns yellow (jaundice).  You have a decrease in the amount of urine or are urinating less often.  Your urine turns a dark color or changes to pink, red, or brown. Document Released: 03/29/2000 Document Revised: 06/24/2011 Document Reviewed: 11/16/2007 [facility] Patient Information 2014 [place], [place].  _______________________________________________________________________

## 2014-05-25 NOTE — Progress Notes (Signed)
Pt had sore per right forearm. Pt stated he had rubbed it while at work causing the area. He stated his mother "cleaned a pin with alcohol and pricked it". Pt stated he is cleaning area with alcohol and applying neosporin. Denied drainage from area. Area with small opening with slight redness noted. Covered with bandaid.

## 2014-05-27 ENCOUNTER — Inpatient Hospital Stay (HOSPITAL_COMMUNITY): Payer: No Typology Code available for payment source | Admitting: *Deleted

## 2014-05-27 ENCOUNTER — Encounter (HOSPITAL_COMMUNITY): Payer: Self-pay | Admitting: *Deleted

## 2014-05-27 ENCOUNTER — Encounter (HOSPITAL_COMMUNITY)
Admission: RE | Disposition: A | Discharge status: Home / Self Care | Payer: Self-pay | Source: Ambulatory Visit | Attending: Urology

## 2014-05-27 ENCOUNTER — Inpatient Hospital Stay (HOSPITAL_COMMUNITY)
Admission: RE | Admit: 2014-05-27 | Discharge: 2014-05-29 | DRG: 658 | Disposition: A | Discharge status: Home / Self Care | Payer: No Typology Code available for payment source | Source: Ambulatory Visit | Attending: Urology | Admitting: Urology

## 2014-05-27 DIAGNOSIS — N2889 Other specified disorders of kidney and ureter: Secondary | ICD-10-CM | POA: Diagnosis present

## 2014-05-27 DIAGNOSIS — F17210 Nicotine dependence, cigarettes, uncomplicated: Secondary | ICD-10-CM | POA: Diagnosis present

## 2014-05-27 DIAGNOSIS — Z888 Allergy status to other drugs, medicaments and biological substances status: Secondary | ICD-10-CM

## 2014-05-27 DIAGNOSIS — Z6832 Body mass index (BMI) 32.0-32.9, adult: Secondary | ICD-10-CM | POA: Diagnosis not present

## 2014-05-27 DIAGNOSIS — C651 Malignant neoplasm of right renal pelvis: Principal | ICD-10-CM | POA: Diagnosis present

## 2014-05-27 DIAGNOSIS — Z830 Family history of human immunodeficiency virus [HIV] disease: Secondary | ICD-10-CM | POA: Diagnosis not present

## 2014-05-27 DIAGNOSIS — N189 Chronic kidney disease, unspecified: Secondary | ICD-10-CM | POA: Diagnosis present

## 2014-05-27 DIAGNOSIS — E669 Obesity, unspecified: Secondary | ICD-10-CM | POA: Diagnosis present

## 2014-05-27 DIAGNOSIS — Z803 Family history of malignant neoplasm of breast: Secondary | ICD-10-CM | POA: Diagnosis not present

## 2014-05-27 HISTORY — PX: ROBOT ASSITED LAPAROSCOPIC NEPHROURETERECTOMY: SHX6077

## 2014-05-27 LAB — TYPE AND SCREEN
ABO/RH(D): O POS
Antibody Screen: NEGATIVE

## 2014-05-27 LAB — HEMOGLOBIN AND HEMATOCRIT, BLOOD
HCT: 41.8 % (ref 39.0–52.0)
Hemoglobin: 14 g/dL (ref 13.0–17.0)

## 2014-05-27 SURGERY — ROBOT ASSITED LAPAROSCOPIC NEPHROURETERECTOMY
Anesthesia: General | Laterality: Right

## 2014-05-27 MED ORDER — NEOSTIGMINE METHYLSULFATE 10 MG/10ML IV SOLN
INTRAVENOUS | Status: AC
  Filled 2014-05-27: qty 1

## 2014-05-27 MED ORDER — LACTATED RINGERS IR SOLN
Status: DC | PRN
  Administered 2014-05-27: 1000 mL

## 2014-05-27 MED ORDER — ACETAMINOPHEN 500 MG PO TABS
1000.0000 mg | ORAL_TABLET | Freq: Four times a day (QID) | ORAL | Status: AC
  Administered 2014-05-27 – 2014-05-28 (×4): 1000 mg via ORAL
  Filled 2014-05-27 (×5): qty 2

## 2014-05-27 MED ORDER — PROMETHAZINE HCL 25 MG/ML IJ SOLN
6.2500 mg | INTRAMUSCULAR | Status: DC | PRN
Start: 2014-05-27 — End: 2014-05-27

## 2014-05-27 MED ORDER — ONDANSETRON HCL 4 MG/2ML IJ SOLN
INTRAMUSCULAR | Status: DC | PRN
  Administered 2014-05-27 (×2): 4 mg via INTRAVENOUS

## 2014-05-27 MED ORDER — PROPOFOL 10 MG/ML IV BOLUS
INTRAVENOUS | Status: DC | PRN
  Administered 2014-05-27: 200 mg via INTRAVENOUS

## 2014-05-27 MED ORDER — PHENYLEPHRINE 40 MCG/ML (10ML) SYRINGE FOR IV PUSH (FOR BLOOD PRESSURE SUPPORT)
PREFILLED_SYRINGE | INTRAVENOUS | Status: AC
Start: 2014-05-27 — End: 2014-05-27
  Filled 2014-05-27: qty 10

## 2014-05-27 MED ORDER — CEFAZOLIN SODIUM-DEXTROSE 2-3 GM-% IV SOLR
INTRAVENOUS | Status: AC
  Filled 2014-05-27: qty 50

## 2014-05-27 MED ORDER — ESMOLOL HCL 10 MG/ML IV SOLN
INTRAVENOUS | Status: DC | PRN
  Administered 2014-05-27: 20 mg via INTRAVENOUS

## 2014-05-27 MED ORDER — TAMSULOSIN HCL 0.4 MG PO CAPS
0.4000 mg | ORAL_CAPSULE | Freq: Every day | ORAL | Status: DC
  Administered 2014-05-27: 0.4 mg via ORAL
  Filled 2014-05-27 (×3): qty 1

## 2014-05-27 MED ORDER — BUPIVACAINE LIPOSOME 1.3 % IJ SUSP
20.0000 mL | Freq: Once | INTRAMUSCULAR | Status: AC
  Administered 2014-05-27: 20 mL
  Filled 2014-05-27: qty 20

## 2014-05-27 MED ORDER — WATER FOR IRRIGATION, STERILE IR SOLN
Status: DC | PRN
  Administered 2014-05-27: 3000 mL

## 2014-05-27 MED ORDER — HYDROMORPHONE HCL 1 MG/ML IJ SOLN
0.5000 mg | INTRAMUSCULAR | Status: DC | PRN
Start: 2014-05-27 — End: 2014-05-29
  Administered 2014-05-27 – 2014-05-29 (×7): 1 mg via INTRAVENOUS
  Filled 2014-05-27 (×8): qty 1

## 2014-05-27 MED ORDER — ESMOLOL HCL 10 MG/ML IV SOLN
INTRAVENOUS | Status: AC
  Filled 2014-05-27: qty 10

## 2014-05-27 MED ORDER — MIDAZOLAM HCL 5 MG/5ML IJ SOLN
INTRAMUSCULAR | Status: DC | PRN
  Administered 2014-05-27: 2 mg via INTRAVENOUS

## 2014-05-27 MED ORDER — ROCURONIUM BROMIDE 100 MG/10ML IV SOLN
INTRAVENOUS | Status: AC
  Filled 2014-05-27: qty 1

## 2014-05-27 MED ORDER — GLYCOPYRROLATE 0.2 MG/ML IJ SOLN
INTRAMUSCULAR | Status: AC
  Filled 2014-05-27: qty 4

## 2014-05-27 MED ORDER — METOCLOPRAMIDE HCL 5 MG/ML IJ SOLN
INTRAMUSCULAR | Status: DC | PRN
Start: 2014-05-27 — End: 2014-05-27
  Administered 2014-05-27: 10 mg via INTRAVENOUS

## 2014-05-27 MED ORDER — FENTANYL CITRATE 0.05 MG/ML IJ SOLN
INTRAMUSCULAR | Status: AC
  Filled 2014-05-27: qty 5

## 2014-05-27 MED ORDER — CEFAZOLIN SODIUM-DEXTROSE 2-3 GM-% IV SOLR
2.0000 g | INTRAVENOUS | Status: AC
  Administered 2014-05-27: 2 g via INTRAVENOUS

## 2014-05-27 MED ORDER — KETAMINE HCL 10 MG/ML IJ SOLN
INTRAMUSCULAR | Status: DC | PRN
  Administered 2014-05-27 (×2): 10 mg via INTRAVENOUS
  Administered 2014-05-27: 20 mg via INTRAVENOUS
  Administered 2014-05-27: 10 mg via INTRAVENOUS
  Administered 2014-05-27: 30 mg via INTRAVENOUS

## 2014-05-27 MED ORDER — PHENYLEPHRINE HCL 10 MG/ML IJ SOLN
INTRAMUSCULAR | Status: DC | PRN
  Administered 2014-05-27 (×3): 80 ug via INTRAVENOUS

## 2014-05-27 MED ORDER — HYDROMORPHONE HCL 1 MG/ML IJ SOLN
INTRAMUSCULAR | Status: AC
  Filled 2014-05-27: qty 1

## 2014-05-27 MED ORDER — GLYCOPYRROLATE 0.2 MG/ML IJ SOLN
INTRAMUSCULAR | Status: DC | PRN
Start: 2014-05-27 — End: 2014-05-27
  Administered 2014-05-27: .8 mg via INTRAVENOUS

## 2014-05-27 MED ORDER — OXYCODONE HCL 5 MG PO TABS
5.0000 mg | ORAL_TABLET | ORAL | Status: DC | PRN
  Administered 2014-05-28: 5 mg via ORAL
  Filled 2014-05-27: qty 1

## 2014-05-27 MED ORDER — METOCLOPRAMIDE HCL 5 MG/ML IJ SOLN
INTRAMUSCULAR | Status: AC
  Filled 2014-05-27: qty 2

## 2014-05-27 MED ORDER — PROPOFOL 10 MG/ML IV BOLUS
INTRAVENOUS | Status: AC
  Filled 2014-05-27: qty 20

## 2014-05-27 MED ORDER — ONDANSETRON HCL 4 MG/2ML IJ SOLN
INTRAMUSCULAR | Status: AC
  Filled 2014-05-27: qty 2

## 2014-05-27 MED ORDER — FENTANYL CITRATE 0.05 MG/ML IJ SOLN
INTRAMUSCULAR | Status: DC | PRN
  Administered 2014-05-27 (×4): 50 ug via INTRAVENOUS
  Administered 2014-05-27: 100 ug via INTRAVENOUS
  Administered 2014-05-27 (×4): 50 ug via INTRAVENOUS

## 2014-05-27 MED ORDER — ROCURONIUM BROMIDE 100 MG/10ML IV SOLN
INTRAVENOUS | Status: DC | PRN
  Administered 2014-05-27: 50 mg via INTRAVENOUS
  Administered 2014-05-27 (×2): 20 mg via INTRAVENOUS
  Administered 2014-05-27: 10 mg via INTRAVENOUS
  Administered 2014-05-27: 20 mg via INTRAVENOUS
  Administered 2014-05-27: 10 mg via INTRAVENOUS

## 2014-05-27 MED ORDER — MEPERIDINE HCL 50 MG/ML IJ SOLN: 6.2500 mg | INTRAMUSCULAR | Status: DC | PRN

## 2014-05-27 MED ORDER — OXYCODONE-ACETAMINOPHEN 5-325 MG PO TABS: 1.0000 | ORAL_TABLET | Freq: Four times a day (QID) | ORAL | Status: DC | PRN

## 2014-05-27 MED ORDER — SODIUM CHLORIDE 0.9 % IJ SOLN
INTRAMUSCULAR | Status: AC
  Filled 2014-05-27: qty 20

## 2014-05-27 MED ORDER — DEXAMETHASONE SODIUM PHOSPHATE 4 MG/ML IJ SOLN
INTRAMUSCULAR | Status: DC | PRN
  Administered 2014-05-27: 10 mg via INTRAVENOUS

## 2014-05-27 MED ORDER — KETAMINE HCL 10 MG/ML IJ SOLN
INTRAMUSCULAR | Status: AC
  Filled 2014-05-27: qty 1

## 2014-05-27 MED ORDER — ALBUTEROL SULFATE HFA 108 (90 BASE) MCG/ACT IN AERS
INHALATION_SPRAY | RESPIRATORY_TRACT | Status: DC | PRN
  Administered 2014-05-27 (×2): 5 via RESPIRATORY_TRACT

## 2014-05-27 MED ORDER — NEOSTIGMINE METHYLSULFATE 10 MG/10ML IV SOLN
INTRAVENOUS | Status: DC | PRN
  Administered 2014-05-27: 5 mg via INTRAVENOUS

## 2014-05-27 MED ORDER — LACTATED RINGERS IV SOLN
INTRAVENOUS | Status: DC
  Administered 2014-05-27 (×2): via INTRAVENOUS
  Administered 2014-05-27: 1000 mL via INTRAVENOUS

## 2014-05-27 MED ORDER — MIDAZOLAM HCL 2 MG/2ML IJ SOLN
INTRAMUSCULAR | Status: AC
  Filled 2014-05-27: qty 2

## 2014-05-27 MED ORDER — DEXTROSE-NACL 5-0.45 % IV SOLN
INTRAVENOUS | Status: DC
Start: 2014-05-27 — End: 2014-05-28
  Administered 2014-05-27 – 2014-05-28 (×2): via INTRAVENOUS

## 2014-05-27 MED ORDER — HYDROMORPHONE HCL 1 MG/ML IJ SOLN
0.2500 mg | INTRAMUSCULAR | Status: DC | PRN
  Administered 2014-05-27 (×3): 0.5 mg via INTRAVENOUS

## 2014-05-27 SURGICAL SUPPLY — 83 items
CANNULA SEAL DVNC (CANNULA) IMPLANT
CANNULA SEALS DA VINCI (CANNULA)
CATH FOLEY 3WAY  5CC 18FR (CATHETERS)
CATH FOLEY 3WAY 5CC 18FR (CATHETERS) IMPLANT
CHLORAPREP W/TINT 26ML (MISCELLANEOUS) ×3 IMPLANT
CLIP LIGATING HEM O LOK PURPLE (MISCELLANEOUS) ×3 IMPLANT
CLIP LIGATING HEMO LOK XL GOLD (MISCELLANEOUS) ×6 IMPLANT
CLIP LIGATING HEMO O LOK GREEN (MISCELLANEOUS) ×3 IMPLANT
CORDS BIPOLAR (ELECTRODE) IMPLANT
COVER SURGICAL LIGHT HANDLE (MISCELLANEOUS) IMPLANT
COVER TIP SHEARS 8 DVNC (MISCELLANEOUS) ×1 IMPLANT
COVER TIP SHEARS 8MM DA VINCI (MISCELLANEOUS) ×2
CUTTER ECHEON FLEX ENDO 45 340 (ENDOMECHANICALS) ×3 IMPLANT
DECANTER SPIKE VIAL GLASS SM (MISCELLANEOUS) ×3 IMPLANT
DERMABOND ADVANCED (GAUZE/BANDAGES/DRESSINGS) ×2
DERMABOND ADVANCED .7 DNX12 (GAUZE/BANDAGES/DRESSINGS) ×1 IMPLANT
DRAIN CHANNEL 15F RND FF 3/16 (WOUND CARE) ×3 IMPLANT
DRAPE ARM DVNC X/XI (DISPOSABLE) ×4 IMPLANT
DRAPE COLUMN DVNC XI (DISPOSABLE) ×1 IMPLANT
DRAPE DA VINCI XI ARM (DISPOSABLE) ×8
DRAPE DA VINCI XI COLUMN (DISPOSABLE) ×2
DRAPE INCISE IOBAN 66X45 STRL (DRAPES) ×3 IMPLANT
DRAPE LAPAROSCOPIC ABDOMINAL (DRAPES) ×3 IMPLANT
DRAPE SHEET LG 3/4 BI-LAMINATE (DRAPES) IMPLANT
DRAPE TABLE BACK 44X90 PK DISP (DRAPES) IMPLANT
DRAPE WARM FLUID 44X44 (DRAPE) ×3 IMPLANT
DRESSING SURGICEL FIBRLLR 1X2 (HEMOSTASIS) IMPLANT
DRSG SURGICEL FIBRILLAR 1X2 (HEMOSTASIS)
ELECT REM PT RETURN 9FT ADLT (ELECTROSURGICAL) ×3
ELECTRODE REM PT RTRN 9FT ADLT (ELECTROSURGICAL) ×1 IMPLANT
EVACUATOR SILICONE 100CC (DRAIN) ×3 IMPLANT
GLOVE BIOGEL M STRL SZ7.5 (GLOVE) ×9 IMPLANT
GOWN STRL REUS W/TWL LRG LVL3 (GOWN DISPOSABLE) ×9 IMPLANT
GOWN STRL REUS W/TWL XL LVL3 (GOWN DISPOSABLE) ×6 IMPLANT
HEMOSTAT SURGICEL 4X8 (HEMOSTASIS) IMPLANT
KIT ACCESSORY DA VINCI DISP (KITS) ×2
KIT ACCESSORY DVNC DISP (KITS) ×1 IMPLANT
KIT BASIN OR (CUSTOM PROCEDURE TRAY) ×3 IMPLANT
LIQUID BAND (GAUZE/BANDAGES/DRESSINGS) ×6 IMPLANT
LOOP VESSEL MAXI BLUE (MISCELLANEOUS) ×3 IMPLANT
NEEDLE INSUFFLATION 14GA 120MM (NEEDLE) ×3 IMPLANT
PAD POSITIONING PINK XL (MISCELLANEOUS) IMPLANT
PENCIL BUTTON HOLSTER BLD 10FT (ELECTRODE) ×3 IMPLANT
POSITIONER SURGICAL ARM (MISCELLANEOUS) IMPLANT
POUCH ENDO CATCH II 15MM (MISCELLANEOUS) ×3 IMPLANT
POUCH SPECIMEN RETRIEVAL 10MM (ENDOMECHANICALS) IMPLANT
RELOAD STAPLER WHITE 60MM (STAPLE) IMPLANT
RELOAD WH ECHELON 45 (STAPLE) ×9 IMPLANT
RETRACT II ENDO 10MM 32CML (ENDOMECHANICALS) ×3
RETRACTOR II ENDO 10MM 32CML (ENDOMECHANICALS) ×1 IMPLANT
RETRACTOR LAPSCP 12X46 CVD (ENDOMECHANICALS) ×1 IMPLANT
RTRCTR LAPSCP 12X46 CVD (ENDOMECHANICALS) ×3
SEAL CANN UNIV 5-8 DVNC XI (MISCELLANEOUS) ×4 IMPLANT
SEAL XI 5MM-8MM UNIVERSAL (MISCELLANEOUS) ×8
SET TUBE IRRIG SUCTION NO TIP (IRRIGATION / IRRIGATOR) ×3 IMPLANT
SHEET LAVH (DRAPES) IMPLANT
SOLUTION ANTI FOG 6CC (MISCELLANEOUS) ×3 IMPLANT
SOLUTION ELECTROLUBE (MISCELLANEOUS) ×3 IMPLANT
SPONGE LAP 18X18 X RAY DECT (DISPOSABLE) ×3 IMPLANT
SPONGE LAP 4X18 X RAY DECT (DISPOSABLE) ×3 IMPLANT
STAPLE ECHEON FLEX 60 POW ENDO (STAPLE) ×3 IMPLANT
STAPLER RELOAD WHITE 60MM (STAPLE)
SURGILUBE 3G PEEL PACK STRL (MISCELLANEOUS) ×3 IMPLANT
SUT ETHILON 3 0 PS 1 (SUTURE) ×6 IMPLANT
SUT MNCRL AB 4-0 PS2 18 (SUTURE) ×6 IMPLANT
SUT MON AB 2-0 SH 27 (SUTURE) ×2
SUT MON AB 2-0 SH27 (SUTURE) ×1 IMPLANT
SUT PDS AB 1 CT1 27 (SUTURE) ×6 IMPLANT
SUT PDS AB 1 CTX 36 (SUTURE) ×9 IMPLANT
SUT VIC AB 2-0 SH 27 (SUTURE)
SUT VIC AB 2-0 SH 27X BRD (SUTURE) IMPLANT
SUT VICRYL 0 UR6 27IN ABS (SUTURE) ×3 IMPLANT
SUT VLOC BARB 180 ABS3/0GR12 (SUTURE) ×3
SUTURE VLOC BRB 180 ABS3/0GR12 (SUTURE) ×1 IMPLANT
TOWEL OR NON WOVEN STRL DISP B (DISPOSABLE) ×3 IMPLANT
TRAY LAPAROSCOPIC (CUSTOM PROCEDURE TRAY) ×3 IMPLANT
TROCAR 12M 150ML BLUNT (TROCAR) ×3 IMPLANT
TROCAR BLADELESS OPT 5 75 (ENDOMECHANICALS) IMPLANT
TROCAR XCEL 12X100 BLDLESS (ENDOMECHANICALS) ×3 IMPLANT
TUBING INSUFFLATION 10FT LAP (TUBING) ×3 IMPLANT
URINEMETER 200ML W/220 (MISCELLANEOUS) IMPLANT
WATER STERILE IRR 1000ML UROMA (IV SOLUTION) IMPLANT
WATER STERILE IRR 1500ML POUR (IV SOLUTION) ×6 IMPLANT

## 2014-05-27 NOTE — Anesthesia Preprocedure Evaluation (Signed)
Anesthesia Evaluation  Patient identified by MRN, date of birth, ID band Patient awake    Reviewed: Allergy & Precautions, NPO status , Patient's Chart, lab work & pertinent test results  Airway Mallampati: II  TM Distance: >3 FB Neck ROM: Full    Dental no notable dental hx. (+) Teeth Intact, Dental Advisory Given Currently on penicillin for left upper rear dental infection. He may have lost a filling he thinks.:   Pulmonary Current Smoker,  bronchitis breath sounds clear to auscultation  Pulmonary exam normal       Cardiovascular hypertension, + Valvular Problems/Murmurs Rhythm:Regular Rate:Normal  Borderline htn   Neuro/Psych negative neurological ROS  negative psych ROS   GI/Hepatic negative GI ROS, Neg liver ROS,   Endo/Other  negative endocrine ROS  Renal/GU Renal disease     Musculoskeletal negative musculoskeletal ROS (+)   Abdominal (+) + obese,   Peds  Hematology negative hematology ROS (+)   Anesthesia Other Findings   Reproductive/Obstetrics negative OB ROS                             Anesthesia Physical  Anesthesia Plan  ASA: II  Anesthesia Plan: General   Post-op Pain Management:    Induction: Intravenous  Airway Management Planned: Oral ETT  Additional Equipment: None  Intra-op Plan:   Post-operative Plan: Extubation in OR  Informed Consent: I have reviewed the patients History and Physical, chart, labs and discussed the procedure including the risks, benefits and alternatives for the proposed anesthesia with the patient or authorized representative who has indicated his/her understanding and acceptance.   Dental advisory given  Plan Discussed with: CRNA  Anesthesia Plan Comments:         Anesthesia Quick Evaluation

## 2014-05-27 NOTE — H&P (Signed)
Timothy Hickman is an 44 y.o. male.    Chief Complaint: Pre-op Right Nephroureterectomy  HPI:   1 - Right Renal Pelvis Cancer - large volume urothelial carcinoma of right renal pelvis by ureteroscopic biopsy 05/2014 on eval hematuria and right flank pain. CT without obvious distant disease but some borderline regional lymph nodes (paracaval). NO contralateral lesions or bladder lesions. CMP normal, Cr <1. Presently has Rt ureteral stent in place. Appears 1 artery / 1 vein right renovascular anatomy.  PMH unremarkable. NO CV disease. No blood thinners. No current PCP.  Today " [name] " is seen to proceed with right nephroureterectomy. No interval fevers.   Past Medical History  Diagnosis Date  . Right renal mass   . Borderline hypertension   . Bronchitis   . Hematuria   . 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 Chronic kidney disease   . Cancer   . Fracture of finger of right hand     hx of     Past Surgical History  Procedure Laterality Date  . Cystoscopy with ureteroscopy Right 05/05/2014    Procedure: CYSTOSCOPY/ RIGHT URETEROSCOPY/ RIGHT RETROGRADE PYLEGRAM WITH INTERPRETATION/ RIGHT URETERAL BALLOON DILATION OF UPJ OBSTRUCTION/ BIOPSY OF RIGHT RENAL PELVIC MASS;  Surgeon: Ailene Rud, MD;  Location: [facility];  Service: Urology;  Laterality: Right;  . Cystoscopy w/ ureteral stent placement Right 05/07/2014    Procedure: CYSTOSCOPY WITH RETROGRADE PYELOGRAM/RIGHT DIAGNOSTIC URETEROSCOPY, RIGHT URETERAL STENT PLACEMENT;  Surgeon: Raynelle Bring, MD;  Location: [facility];  Service: Urology;  Laterality: Right;    Family History  Problem Relation Age of Onset  . Breast cancer Mother   . HIV/AIDS Father    Social History:  reports that he has been smoking Cigarettes.  He has a 10 pack-year smoking history. He has never used smokeless tobacco. He reports that he drinks alcohol. He reports that he does not use illicit  drugs.  Allergies:  Allergies  Allergen Reactions  . Valium Swelling    Face and hands    No prescriptions prior to admission    Results for orders placed or performed in visit on 05/25/14 (from the past 48 hour(s))  ABO/Rh     Status: None   Collection Time: 05/25/14  9:00 AM  Result Value Ref Range   ABO/RH(D) O POS    No results found.  Review of Systems  Constitutional: Negative.   HENT: Negative.   Eyes: Negative.   Respiratory: Negative.   Cardiovascular: Negative.   Gastrointestinal: Negative.   Genitourinary: Positive for hematuria and flank pain.  Musculoskeletal: Negative.   Skin: Negative.   Neurological: Negative.   Endo/Heme/Allergies: Negative.   Psychiatric/Behavioral: Negative.     There were no vitals taken for this visit. Physical Exam  Constitutional: He appears well-developed.  HENT:  Head: Normocephalic.  Eyes: Pupils are equal, round, and reactive to light.  Neck: Normal range of motion.  Cardiovascular: Normal rate.   Respiratory: Effort normal.  GI: Soft.  Genitourinary:  No CVAT  Musculoskeletal: Normal range of motion.  Neurological: He is alert.  Skin: Skin is warm.  Psychiatric: He has a normal mood and affect. His behavior is normal. Judgment and thought content normal.     Assessment/Plan  1 - Right Renal Pelvis Cancer - agree right nephroureterectomy with possible ipsilateral retroperitoneal lymph node dissection optimal treatment as large volume cancer in young man with excellent overall  kidney function.  We rediscussed the role of radical nephroureterectomy with the overall goal of complete surgical excision (negative margins) and better staging / diagnosis. We specifically rediscussed that with removal of the kidney there would be an overall renal function decline with attendant risks of renal failure and need for dialysis in some cases, and need for kidney-friendly lifestyle post-op with excellent blood pressure and glycemic  control. We then rediscussed surgical approaches including robotic and open techniques with robotic associated with a shorter convalescence. I showed the patient on their abdomen the approximately 4-6 incision (trocar) sites as well as presumed extraction sites with robotic approach as well as possible open incision sites. We specifically readdressed that there may be need to alter operative plans according to intraopertive findings including conversion to open procedure. I also rementioned that sometimes repositioning and additional ports (incisions) are needed for adequate bladder cuff removal. We rediscussed specific peri-operative risks including bleeding, infection, deep vein thrombosis, pulmonary embolism, compartment syndrome, nuropathy / neuropraxia, heart attack, stroke, death, as well as long-term risks such as non-cure / need for additional therapy and need for imaging and lab based post-op surveillance protocols. We rediscussed typical hospital course of approximately 2 day hospitalization, need for peri-operative drains / catheters (foley for approx 10 days with bladder cuff excision), and typical post-hospital course with return to most non-strenuous activities by 2 weeks and ability to return to most jobs and more strenuous activity such as exercise by 6 weeks.     Bora Bost 05/27/2014, 6:18 AM

## 2014-05-27 NOTE — Anesthesia Postprocedure Evaluation (Signed)
Anesthesia Post Note  Patient: Timothy Hickman  Procedure(s) Performed: Procedure(s) (LRB): ROBOT ASSITED LAPAROSCOPIC NEPHROURETERECTOMY (Right)  Anesthesia type: General  Patient location: PACU  Post pain: Pain level controlled  Post assessment: Post-op Vital signs reviewed  Last Vitals: BP 187/88 mmHg  Pulse 88  Temp(Src) 36.7 C (Oral)  Resp 16  SpO2 98%  Post vital signs: Reviewed  Level of consciousness: sedated  Complications: No apparent anesthesia complications

## 2014-05-27 NOTE — Brief Op Note (Signed)
05/27/2014  5:30 PM  PATIENT:  Timothy Hickman  44 y.o. male  PRE-OPERATIVE DIAGNOSIS:  RIGHT RENAL PELVIS CANCER  POST-OPERATIVE DIAGNOSIS:  RIGHT RENAL PELVIS CANCER  PROCEDURE:  Procedure(s): ROBOT ASSITED LAPAROSCOPIC NEPHROURETERECTOMY (Right)  SURGEON:  Surgeon(s) and Role:    * [name], MD - Primary  PHYSICIAN ASSISTANT:   ASSISTANTS: 1 - Clemetine Marker PA, 2 - Charlton Haws MD   ANESTHESIA:   general  EBL:  Total I/O In: 2000 [I.V.:2000] Out: 250 [Urine:150; Blood:100]  BLOOD ADMINISTERED:none  DRAINS: 1 - JP to bulb 2- Foley to straight drain   LOCAL MEDICATIONS USED:  MARCAINE     SPECIMEN:  Source of Specimen:  Right nephroureterectomy with bladder cuff, paracaval lymph nodes  DISPOSITION OF SPECIMEN:  PATHOLOGY  COUNTS:  YES  TOURNIQUET:  * No tourniquets in log *  DICTATION: .Other Dictation: Dictation Number [id number]  PLAN OF CARE: Admit to inpatient   PATIENT DISPOSITION:  PACU - hemodynamically stable.   Delay start of Pharmacological VTE agent (>24hrs) due to surgical blood loss or risk of bleeding: yes

## 2014-05-27 NOTE — Anesthesia Procedure Notes (Addendum)
Procedure Name: Intubation Date/Time: 05/27/2014 1:10 PM Performed by: [name] Pre-anesthesia Checklist: Patient identified, Emergency Drugs available, Suction available and Patient being monitored Patient Re-evaluated:Patient Re-evaluated prior to inductionOxygen Delivery Method: Circle system utilized Preoxygenation: Pre-oxygenation with 100% oxygen Intubation Type: IV induction Ventilation: Mask ventilation without difficulty and Oral airway inserted - appropriate to patient size Laryngoscope Size: Mac and 4 Grade View: Grade I Tube type: Oral Number of attempts: 2 Placement Confirmation: ETT inserted through vocal cords under direct vision,  positive ETCO2 and breath sounds checked- equal and bilateral Secured at: 23 cm Tube secured with: Tape Comments: Smooth IV induction, easy mask with OAW, DVL x 1 with mac 4, grade 1 view, ett inserted through cords, Cuff inflated, - etco2, - breath sounds. Cuff deflated, ETT removed, patient masked, O2 sats remain 100%, DVL with mac 4, grade 1 view, ETT inserted through cords, cuff inflated, + etco2, + bil breath sounds.

## 2014-05-27 NOTE — Transfer of Care (Signed)
Immediate Anesthesia Transfer of Care Note  Patient: Timothy Hickman  Procedure(s) Performed: Procedure(s): ROBOT ASSITED LAPAROSCOPIC NEPHROURETERECTOMY (Right)  Patient Location: PACU  Anesthesia Type:General  Level of Consciousness: awake, sedated and patient cooperative  Airway & Oxygen Therapy: Patient Spontanous Breathing and Patient connected to face mask oxygen  Post-op Assessment: Report given to RN and Post -op Vital signs reviewed and stable  Post vital signs: Reviewed and stable  Last Vitals:  Filed Vitals:   05/27/14 0959  BP: 152/88  Pulse: 70  Temp: 36.4 C  Resp: 16    Complications: No apparent anesthesia complications

## 2014-05-27 NOTE — Discharge Instructions (Signed)

## 2014-05-28 LAB — BASIC METABOLIC PANEL
Anion gap: 9 (ref 5–15)
BUN: 11 mg/dL (ref 6–23)
CO2: 27 mmol/L (ref 19–32)
Calcium: 8.6 mg/dL (ref 8.4–10.5)
Chloride: 102 mmol/L (ref 96–112)
Creatinine, Ser: 0.96 mg/dL (ref 0.50–1.35)
GFR calc Af Amer: >90 mL/min (ref >90)
GFR calc non Af Amer: >90 mL/min (ref >90)
Glucose, Bld: 164 mg/dL — ABNORMAL HIGH (ref 70–99)
Potassium: 4.1 mmol/L (ref 3.5–5.1)
SODIUM: 138 mmol/L (ref 135–145)

## 2014-05-28 LAB — HEMOGLOBIN AND HEMATOCRIT, BLOOD
HEMATOCRIT: 41 % (ref 39.0–52.0)
Hemoglobin: 13.4 g/dL (ref 13.0–17.0)

## 2014-05-28 MED ORDER — SENNA 8.6 MG PO TABS
1.0000 | ORAL_TABLET | Freq: Every day | ORAL | Status: DC
Start: 2014-05-28 — End: 2014-05-29
  Administered 2014-05-28: 8.6 mg via ORAL
  Filled 2014-05-28: qty 1

## 2014-05-28 MED ORDER — OXYCODONE HCL 5 MG PO TABS
10.0000 mg | ORAL_TABLET | ORAL | Status: DC | PRN
  Administered 2014-05-28 – 2014-05-29 (×6): 10 mg via ORAL
  Filled 2014-05-28 (×6): qty 2

## 2014-05-28 MED ORDER — DOCUSATE SODIUM 100 MG PO CAPS
100.0000 mg | ORAL_CAPSULE | Freq: Two times a day (BID) | ORAL | Status: DC
Start: 2014-05-28 — End: 2014-05-29
  Administered 2014-05-28 – 2014-05-29 (×3): 100 mg via ORAL
  Filled 2014-05-28 (×4): qty 1

## 2014-05-28 MED ORDER — OXYCODONE-ACETAMINOPHEN 5-325 MG PO TABS: 1.0000 | ORAL_TABLET | ORAL | Status: DC | PRN

## 2014-05-28 NOTE — Progress Notes (Signed)
1 Day Post-Op Subjective: Overnight with pain, moderately controlled with 5mg  Oxy. Ambulated. Tolerating clears without nausea. No flatus yet.  Objective: Vital signs in last 24 hours: Temp:  [97.6 F (36.4 C)-98.6 F (37 C)] 98.5 F (36.9 C) (02/13 0514) Pulse Rate:  [70-108] 80 (02/13 0514) Resp:  [14-20] 16 (02/13 0514) BP: (123-187)/(74-99) 133/81 mmHg (02/13 0514) SpO2:  [95 %-100 %] 97 % (02/13 0514) Weight:  [94.8 kg (208 lb 15.9 oz)] 94.8 kg (208 lb 15.9 oz) (02/12 2029)  Intake/Output from previous day: 02/12 0701 - 02/13 0700 In: 3787.5 [I.V.:3787.5] Out: 1085 [Urine:975; Drains:10; Blood:100] Intake/Output this shift:    Physical Exam:  General: Alert and oriented CV: RRR Lungs: Clear Abdomen: Soft, ND. Appropriately tender. Incisions: CDI JP serosang and minimal. Foley clear. Ext: NT, No erythema  Lab Results:  Recent Labs  05/27/14 1724 05/28/14 0536  HGB 14.0 13.4  HCT 41.8 41.0   BMET  Recent Labs  05/28/14 0536  NA 138  K 4.1  CL 102  CO2 27  GLUCOSE 164*  BUN 11  CREATININE 0.96  CALCIUM 8.6     Studies/Results: No results found.  Assessment/Plan: POD#1 from R robotic nephU. Expected postop course.  Will increase oxycodone to 10mg . Hold toradol given unclear creatinine plateau. Bowel regimen. Reg diet. MLIVF. D/c flomax. Anticipate home tomorrow .   LOS: 1 day   Margo Aye 05/28/2014, 8:55 AM   I have seen Timothy Hickman, and performed examination. I agree with the above note. We will increase his  oxycodone.

## 2014-05-28 NOTE — Op Note (Signed)
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  PROCEDURE: 1. Robotic-assisted laparoscopic right nephroureterectomy, bladder     cuff excision. 2. Retroperitoneal lymph node dissection, robotic.  ASSISTANT: 1. Clemetine Marker, PA. 2. Charlton Haws, M.D.   ESTIMATED BLOOD LOSS:  100 mL.  COMPLICATIONS:  None.  SPECIMENS: 1. Right nephroureterectomy with bladder cuff. 2. Right paracaval lymph nodes.  DRAINS: 1. Foley catheter straight drain. 2. Jackson-Pratt drain bulb suction.  INDICATIONS:  Mr. 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  He also had some borderline paracaval lymphadenopathy and no obvious metastatic disease.  In the course prior to the surgery he devloped refractory colic symptoms this required a right ureteral stent placement for decompression.  He referred for consideration definitive therapy with nephroureterectomy .  Options were discussed including this versus palliative only apporaches versus ablative therapies  Versus surgery with and without minimally invasive assistance.  He wished to proceed with right robotic nephroureterectomy with retroperitoneal lymph node dissection.  PROCEDURE IN DETAIL:  The patient being Aquan Kope was verified. Procedure being right nephroureterectomy was confirmed.  Procedure was carried out.  Time-out was performed.  Intravenous antibiotics were administered.  General endotracheal anesthesia was introduced.  The patient was placed into a right-sided full flank  position employing 15 degrees of table flexion, superior arm elevator, and sequential compression devices, bottom leg was bent and top leg was straight.  Bean bag was also used as the axillary roll.  He was further fashioned in table using 3-inch tape over foam padding across his shoulders, hips and mid thigh and found to be suitably positioned.  A sterile field was created by prepping and draping the patient's infra-xiphoid, and abdomen using chlorhexidine gluconate and after Foley catheter was placed per urethra to straight drain.  Next, a high-flow low pressure pneumoperitoneum was obtained using Veress technique, the right lower quadrant having passed the aspiration and drop test.  An 8 mm robotic camera port was then placed approximately 1 handbreadth superior lateral to the umbilicus on the right side.  Laparoscopic examination of the peritoneal cavity revealed no significant adhesions, no visceral injury. Next, a 5 mm subxiphoid port was placed and a rigid self-locking grasper was used to identify the superior traction on the liver which moved the inferior liver border off the anterior surface of the kidney.  Also, an 8 mm subxiphoid robotic port was placed purposely approximately 4 cm lateral to the camera port and 8 mm robotic port site approximately 4 fingerbreadths superior medial to the anterior iliac spine.  An 8 mm robotic port in paramedian location inferiorly approximately 1 handbreadth above the pubic symphysis.  A 12-lead assistant port in the midline just below the umbilicus and  a 12-lead assistant port in the midline approximately 1 handbreadth superior to the umbilicus.  Robot was docked and passed through electronic checks.  Initial attention was directed to the left retroperitoneum.  Incision was made lateral to the ascending colon from the area of the cecum toward the area of the hepatic flexure and the cecum was carefully mobilized medially.  The duodenum was  encountered and also carefully Kocherized medially exposing the anterior surface of the inferior vena cava and immediately became clear that the tissue overlying the inferior aspect of the kidney toward the area of the ureter and iliac vessels was incredibly inflamed consistent with possible inflammatory reaction from recent stent versus possibility of locally advanced disease; however, plans appeared to be amenable for resection.  Lower pole of the kidney was identified and placed on gentle lateral traction, dissection was proceeded to medial to this.  The ureter was encountered and also placed on gentle lateral traction.  Dissection was proceeded superiorly toward the renal hilum. Renal hilum.  Each of two arteries was controlled using a large laparoscopic clip proximally followed by vascular stapler distally and the vein was controlled using vascular stapler line.  The plane just lateral to the inferior vena cava was further developed superiorly thus including the adrenal with the specimen purposely and superior attachments were taken down.  As expected, there was significant inflammatory response and some lymphadenopathy around the paracaval area and interoaortocaval area for lymph node sampling of this.  As such formal lymphadenectomy performed of the right paracaval as well some retrocaval lymph nodes from the area increased the diaphragm to an area approximately 2 cm below the renal hilum.  This fiber fatty tissue was set aside for pathology, labeled right paracaval lymph nodes.  The ureter was then dissected inferiorly toward the area of the iliac vessels and as aforementioned, this area was very, very dense and desmoplastic.  A very careful dissection was performed releasing the ureter circumferentially from overlying tissue.  This was carefully swept superiorly off the iliac crossing.  The peritoneal flap was then developed just lateral to the medial umbilical ligament on  the right side.  Following this, dissection proceeded towards the area of the internal ring and And the previou peritoneal incision was extended along the lateral border of the medial umbilical ligament resulting in peritoneal flap which was then held medially to further exposure to the retroperitoneum of the ureter toward the bladder.  Continued circumferential mobilization was performed of the ureter and toward the area of the ureterovesical junction.  The vas was encountered and swept laterally and not ligated.  The superior vesical artery was also noted and gently swept laterally and not ligated.  The ureter was placed on gentle superior traction and the detrusor fibers were encountered indicating very close proximity to the bladder mucosa. A 3-0 V-Loc stay stitch was applied in the anterior lateral position and held in that direction and formal bladder cuff excision was performed circumferentially keeping an approximately nickel-sized area of the bladder cuff.  The previous stent was completely kept with the specimen and notably an extra large hemoclip was placed in the ureter before entry into the bladder.  The previously placed anchoring stitch was then used to close the small cystotomy in a running fashion, which resulted in excellent mucosal and muscular apposition.  All instruments, sponge, and needle counts were correct.  Nephroureteral resection was completely free.  There was placement of a large EndoCatch bag for later retrieval.  A Carter-Thomason suture  passer was used to close the inferior most assitant port site at the level of fascia under laparoscopic vision.  Specimen was retrieved by extending the previous superior most assistant port site in the midline just approximately 6 cm and removing the nephroureterectomy specimen, which was set aside for permanent pathology.  The extraction site was closed with fascia using figure-of- eight PDS x4.  All incision sites were  infiltrated with dilute lyophilized Marcaine notably just prior to assessment, extraction, and closed suction drain through the previous  right lateral most robotic port site near the peritoneal cavity and drain stitch was applied.  At the extraction site, Scarpa's was reapproximated using running Vicryl and all skin incisions were reapproximated with subcuticular Monocryl followed by Dermabond.  Procedure was terminated.  The patient tolerated the procedure well.  There were no immediate periprocedural complications.  The patient was taken to the postanesthesia care unit in stable condition.          ______________________________ [name], MD     TM/MEDQ  D:  05/27/2014  T:  05/28/2014  Job:  [id number]

## 2014-05-29 LAB — BASIC METABOLIC PANEL
Anion gap: 5 (ref 5–15)
BUN: 12 mg/dL (ref 6–23)
CALCIUM: 8.1 mg/dL — AB (ref 8.4–10.5)
CO2: 26 mmol/L (ref 19–32)
CREATININE: 1.07 mg/dL (ref 0.50–1.35)
Chloride: 106 mmol/L (ref 96–112)
GFR calc Af Amer: >90 mL/min (ref >90)
GFR calc non Af Amer: 82 mL/min — ABNORMAL LOW (ref >90)
Glucose, Bld: 108 mg/dL — ABNORMAL HIGH (ref 70–99)
Potassium: 3.6 mmol/L (ref 3.5–5.1)
SODIUM: 137 mmol/L (ref 135–145)

## 2014-05-29 LAB — HEMOGLOBIN AND HEMATOCRIT, BLOOD
HEMATOCRIT: 38.6 % — AB (ref 39.0–52.0)
Hemoglobin: 12.2 g/dL — ABNORMAL LOW (ref 13.0–17.0)

## 2014-05-29 MED ORDER — SENNA 8.6 MG PO TABS: 1.0000 | ORAL_TABLET | Freq: Every day | ORAL | Status: DC

## 2014-05-29 MED ORDER — DOCUSATE SODIUM 100 MG PO CAPS: 100.0000 mg | ORAL_CAPSULE | Freq: Two times a day (BID) | ORAL | Status: DC

## 2014-05-29 NOTE — Discharge Summary (Signed)
  Date of admission: 05/27/2014  Date of discharge: 05/29/2014  Admission diagnosis: upper tract urothelial carcinoma  Discharge diagnosis: same  Secondary diagnoses:   History and Physical: For full details, please see admission history and physical. Briefly, Timothy Hickman is a 44 y.o. year old patient with high grade UTUC.   Hospital Course: He underwent elective robotic right nephroureterectomy on 2/12. The surgery was uncomplicated. By POD#2, his pain was controlled with PO pain meds, and he was passing flatus. He was tolerating reg diet. His JP output was minimal and was d/c'd prior to discharge. He was discharged home with catheter in place with instructions to return for cystogram and removal.  Laboratory values:  Recent Labs  05/27/14 1724 05/28/14 0536 05/29/14 0430  HGB 14.0 13.4 12.2*  HCT 41.8 41.0 38.6*    Recent Labs  05/28/14 0536 05/29/14 0430  CREATININE 0.96 1.07    Disposition: Home  Discharge instruction: The patient was instructed to be ambulatory but told to refrain from heavy lifting, strenuous activity, or driving.  Discharge medications:    Medication List    STOP taking these medications        ciprofloxacin 500 MG tablet  Commonly known as:  CIPRO     traMADol 50 MG tablet  Commonly known as:  ULTRAM      TAKE these medications        docusate sodium 100 MG capsule  Commonly known as:  COLACE  Take 1 capsule (100 mg total) by mouth 2 (two) times daily.     oxyCODONE-acetaminophen 5-325 MG per tablet  Commonly known as:  PERCOCET/ROXICET  Take 1-2 tablets by mouth every 6 (six) hours as needed for moderate pain.     oxyCODONE-acetaminophen 5-325 MG per tablet  Commonly known as:  ROXICET  Take 1-2 tablets by mouth every 4 (four) hours as needed for severe pain.     phenazopyridine 200 MG tablet  Commonly known as:  PYRIDIUM  Take 1 tablet (200 mg total) by mouth 3 (three) times daily as needed for pain.     senna 8.6 MG Tabs  tablet  Commonly known as:  SENOKOT  Take 1 tablet (8.6 mg total) by mouth daily.     tamsulosin 0.4 MG Caps capsule  Commonly known as:  FLOMAX  Take 0.4 mg by mouth daily.        Followup:      Follow-up Information    Follow up with Timothy Frock, MD On 06/17/2014.   Specialty:  Urology   Why:  at 2:00 for bladder X-Ray, MD visit, and catheter removal   Contact information:   [address] [place] [address] [phone]

## 2014-05-29 NOTE — Progress Notes (Signed)
Pt still having issues with pain rating it at a 9/10 at the beginning of shift.  Able to get pain more under control with a combination of PO and IV pain meds.  VSS, is passing flatus, tolerating regular diet, has walked halls x4 today. abd is soft c +bs, JP output 20 cc's serous/sanguinous drainage at 2200, foley output QS.  Have discussed PO pain meds and discharge and will continue to wean pt from iv pain meds is possible.

## 2014-05-31 ENCOUNTER — Encounter (HOSPITAL_COMMUNITY): Payer: Self-pay | Admitting: Urology

## 2014-12-21 ENCOUNTER — Encounter (HOSPITAL_COMMUNITY): Payer: Self-pay | Admitting: *Deleted

## 2014-12-21 ENCOUNTER — Emergency Department (HOSPITAL_COMMUNITY)
Admission: EM | Admit: 2014-12-21 | Discharge: 2014-12-21 | Disposition: A | Discharge status: Home / Self Care | Payer: No Typology Code available for payment source | Attending: Emergency Medicine | Admitting: Emergency Medicine

## 2014-12-21 ENCOUNTER — Emergency Department (HOSPITAL_COMMUNITY): Payer: No Typology Code available for payment source

## 2014-12-21 DIAGNOSIS — C651 Malignant neoplasm of right renal pelvis: Secondary | ICD-10-CM

## 2014-12-21 DIAGNOSIS — Z960 Presence of urogenital implants: Secondary | ICD-10-CM | POA: Insufficient documentation

## 2014-12-21 DIAGNOSIS — Z85528 Personal history of other malignant neoplasm of kidney: Secondary | ICD-10-CM | POA: Insufficient documentation

## 2014-12-21 DIAGNOSIS — Z87828 Personal history of other (healed) physical injury and trauma: Secondary | ICD-10-CM | POA: Insufficient documentation

## 2014-12-21 DIAGNOSIS — Z8709 Personal history of other diseases of the respiratory system: Secondary | ICD-10-CM | POA: Insufficient documentation

## 2014-12-21 DIAGNOSIS — R011 Cardiac murmur, unspecified: Secondary | ICD-10-CM | POA: Insufficient documentation

## 2014-12-21 DIAGNOSIS — I129 Hypertensive chronic kidney disease with stage 1 through stage 4 chronic kidney disease, or unspecified chronic kidney disease: Secondary | ICD-10-CM | POA: Insufficient documentation

## 2014-12-21 DIAGNOSIS — N189 Chronic kidney disease, unspecified: Secondary | ICD-10-CM | POA: Insufficient documentation

## 2014-12-21 DIAGNOSIS — Z720 Tobacco use: Secondary | ICD-10-CM | POA: Insufficient documentation

## 2014-12-21 LAB — URINE MICROSCOPIC-ADD ON

## 2014-12-21 LAB — CBC
HCT: 46.1 % (ref 39.0–52.0)
HEMOGLOBIN: 15.9 g/dL (ref 13.0–17.0)
MCH: 33.3 pg (ref 26.0–34.0)
MCHC: 34.5 g/dL (ref 30.0–36.0)
MCV: 96.6 fL (ref 78.0–100.0)
Platelets: 231 10*3/uL (ref 150–400)
RBC: 4.77 MIL/uL (ref 4.22–5.81)
RDW: 13.3 % (ref 11.5–15.5)
WBC: 10.2 10*3/uL (ref 4.0–10.5)

## 2014-12-21 LAB — BASIC METABOLIC PANEL
ANION GAP: 9 (ref 5–15)
BUN: 16 mg/dL (ref 6–20)
CALCIUM: 9.2 mg/dL (ref 8.9–10.3)
CO2: 24 mmol/L (ref 22–32)
Chloride: 106 mmol/L (ref 101–111)
Creatinine, Ser: 0.97 mg/dL (ref 0.61–1.24)
GFR calc Af Amer: >60 mL/min (ref >60)
GFR calc non Af Amer: >60 mL/min (ref >60)
GLUCOSE: 92 mg/dL (ref 65–99)
Potassium: 4 mmol/L (ref 3.5–5.1)
Sodium: 139 mmol/L (ref 135–145)

## 2014-12-21 LAB — URINALYSIS, ROUTINE W REFLEX MICROSCOPIC
Glucose, UA: NEGATIVE mg/dL
KETONES UR: NEGATIVE mg/dL
Nitrite: NEGATIVE
PH: 6 (ref 5.0–8.0)
Protein, ur: 100 mg/dL — AB
Specific Gravity, Urine: 1.023 (ref 1.005–1.030)
Urobilinogen, UA: 1 mg/dL (ref 0.0–1.0)

## 2014-12-21 MED ORDER — IOHEXOL 300 MG/ML  SOLN
100.0000 mL | Freq: Once | INTRAMUSCULAR | Status: AC | PRN
  Administered 2014-12-21: 100 mL via INTRAVENOUS

## 2014-12-21 MED ORDER — SODIUM CHLORIDE 0.9 % IV BOLUS (SEPSIS)
1000.0000 mL | Freq: Once | INTRAVENOUS | Status: AC
  Administered 2014-12-21: 1000 mL via INTRAVENOUS

## 2014-12-21 NOTE — Progress Notes (Addendum)
CM spoke with pt who confirms uninsured [employer] resident with no pcp.  CM discussed and provided written information for uninsured accepting pcps, discussed the importance of pcp vs EDP services for f/u care, www.needymeds.org, www.goodrx.com, discounted pharmacies and other [employer] such as [employer], affordable care act, financial assistance, uninsured dental services, [place] med assist, DSS and  health department  Reviewed resources for [employer] uninsured accepting pcps like [facility], family medicine at [employer], [facility], [facility], local urgent care centers, [facility], [facility] family practice, general medical clinics, family services of the [place], [facility] urgent care plus others, medication resources, [facility] out patient pharmacies and housing Pt voiced understanding and appreciation of resources provided   Provided [facility] contact information Pt did not agreed to a referral He states his insurance re enrollment is coming up and he is choosing [employer] vs [employer] which he dropped after being "overcharged"   [facility] ED CM noted pt with coverage but no pcp listed [facility] ED CM spoke with pt on how to obtain an in network pcp with insurance coverage via the customer service number or web site  Cm reviewed ED level of care for crisis/emergent services and community pcp level of care to manage continuous or chronic medical concerns.  The pt voiced understanding CM encouraged pt and discussed pt's responsibility to verify with pt's insurance carrier that any recommended medical provider offered by any emergency room or a hospital provider is within the carrier's network. The pt voiced understanding  Wife at bedside has been previously seen by ED Cm when attempting to assist their daughter Continues to attempt to get medicaid until her job can offer her insurance
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
## 2014-12-21 NOTE — ED Notes (Signed)
Pt returned from CT °

## 2014-12-21 NOTE — ED Notes (Signed)
Bed: WA10 Expected date:  Expected time:  Means of arrival:  Comments: Triage 3  

## 2014-12-21 NOTE — Discharge Instructions (Signed)
CT scan shows recurrence of the cancer in your bladder. You will need to call urology office for follow-up appointment

## 2014-12-22 NOTE — ED Provider Notes (Signed)
CSN: [id number]     Arrival date & time 12/21/14  1042 History   First MD Initiated Contact with Patient 12/21/14 1207     Chief Complaint  Patient presents with  . Hematuria  . hx of renal cancer      (Consider location/radiation/quality/duration/timing/severity/associated sxs/prior Treatment) HPI..... Status post diagnosis of cancer of the right renal pelvis with surgery on 05/27/2014 by Dr. Tresa Moore for same.   Patient has been doing well until today when he started having hematuria in the past 12 hours. No fever or chills. Normal appetite. He is able to urinate.  Past Medical History  Diagnosis Date  . Right renal mass   . Borderline hypertension   . Bronchitis   . Hematuria   . 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 Chronic kidney disease   . Cancer   . Fracture of finger of right hand     hx of    Past Surgical History  Procedure Laterality Date  . Cystoscopy with ureteroscopy Right 05/05/2014    Procedure: CYSTOSCOPY/ RIGHT URETEROSCOPY/ RIGHT RETROGRADE PYLEGRAM WITH INTERPRETATION/ RIGHT URETERAL BALLOON DILATION OF UPJ OBSTRUCTION/ BIOPSY OF RIGHT RENAL PELVIC MASS;  Surgeon: Ailene Rud, MD;  [facility];  Service: Urology;  Laterality: Right;  . Cystoscopy w/ ureteral stent placement Right 05/07/2014    Procedure: CYSTOSCOPY WITH RETROGRADE PYELOGRAM/RIGHT DIAGNOSTIC URETEROSCOPY, RIGHT URETERAL STENT PLACEMENT;  Surgeon: Raynelle Bring, MD;  Location: [facility];  Service: Urology;  Laterality: Right;  . Robot assited laparoscopic nephroureterectomy Right 05/27/2014    Procedure: ROBOT ASSITED LAPAROSCOPIC NEPHROURETERECTOMY;  Surgeon: [name], MD;  Location: [facility];  Service: Urology;  Laterality: Right;   Family History  Problem Relation Age of Onset  . Breast cancer Mother   . HIV/AIDS Father    Social History  Substance Use Topics  . Smoking status: Current Every Day Smoker -- 0.50 packs/day  for 20 years    Types: Cigarettes  . Smokeless tobacco: Never Used  . Alcohol Use: 0.0 oz/week     Comment: rare    Review of Systems  All other systems reviewed and are negative.     Allergies  Valium  Home Medications   Prior to Admission medications   Medication Sig Start Date End Date Taking? Authorizing Provider  docusate sodium (COLACE) 100 MG capsule Take 1 capsule (100 mg total) by mouth 2 (two) times daily. Patient not taking: Reported on 12/21/2014 05/29/14   Charlton Haws, MD  phenazopyridine (PYRIDIUM) 200 MG tablet Take 1 tablet (200 mg total) by mouth 3 (three) times daily as needed for pain. Patient not taking: Reported on 05/06/2014 05/05/14   Carolan Clines, MD  senna (SENOKOT) 8.6 MG TABS tablet Take 1 tablet (8.6 mg total) by mouth daily. Patient not taking: Reported on 12/21/2014 05/29/14   Charlton Haws, MD   BP 147/101 mmHg  Pulse 70  Temp(Src) 98.9 F (37.2 C) (Oral)  Resp 18  SpO2 99% Physical Exam  Constitutional: He is oriented to person, place, and time. He appears well-developed and well-nourished.  HENT:  Head: Normocephalic and atraumatic.  Eyes: Conjunctivae and EOM are normal. Pupils are equal, round, and reactive to light.  Neck: Normal range of motion. Neck supple.  Cardiovascular: Normal rate and regular rhythm.   Pulmonary/Chest: Effort normal and breath sounds normal.  Abdominal: Soft. Bowel sounds are normal.  Genitourinary:  Minimal right flank tenderness  Musculoskeletal: Normal range of motion.  Neurological: He is alert and oriented to person, place, and time.  Skin: Skin is warm and dry.  Psychiatric: He has a normal mood and affect. His behavior is normal.  Nursing note and vitals reviewed.   ED Course  Procedures (including critical care time) Labs Review Labs Reviewed  URINALYSIS, ROUTINE W REFLEX MICROSCOPIC (NOT AT [facility]) - Abnormal; Notable for the following:    Color, Urine RED (*)    APPearance TURBID (*)    Hgb  urine dipstick LARGE (*)    Bilirubin Urine SMALL (*)    Protein, ur 100 (*)    Leukocytes, UA SMALL (*)    All other components within normal limits  BASIC METABOLIC PANEL  CBC  URINE MICROSCOPIC-ADD ON    Imaging Review Ct Abdomen Pelvis W Contrast  12/21/2014   CLINICAL DATA:  History of right renal cancer status post removal of right kidney and ureter as well as partial removal of bladder in February 2016. Patient complains of hematuria this morning.  EXAM: CT ABDOMEN AND PELVIS WITH CONTRAST  TECHNIQUE: Multidetector CT imaging of the abdomen and pelvis was performed using the standard protocol following bolus administration of intravenous contrast.  CONTRAST:  177mL OMNIPAQUE IOHEXOL 300 MG/ML  SOLN  COMPARISON:  May 06, 2014  FINDINGS: Patient status post right nephrectomy and removal of right adrenal gland without abnormal mass in the right renal fossa. The left kidney is normal. There is no left hydronephrosis.  There is diffuse low density of liver without vessel displacement. No focal liver lesion is identified. The spleen, pancreas, gallbladder, left adrenal gland are normal. The aorta demonstrates atherosclerosis without aneurysmal dilatation. There is no abdominal lymphadenopathy.  There is no small bowel obstruction or diverticulitis. The appendix is normal.  Images of the pelvis demonstrate several enhancing masses along the bladder wall, largest measures 2.5 x 2.9 cm. There is mild diffuse thick wall of right side bladder. Prostate calcifications are identified. There is no pelvic lymphadenopathy.  The lung bases are clear. Degenerative joint changes of the spine are identified.  IMPRESSION: Bladder masses consistent with recurrence of tumor.  No evidence of recurrence in the right renal fossa.  Fatty infiltration of liver.   Electronically Signed   By: Abelardo Diesel M.D.   On: 12/21/2014 14:45   I have personally reviewed and evaluated these images and lab results as part of my  medical decision-making.   EKG Interpretation None      MDM   Final diagnoses:  Cancer of renal pelvis, right   Patient is in no acute distress. CT scan reveals bladder masses consistent with recurrent tumor. Discussed with Dr. Tresa Moore.  He recommended outpatient follow-up. Discussed findings with patient and his wife.   Nat Christen, MD 12/22/14 [phone]

## 2015-03-14 ENCOUNTER — Ambulatory Visit (HOSPITAL_COMMUNITY)
Admission: RE | Admit: 2015-03-14 | Discharge: 2015-03-14 | Disposition: A | Discharge status: Home / Self Care | Payer: BLUE CROSS/BLUE SHIELD | Source: Ambulatory Visit | Attending: Urology | Admitting: Urology

## 2015-03-14 ENCOUNTER — Other Ambulatory Visit: Payer: Self-pay | Admitting: Urology

## 2015-03-14 DIAGNOSIS — C675 Malignant neoplasm of bladder neck: Secondary | ICD-10-CM

## 2015-03-15 ENCOUNTER — Other Ambulatory Visit: Payer: Self-pay | Admitting: Urology

## 2015-03-29 ENCOUNTER — Encounter (HOSPITAL_BASED_OUTPATIENT_CLINIC_OR_DEPARTMENT_OTHER): Payer: Self-pay | Admitting: *Deleted

## 2015-03-29 NOTE — Progress Notes (Signed)
NPO AFTER MN. ARRIVE AT 0600. NEEDS ISTAT. CURRENT EKG IN CHART AND EPIC.  

## 2015-03-30 ENCOUNTER — Encounter (HOSPITAL_BASED_OUTPATIENT_CLINIC_OR_DEPARTMENT_OTHER): Payer: Self-pay | Admitting: Anesthesiology

## 2015-03-30 NOTE — Anesthesia Preprocedure Evaluation (Signed)
Anesthesia Evaluation  Patient identified by MRN, date of birth, ID band Patient awake    Reviewed: Allergy & Precautions, NPO status , Patient's Chart, lab work & pertinent test results  Airway Mallampati: II  TM Distance: >3 FB Neck ROM: Full    Dental no notable dental hx.    Pulmonary Current Smoker,    Pulmonary exam normal breath sounds clear to auscultation       Cardiovascular hypertension, negative cardio ROS Normal cardiovascular exam Rhythm:Regular Rate:Normal     Neuro/Psych negative neurological ROS  negative psych ROS   GI/Hepatic negative GI ROS, Neg liver ROS,   Endo/Other  negative endocrine ROS  Renal/GU Renal disease  negative genitourinary   Musculoskeletal negative musculoskeletal ROS (+)   Abdominal   Peds negative pediatric ROS (+)  Hematology negative hematology ROS (+)   Anesthesia Other Findings   Reproductive/Obstetrics negative OB ROS                             Anesthesia Physical Anesthesia Plan  ASA: II  Anesthesia Plan: General   Post-op Pain Management:    Induction: Intravenous  Airway Management Planned: LMA  Additional Equipment:   Intra-op Plan:   Post-operative Plan: Extubation in OR  Informed Consent: I have reviewed the patients History and Physical, chart, labs and discussed the procedure including the risks, benefits and alternatives for the proposed anesthesia with the patient or authorized representative who has indicated his/her understanding and acceptance.   Dental advisory given  Plan Discussed with: CRNA  Anesthesia Plan Comments:         Anesthesia Quick Evaluation

## 2015-03-31 ENCOUNTER — Encounter (HOSPITAL_COMMUNITY)
Admission: RE | Disposition: A | Discharge status: Home / Self Care | Payer: Self-pay | Source: Ambulatory Visit | Attending: Urology

## 2015-03-31 ENCOUNTER — Encounter (HOSPITAL_BASED_OUTPATIENT_CLINIC_OR_DEPARTMENT_OTHER): Payer: Self-pay

## 2015-03-31 ENCOUNTER — Ambulatory Visit (HOSPITAL_BASED_OUTPATIENT_CLINIC_OR_DEPARTMENT_OTHER): Payer: BLUE CROSS/BLUE SHIELD | Admitting: Anesthesiology

## 2015-03-31 ENCOUNTER — Observation Stay (HOSPITAL_BASED_OUTPATIENT_CLINIC_OR_DEPARTMENT_OTHER)
Admission: RE | Admit: 2015-03-31 | Discharge: 2015-04-01 | Disposition: A | Discharge status: Home / Self Care | Payer: BLUE CROSS/BLUE SHIELD | Source: Ambulatory Visit | Attending: Urology | Admitting: Urology

## 2015-03-31 DIAGNOSIS — I129 Hypertensive chronic kidney disease with stage 1 through stage 4 chronic kidney disease, or unspecified chronic kidney disease: Secondary | ICD-10-CM | POA: Insufficient documentation

## 2015-03-31 DIAGNOSIS — Z8553 Personal history of malignant neoplasm of renal pelvis: Secondary | ICD-10-CM | POA: Insufficient documentation

## 2015-03-31 DIAGNOSIS — N362 Urethral caruncle: Secondary | ICD-10-CM | POA: Insufficient documentation

## 2015-03-31 DIAGNOSIS — N50811 Right testicular pain: Secondary | ICD-10-CM | POA: Insufficient documentation

## 2015-03-31 DIAGNOSIS — Z905 Acquired absence of kidney: Secondary | ICD-10-CM | POA: Insufficient documentation

## 2015-03-31 DIAGNOSIS — C675 Malignant neoplasm of bladder neck: Principal | ICD-10-CM | POA: Insufficient documentation

## 2015-03-31 DIAGNOSIS — C679 Malignant neoplasm of bladder, unspecified: Secondary | ICD-10-CM | POA: Diagnosis present

## 2015-03-31 DIAGNOSIS — N189 Chronic kidney disease, unspecified: Secondary | ICD-10-CM | POA: Insufficient documentation

## 2015-03-31 DIAGNOSIS — F17210 Nicotine dependence, cigarettes, uncomplicated: Secondary | ICD-10-CM | POA: Diagnosis not present

## 2015-03-31 HISTORY — DX: Personal history of other specified conditions: Z87.898

## 2015-03-31 HISTORY — DX: Personal history of malignant neoplasm of renal pelvis: Z85.53

## 2015-03-31 HISTORY — DX: Malignant neoplasm of bladder, unspecified: C67.9

## 2015-03-31 HISTORY — PX: TRANSURETHRAL RESECTION OF BLADDER TUMOR WITH GYRUS (TURBT-GYRUS): SHX6458

## 2015-03-31 HISTORY — DX: Hesitancy of micturition: R39.11

## 2015-03-31 LAB — POCT I-STAT, CHEM 8
BUN: 21 mg/dL — ABNORMAL HIGH (ref 6–20)
CHLORIDE: 104 mmol/L (ref 101–111)
Calcium, Ion: 1.22 mmol/L (ref 1.12–1.23)
Creatinine, Ser: 1 mg/dL (ref 0.61–1.24)
Glucose, Bld: 103 mg/dL — ABNORMAL HIGH (ref 65–99)
HEMATOCRIT: 47 % (ref 39.0–52.0)
Hemoglobin: 16 g/dL (ref 13.0–17.0)
POTASSIUM: 3.8 mmol/L (ref 3.5–5.1)
SODIUM: 143 mmol/L (ref 135–145)
TCO2: 26 mmol/L (ref 0–100)

## 2015-03-31 SURGERY — TRANSURETHRAL RESECTION OF BLADDER TUMOR WITH GYRUS (TURBT-GYRUS)
Anesthesia: General

## 2015-03-31 MED ORDER — FENTANYL CITRATE (PF) 100 MCG/2ML IJ SOLN
25.0000 ug | INTRAMUSCULAR | Status: DC | PRN
  Administered 2015-03-31 (×4): 25 ug via INTRAVENOUS
  Filled 2015-03-31: qty 1

## 2015-03-31 MED ORDER — PROPOFOL 10 MG/ML IV BOLUS
INTRAVENOUS | Status: AC
  Filled 2015-03-31: qty 20

## 2015-03-31 MED ORDER — STERILE WATER FOR IRRIGATION IR SOLN
Status: DC | PRN
  Administered 2015-03-31: 500 mL

## 2015-03-31 MED ORDER — FENTANYL CITRATE (PF) 100 MCG/2ML IJ SOLN
INTRAMUSCULAR | Status: DC | PRN
  Administered 2015-03-31 (×12): 25 ug via INTRAVENOUS

## 2015-03-31 MED ORDER — ONDANSETRON HCL 4 MG/2ML IJ SOLN
INTRAMUSCULAR | Status: AC
  Filled 2015-03-31: qty 2

## 2015-03-31 MED ORDER — PROMETHAZINE HCL 25 MG/ML IJ SOLN
6.2500 mg | INTRAMUSCULAR | Status: DC | PRN
  Filled 2015-03-31: qty 1

## 2015-03-31 MED ORDER — HYDROMORPHONE HCL 1 MG/ML IJ SOLN
0.5000 mg | INTRAMUSCULAR | Status: DC | PRN
  Administered 2015-03-31 – 2015-04-01 (×2): 1 mg via INTRAVENOUS
  Filled 2015-03-31 (×2): qty 1

## 2015-03-31 MED ORDER — ACETAMINOPHEN 500 MG PO TABS
1000.0000 mg | ORAL_TABLET | Freq: Four times a day (QID) | ORAL | Status: AC
  Administered 2015-03-31 – 2015-04-01 (×4): 1000 mg via ORAL
  Filled 2015-03-31 (×4): qty 2

## 2015-03-31 MED ORDER — DEXTROSE 5 % IV SOLN
INTRAVENOUS | Status: AC
  Filled 2015-03-31: qty 50

## 2015-03-31 MED ORDER — ZOLPIDEM TARTRATE 5 MG PO TABS
5.0000 mg | ORAL_TABLET | Freq: Every evening | ORAL | Status: DC | PRN
  Administered 2015-03-31: 5 mg via ORAL
  Filled 2015-03-31: qty 1

## 2015-03-31 MED ORDER — SODIUM CHLORIDE 0.9 % IR SOLN
Status: DC | PRN
  Administered 2015-03-31 (×9): 3000 mL via INTRAVESICAL

## 2015-03-31 MED ORDER — OXYCODONE-ACETAMINOPHEN 5-325 MG PO TABS: 1.0000 | ORAL_TABLET | Freq: Four times a day (QID) | ORAL | Status: DC | PRN

## 2015-03-31 MED ORDER — SULFAMETHOXAZOLE-TRIMETHOPRIM 800-160 MG PO TABS: 1.0000 | ORAL_TABLET | Freq: Two times a day (BID) | ORAL | Status: DC

## 2015-03-31 MED ORDER — OXYCODONE HCL 5 MG PO TABS: 5.0000 mg | ORAL_TABLET | ORAL | Status: DC | PRN

## 2015-03-31 MED ORDER — LIDOCAINE HCL (CARDIAC) 20 MG/ML IV SOLN
INTRAVENOUS | Status: DC | PRN
  Administered 2015-03-31: 100 mg via INTRAVENOUS

## 2015-03-31 MED ORDER — FENTANYL CITRATE (PF) 100 MCG/2ML IJ SOLN
INTRAMUSCULAR | Status: AC
  Filled 2015-03-31: qty 2

## 2015-03-31 MED ORDER — CEFTRIAXONE SODIUM 1 G IJ SOLR
INTRAMUSCULAR | Status: AC
  Filled 2015-03-31: qty 10

## 2015-03-31 MED ORDER — DEXAMETHASONE SODIUM PHOSPHATE 10 MG/ML IJ SOLN
INTRAMUSCULAR | Status: AC
  Filled 2015-03-31: qty 1

## 2015-03-31 MED ORDER — SENNOSIDES-DOCUSATE SODIUM 8.6-50 MG PO TABS
1.0000 | ORAL_TABLET | Freq: Two times a day (BID) | ORAL | Status: DC
  Administered 2015-03-31 – 2015-04-01 (×3): 1 via ORAL
  Filled 2015-03-31 (×3): qty 1

## 2015-03-31 MED ORDER — KETOROLAC TROMETHAMINE 30 MG/ML IJ SOLN
INTRAMUSCULAR | Status: AC
  Filled 2015-03-31: qty 1

## 2015-03-31 MED ORDER — DEXAMETHASONE SODIUM PHOSPHATE 10 MG/ML IJ SOLN
INTRAMUSCULAR | Status: DC | PRN
  Administered 2015-03-31: 10 mg via INTRAVENOUS

## 2015-03-31 MED ORDER — LIDOCAINE HCL (CARDIAC) 20 MG/ML IV SOLN
INTRAVENOUS | Status: AC
  Filled 2015-03-31: qty 5

## 2015-03-31 MED ORDER — KCL IN DEXTROSE-NACL 20-5-0.45 MEQ/L-%-% IV SOLN
INTRAVENOUS | Status: DC
  Administered 2015-03-31 – 2015-04-01 (×2): via INTRAVENOUS
  Filled 2015-03-31 (×3): qty 1000

## 2015-03-31 MED ORDER — LACTATED RINGERS IV SOLN
INTRAVENOUS | Status: DC
  Administered 2015-03-31 (×2): via INTRAVENOUS
  Filled 2015-03-31: qty 1000

## 2015-03-31 MED ORDER — ONDANSETRON HCL 4 MG/2ML IJ SOLN
INTRAMUSCULAR | Status: DC | PRN
  Administered 2015-03-31: 4 mg via INTRAVENOUS

## 2015-03-31 MED ORDER — DEXTROSE 5 % IV SOLN
1.0000 g | INTRAVENOUS | Status: AC
  Administered 2015-03-31: 1 g via INTRAVENOUS
  Filled 2015-03-31: qty 10

## 2015-03-31 MED ORDER — SENNOSIDES-DOCUSATE SODIUM 8.6-50 MG PO TABS: 1.0000 | ORAL_TABLET | Freq: Two times a day (BID) | ORAL | Status: DC

## 2015-03-31 MED ORDER — PROPOFOL 10 MG/ML IV BOLUS
INTRAVENOUS | Status: DC | PRN
  Administered 2015-03-31: 350 mg via INTRAVENOUS

## 2015-03-31 SURGICAL SUPPLY — 33 items
BAG URINE DRAINAGE (UROLOGICAL SUPPLIES) IMPLANT
BAG URINE LEG 19OZ MD ST LTX (BAG) IMPLANT
BAG URINE LEG 500ML (DRAIN) IMPLANT
BAG URO CATCHER STRL LF (MISCELLANEOUS) ×4 IMPLANT
BASKET ZERO TIP NITINOL 2.4FR (BASKET) IMPLANT
CATH FOLEY 2WAY SLVR  5CC 22FR (CATHETERS)
CATH FOLEY 2WAY SLVR 30CC 20FR (CATHETERS) IMPLANT
CATH FOLEY 2WAY SLVR 5CC 22FR (CATHETERS) IMPLANT
CATH HEMA 3WAY 30CC 22FR COUDE (CATHETERS) ×4 IMPLANT
CATH INTERMIT  6FR 70CM (CATHETERS) ×4 IMPLANT
CLOTH BEACON ORANGE TIMEOUT ST (SAFETY) ×4 IMPLANT
ELECT BIVAP BIPO 22/24 DONUT (ELECTROSURGICAL)
ELECT REM PT RETURN 9FT ADLT (ELECTROSURGICAL) ×4
ELECTRD BIVAP BIPO 22/24 DONUT (ELECTROSURGICAL) IMPLANT
ELECTRODE REM PT RTRN 9FT ADLT (ELECTROSURGICAL) ×2 IMPLANT
EVACUATOR MICROVAS BLADDER (UROLOGICAL SUPPLIES) IMPLANT
GLOVE BIO SURGEON STRL SZ7.5 (GLOVE) ×4 IMPLANT
GOWN STRL REUS W/ TWL LRG LVL3 (GOWN DISPOSABLE) ×2 IMPLANT
GOWN STRL REUS W/ TWL XL LVL3 (GOWN DISPOSABLE) ×2 IMPLANT
GOWN STRL REUS W/TWL LRG LVL3 (GOWN DISPOSABLE) ×2
GOWN STRL REUS W/TWL XL LVL3 (GOWN DISPOSABLE) ×2
GUIDEWIRE ANG ZIPWIRE 038X150 (WIRE) IMPLANT
GUIDEWIRE STR DUAL SENSOR (WIRE) ×4 IMPLANT
HOLDER FOLEY CATH W/STRAP (MISCELLANEOUS) ×4 IMPLANT
IV NS IRRIG 3000ML ARTHROMATIC (IV SOLUTION) ×36 IMPLANT
KIT ROOM TURNOVER WOR (KITS) ×4 IMPLANT
LOOP CUT BIPOLAR 24F LRG (ELECTROSURGICAL) ×4 IMPLANT
MANIFOLD NEPTUNE II (INSTRUMENTS) ×4 IMPLANT
PACK CYSTO (CUSTOM PROCEDURE TRAY) ×4 IMPLANT
SET ASPIRATION TUBING (TUBING) IMPLANT
SYRINGE IRR TOOMEY STRL 70CC (SYRINGE) ×4 IMPLANT
TUBE CONNECTING 12'X1/4 (SUCTIONS) ×1
TUBE CONNECTING 12X1/4 (SUCTIONS) ×3 IMPLANT

## 2015-03-31 NOTE — H&P (Signed)
Timothy Hickman is an 44 y.o. male.    Chief Complaint: Pre-op Transurethral Resection of Bladder Tumor  HPI:   1 - Right Renal Pelvis Cancer - s/p right robotic nephro-uretrectomy with bladder cuff excision and paracaval lymphadenectomy 05/27/14 for pTaN0Mx large volume urothelial carcinoma. Pre-op staging with several enlarged paracaval lymph nodes that fortunately were negative for cancer spread by surgical pathology.  2 - Solitary Left Kidney - s/p right nephroureterectomy as per above. Post-op Cr <1 2016.   3 - Rt Orchalgia / Scrotal Swelling - Pt with noticeable increase in size of right hemiscrotum following right nephro-ureterecotmy. Minimal bother, just anxious may be problematic. Exam w/o tenderness, hernia. Favor likely reactive hydrocele / early varicocele form alteraion of venous drainage with nephrectomy / gonadal vein ligation. UA unremarkable.   4 - Bladder Cancer - new large volume right bladder neck cancer by f/u cysto (overdue) 02/2015.   PMH unremarkable. NO CV disease. No blood thinners. No current PCP.  Today " Timothy Hickman " is seen to proceed with TURBT and left retrograde. NO interval fevers.   Past Medical History  Diagnosis Date  . Borderline hypertension   . Chronic kidney disease   . History of renal pelvis cancer urologist-  dr Tresa Moore    dx 01/ 2016--  High grade papillary urothelial carinoma upper ureter and renal pelvis --  s/p  right nephroureterectomy 05-27-2014  . Bladder cancer ([place])   . Urinary hesitancy   . History of cardiac murmur as a child     Past Surgical History  Procedure Laterality Date  . Cystoscopy with ureteroscopy Right 05/05/2014    Procedure: CYSTOSCOPY/ RIGHT URETEROSCOPY/ RIGHT RETROGRADE PYLEGRAM WITH INTERPRETATION/ RIGHT URETERAL BALLOON DILATION OF UPJ OBSTRUCTION/ BIOPSY OF RIGHT RENAL PELVIC MASS;  Surgeon: Ailene Rud, MD;  Location: [facility];  Service: Urology;  Laterality: Right;  . Cystoscopy w/ ureteral  stent placement Right 05/07/2014    Procedure: CYSTOSCOPY WITH RETROGRADE PYELOGRAM/RIGHT DIAGNOSTIC URETEROSCOPY, RIGHT URETERAL STENT PLACEMENT;  Surgeon: Raynelle Bring, MD;  Location: [facility];  Service: Urology;  Laterality: Right;  . Robot assited laparoscopic nephroureterectomy Right 05/27/2014    Procedure: ROBOT ASSITED LAPAROSCOPIC NEPHROURETERECTOMY;  Surgeon: [name], MD;  Location: [facility];  Service: Urology;  Laterality: Right;    Family History  Problem Relation Age of Onset  . Breast cancer Mother   . HIV/AIDS Father    Social History:  reports that he has been smoking Cigarettes.  He has a 10 pack-year smoking history. He has never used smokeless tobacco. He reports that he drinks alcohol. He reports that he does not use illicit drugs.  Allergies:  Allergies  Allergen Reactions  . Valium Swelling    Face and hands    No prescriptions prior to admission    No results found for this or any previous visit (from the past 48 hour(s)). No results found.  Review of Systems  Constitutional: Negative.   HENT: Negative.   Eyes: Negative.   Respiratory: Negative.   Cardiovascular: Negative.   Gastrointestinal: Negative.   Genitourinary: Positive for hematuria.  Musculoskeletal: Negative.   Skin: Negative.   Neurological: Negative.   Endo/Heme/Allergies: Negative.   Psychiatric/Behavioral: Negative.     Height 5\' 7"  (1.702 m), weight 90.719 kg (200 lb). Physical Exam  Constitutional: He appears well-developed.  HENT:  Head: Normocephalic.  Eyes: Pupils are equal, round, and reactive to light.  Neck: Normal range of motion.  Cardiovascular: Normal rate.   Respiratory: Effort normal.  GI: Soft.  Genitourinary: Penis normal.  Musculoskeletal: Normal range of motion.  Neurological: He is alert.  Skin: Skin is warm.  Psychiatric: He has a normal mood and affect. His behavior is normal. Judgment and thought content normal.     Assessment/Plan   1 - Right  Renal Pelvis Cancer - Reinforced post-op protocol with Q41mo CT,CXR,CMP, cysto x 2 years, then yearly.  His most recetn imaging shows no evidence of distant disease.   2 - Solitary Left Kidney - GFR remains excellent.   3 -  Rt Orchalgia -  explained likley venous changes following nephrectomy ipsiteral. Very low suspicions for infection, infarction, etc. Rec observaiton and pt agrrees as minimal bother.  4 - Bladder Cancer - new and large volume. Proceed with TURBT / Lt retrograde today as planned. At least 50% chance of need for foley given volume of disease. RIsks, benefits, alternatives discussed previously and reiterated again today.     Timothy Hickman 03/31/2015, 5:52 AM

## 2015-03-31 NOTE — Anesthesia Postprocedure Evaluation (Signed)
Anesthesia Post Note  Patient: Timothy Hickman  Procedure(s) Performed: Procedure(s) (LRB): TRANSURETHRAL RESECTION OF BLADDER TUMOR WITH GYRUS (TURBT-GYRUS) (N/A)  Patient location during evaluation: PACU Anesthesia Type: General Level of consciousness: awake and alert Pain management: pain level controlled Vital Signs Assessment: post-procedure vital signs reviewed and stable Respiratory status: spontaneous breathing, nonlabored ventilation, respiratory function stable and patient connected to nasal cannula oxygen Cardiovascular status: blood pressure returned to baseline and stable Postop Assessment: no signs of nausea or vomiting Anesthetic complications: no    Last Vitals:  Filed Vitals:   03/31/15 1000 03/31/15 1025  BP: 135/94 155/91  Pulse: 70 60  Temp:  36.4 C  Resp: 11 12    Last Pain:  Filed Vitals:   03/31/15 1035  PainSc: 0-No pain                 Danel Requena J

## 2015-03-31 NOTE — Op Note (Signed)
NAMENAIF, HOBGOOD NO.:  [id number]  MEDICAL RECORD NO.:  [id number]  LOCATION:  1430                         FACILITY:  [facility]  PHYSICIAN:  [name], MD     DATE OF BIRTH:  Sep 05, 1970  DATE OF PROCEDURE: 03/31/2015                               OPERATIVE REPORT  DIAGNOSES:  Massive-volume bladder cancer, history of prior right nephroureterectomy with variable compliance.  PROCEDURE:  Transurethral resection of bladder tumor, volume large.  ESTIMATED BLOOD LOSS:  100 mL.  COMPLICATIONS:  None.  SPECIMENS: 1. Bladder tumor. 2. Base of bladder tumor. 3. Prostatic urethra. 4. Bladder neck, all for permanent pathology.  FINDINGS: 1. Massive-volume multifocal bladder tumor, total volume approximately     8 cm2 including foci of posterior bladder wall, right hemi-trigone     and bladder neck. 2. Complete resolution of all papillary tumor following transurethral     resection of bladder tumor.  DRAINS:  A 22-French three-way Foley catheter to normal saline irrigation.  Two drops per second, efflux light pink.  INDICATIONS:  Timothy Hickman is a pleasant 44 year old gentleman with history of prior right renal transitional cell carcinoma, status post right nephroureterectomy last year.  He has been variably compliant with his followup.  He re-presented with large volume gross hematuria with clots and CT scan corroborated large volume of a bladder cancer as did the office cystoscopy, this is clinically localized.  Options were discussed for management including transurethral resection of bladder tumor for further diagnostic treatment indication, he wished to proceed.  Informed consent was obtained and placed in the medical record.  PROCEDURE IN DETAIL:  The patient being Timothy Hickman, was verified. Procedure being transurethral resection of bladder tumor was confirmed. Procedure was carried out.  Time-out was aforementioned.  Intravenous antibiotics were  administered.  General LMA anesthesia was introduced. The patient was placed into a low lithotomy position and sterile field was created by prepping and draping the patient's penis, perineum and proximal thighs using iodine x3.  Next, cystourethroscopy was performed using a 23-French rigid cystoscope, 30-degree offset lens.  Inspection of the anterior and posterior urethra revealed papillary tumor from the bladder neck descending towards the area of the prostatic urethra just above the area of verumontanum.  Inspection of the urinary bladder revealed massive-volume bladder tumor mostly emanating from the posterior wall and right trigone.  The left ureteral orifice was visible.  Left hemi-trigone unremarkable.  He has had a recent CT urogram, which revealed no left-sided filling defects; therefore, left retrograde pyelogram was not performed.  The cystoscope was exchanged for 26-French continuous flow resectoscope sheath and very careful resection was performed using bipolar current, normal saline irrigation of all papillary tumor taking it down what appeared to be the seromuscular fibers of the bladder.  This was performed at all three foci of tumor.  The bladder tumor fragments were irrigated and set aside for permanent pathology.  The base of these were fulgurated.  There was no evidence of perforation.  Separate cold cup biopsies were taken of the base of the bladder tumor x4 and prostatic urethra just distal to the verumontanum and separate loops wipes were taken to the area  bladder neck and prostate junction.  All these were set aside for separate permanent pathology.  The base of these biopsy areas were then fulgurated.  Following these maneuvers, hemostasis appeared excellent. There was no evidence of bladder perforation.  Given the volume of tumor resected, it was felt that interval observation with Foley catheter and bladder irrigation would be warranted.  As such, a new 22-French  three- way Foley catheter was placed per urethra to straight drain.  A 20 mL sterile water in the balloon, this was mixed with normal saline irrigation, efflux light pink at two drops per second and procedure was terminated.  The patient tolerated the procedure well.  There were no immediate periprocedural complications.  The patient was taken to the postanesthesia care unit in stable condition.          ______________________________ [name], MD     TM/MEDQ  D:  03/31/2015  T:  03/31/2015  Job:  [id number]

## 2015-03-31 NOTE — Brief Op Note (Signed)
03/31/2015  8:51 AM  PATIENT:  Timothy Hickman  44 y.o. male  PRE-OPERATIVE DIAGNOSIS:  BLADDER CANCER  POST-OPERATIVE DIAGNOSIS:  BLADDER CANCER  PROCEDURE:  Transuretrhal Resection of BLadder Tumor (LARGE)  SURGEON:  Surgeon(s) and Role:    * [name], MD - Primary  PHYSICIAN ASSISTANT:   ASSISTANTS: none   ANESTHESIA:   general  EBL:  Total I/O In: 200 [I.V.:200] Out: -   BLOOD ADMINISTERED:none  DRAINS: foley to NS irrigation   LOCAL MEDICATIONS USED:  NONE  SPECIMEN:  Source of Specimen:  bladder tumor, base, bladder neck, prostatic urethra  DISPOSITION OF SPECIMEN:  PATHOLOGY  COUNTS:  YES  TOURNIQUET:  * No tourniquets in log *  DICTATION: .Other Dictation: Dictation Number [id number]  PLAN OF CARE: Admit for overnight observation  PATIENT DISPOSITION:  PACU - hemodynamically stable.   Delay start of Pharmacological VTE agent (>24hrs) due to surgical blood loss or risk of bleeding: yes

## 2015-03-31 NOTE — Transfer of Care (Signed)
Immediate Anesthesia Transfer of Care Note  Patient: Timothy Hickman  Procedure(s) Performed: Procedure(s) (LRB): TRANSURETHRAL RESECTION OF BLADDER TUMOR WITH GYRUS (TURBT-GYRUS) (N/A)  Patient Location: PACU  Anesthesia Type: General  Level of Consciousness: awake, sedated, patient cooperative and responds to stimulation  Airway & Oxygen Therapy: Patient Spontanous Breathing and Patient connected to face mask oxygen  Post-op Assessment: Report given to PACU RN, Post -op Vital signs reviewed and stable and Patient moving all extremities  Post vital signs: Reviewed and stable  Complications: No apparent anesthesia complications

## 2015-03-31 NOTE — Anesthesia Procedure Notes (Signed)
Procedure Name: LMA Insertion Date/Time: 03/31/2015 7:39 AM Performed by: Justice Rocher Pre-anesthesia Checklist: Patient identified, Emergency Drugs available, Suction available and Patient being monitored Patient Re-evaluated:Patient Re-evaluated prior to inductionOxygen Delivery Method: Circle System Utilized Preoxygenation: Pre-oxygenation with 100% oxygen Intubation Type: IV induction Ventilation: Mask ventilation without difficulty LMA: LMA inserted LMA Size: 5.0 Number of attempts: 1 Airway Equipment and Method: Bite block Placement Confirmation: positive ETCO2 Tube secured with: Tape Dental Injury: Teeth and Oropharynx as per pre-operative assessment

## 2015-04-01 DIAGNOSIS — C675 Malignant neoplasm of bladder neck: Secondary | ICD-10-CM | POA: Diagnosis not present

## 2015-04-01 NOTE — Progress Notes (Signed)
Completed discharge teaching with patient. Demonstrated proper use of foley and leg bag care. Pt understood. Answered all questions. Prescriptions given. Patient will be discharged home with family.

## 2015-04-01 NOTE — Progress Notes (Signed)
No events overnight Pain controlled No n/v/f/c  Filed Vitals:   03/31/15 1500 03/31/15 2227 04/01/15 0240 04/01/15 1029  BP: 118/59 116/62 120/70 144/84  Pulse: 80 74 81 63  Temp: 97.7 F (36.5 C) 97.8 F (36.6 C) 97.6 F (36.4 C) 97.9 F (36.6 C)  TempSrc: Oral Oral Oral Oral  Resp: 12 16 16 16   Height:      Weight:      SpO2: 98% 96% 98% 100%   CBI  NAD Soft NT ND Foley clear pink. No clots. CBI off  POD 1 TURBT for  Massive-volume bladder cancer, history of prior right nephroureterectomy  -doing well -d/c home with foley today -f/u with Dr. Zettie Pho nurse for foley removal on Monday

## 2015-04-01 NOTE — Discharge Summary (Signed)
Date of admission: 03/31/2015  Date of discharge: 04/01/2015  Admission diagnosis: Massive-volume bladder cancer, history of prior right nephroureterectomy   Discharge diagnosis: Massive-volume bladder cancer, history of prior right nephroureterectomy   Secondary diagnoses:  Patient Active Problem List   Diagnosis Date Noted  . Bladder cancer ([place]) 03/31/2015  . Perinephric hematoma 05/06/2014  . Leukocytosis 05/06/2014  . Accelerated hypertension 05/06/2014  . Right flank pain 05/06/2014  . Renal mass     History and Physical: For full details, please see admission history and physical. Briefly, Timothy Hickman is a 44 y.o. year old patient with Massive-volume bladder cancer, history of prior right nephroureterectomy presenting for TURBT .   Hospital Course: Patient tolerated the procedure well.  He was then transferred to the floor after an uneventful PACU stay.  His hospital course was uncomplicated.  On POD#1  he had met discharge criteria: was eating a regular diet, was up and ambulating independently,  pain was well controlled, and was ready to for discharge.  Foley was clear pink off CBI.   Laboratory values:   Recent Labs  03/31/15 0714  HGB 16.0  HCT 47.0    Recent Labs  03/31/15 0714  NA 143  K 3.8  CL 104  GLUCOSE 103*  BUN 21*  CREATININE 1.00   No results for input(s): LABPT, INR in the last 72 hours. No results for input(s): LABURIN in the last 72 hours. Results for orders placed or performed during the hospital encounter of 04/02/09  Rapid strep screen     Status: None   Collection Time: 04/02/09  1:24 PM  Result Value Ref Range Status   Streptococcus, Group A Screen (Direct) NEGATIVE NEGATIVE Final    Disposition: Home  Discharge instruction: The patient was instructed to be ambulatory but told to refrain from heavy lifting, strenuous activity, or driving.   Discharge medications:   Medication List    TAKE these medications        oxyCODONE-acetaminophen 5-325 MG tablet  Commonly known as:  ROXICET  Take 1-2 tablets by mouth every 6 (six) hours as needed for moderate pain or severe pain. Post-operatively     senna-docusate 8.6-50 MG tablet  Commonly known as:  Senokot-S  Take 1 tablet by mouth 2 (two) times daily. While taking pain meds to prevent constipation     sulfamethoxazole-trimethoprim 800-160 MG tablet  Commonly known as:  BACTRIM DS,SEPTRA DS  Take 1 tablet by mouth 2 (two) times daily. X 3 days to prevent post-op infection        Followup:      Follow-up Information    Follow up with [name], MD On 04/14/2015.   Specialty:  Urology   Why:  at 2:30PM for MD visit. Dr. Tresa Moore will call you with pathology results when available.    Contact information:   [place] [address] [phone]       Follow up with [name], MD On 04/03/2015.   Specialty:  Urology   Why:  1200 for nurse visit / catheter removal.    Contact information:   [place] [address] [phone]

## 2015-04-03 ENCOUNTER — Encounter (HOSPITAL_BASED_OUTPATIENT_CLINIC_OR_DEPARTMENT_OTHER): Payer: Self-pay | Admitting: Urology

## 2015-04-19 ENCOUNTER — Other Ambulatory Visit: Payer: Self-pay | Admitting: Urology

## 2015-05-02 ENCOUNTER — Encounter (HOSPITAL_BASED_OUTPATIENT_CLINIC_OR_DEPARTMENT_OTHER): Payer: Self-pay | Admitting: *Deleted

## 2015-05-05 ENCOUNTER — Encounter (HOSPITAL_BASED_OUTPATIENT_CLINIC_OR_DEPARTMENT_OTHER): Payer: Self-pay | Admitting: *Deleted

## 2015-05-05 NOTE — Progress Notes (Signed)
NPO AFTER MN W/ EXCEPTION CLEAR LIQUIDS UNTIL 0800 (NO CREAM/ MILK PRODUCTS).   ARRIVE AT 1230.  NEEDS HG. CURRENT EKG IN CHART AND EPIC.

## 2015-05-10 ENCOUNTER — Encounter (HOSPITAL_BASED_OUTPATIENT_CLINIC_OR_DEPARTMENT_OTHER)
Admission: RE | Disposition: A | Discharge status: Home / Self Care | Payer: Self-pay | Source: Ambulatory Visit | Attending: Urology

## 2015-05-10 ENCOUNTER — Ambulatory Visit (HOSPITAL_BASED_OUTPATIENT_CLINIC_OR_DEPARTMENT_OTHER): Payer: BLUE CROSS/BLUE SHIELD | Admitting: Anesthesiology

## 2015-05-10 ENCOUNTER — Encounter (HOSPITAL_BASED_OUTPATIENT_CLINIC_OR_DEPARTMENT_OTHER): Payer: Self-pay | Admitting: *Deleted

## 2015-05-10 ENCOUNTER — Ambulatory Visit (HOSPITAL_BASED_OUTPATIENT_CLINIC_OR_DEPARTMENT_OTHER)
Admission: RE | Admit: 2015-05-10 | Discharge: 2015-05-10 | Disposition: A | Discharge status: Home / Self Care | Payer: BLUE CROSS/BLUE SHIELD | Source: Ambulatory Visit | Attending: Urology | Admitting: Urology

## 2015-05-10 DIAGNOSIS — N189 Chronic kidney disease, unspecified: Secondary | ICD-10-CM | POA: Insufficient documentation

## 2015-05-10 DIAGNOSIS — I129 Hypertensive chronic kidney disease with stage 1 through stage 4 chronic kidney disease, or unspecified chronic kidney disease: Secondary | ICD-10-CM | POA: Insufficient documentation

## 2015-05-10 DIAGNOSIS — F17210 Nicotine dependence, cigarettes, uncomplicated: Secondary | ICD-10-CM | POA: Insufficient documentation

## 2015-05-10 DIAGNOSIS — C679 Malignant neoplasm of bladder, unspecified: Secondary | ICD-10-CM | POA: Insufficient documentation

## 2015-05-10 DIAGNOSIS — Z8553 Personal history of malignant neoplasm of renal pelvis: Secondary | ICD-10-CM | POA: Diagnosis not present

## 2015-05-10 DIAGNOSIS — N3080 Other cystitis without hematuria: Secondary | ICD-10-CM | POA: Diagnosis not present

## 2015-05-10 DIAGNOSIS — Z905 Acquired absence of kidney: Secondary | ICD-10-CM | POA: Diagnosis not present

## 2015-05-10 HISTORY — PX: TRANSURETHRAL RESECTION OF BLADDER TUMOR WITH GYRUS (TURBT-GYRUS): SHX6458

## 2015-05-10 HISTORY — DX: Reserved for concepts with insufficient information to code with codable children: IMO0002

## 2015-05-10 HISTORY — DX: Renal agenesis, unilateral: Q60.0

## 2015-05-10 LAB — POCT HEMOGLOBIN-HEMACUE: Hemoglobin: 15.8 g/dL (ref 13.0–17.0)

## 2015-05-10 SURGERY — TRANSURETHRAL RESECTION OF BLADDER TUMOR WITH GYRUS (TURBT-GYRUS)
Anesthesia: General | Site: Bladder

## 2015-05-10 MED ORDER — STERILE WATER FOR IRRIGATION IR SOLN
Status: DC | PRN
  Administered 2015-05-10: 500 mL

## 2015-05-10 MED ORDER — PROPOFOL 10 MG/ML IV BOLUS
INTRAVENOUS | Status: AC
  Filled 2015-05-10: qty 20

## 2015-05-10 MED ORDER — DEXTROSE 5 % IV SOLN
1.0000 g | INTRAVENOUS | Status: DC
  Filled 2015-05-10: qty 10

## 2015-05-10 MED ORDER — PROMETHAZINE HCL 25 MG/ML IJ SOLN
6.2500 mg | INTRAMUSCULAR | Status: DC | PRN
  Filled 2015-05-10: qty 1

## 2015-05-10 MED ORDER — DEXTROSE 5 % IV SOLN
INTRAVENOUS | Status: AC
  Filled 2015-05-10: qty 50

## 2015-05-10 MED ORDER — OXYCODONE-ACETAMINOPHEN 5-325 MG PO TABS: 1.0000 | ORAL_TABLET | Freq: Four times a day (QID) | ORAL | Status: DC | PRN

## 2015-05-10 MED ORDER — PROPOFOL 10 MG/ML IV BOLUS
INTRAVENOUS | Status: DC | PRN
  Administered 2015-05-10: 240 mg via INTRAVENOUS

## 2015-05-10 MED ORDER — ONDANSETRON HCL 4 MG/2ML IJ SOLN
INTRAMUSCULAR | Status: AC
  Filled 2015-05-10: qty 2

## 2015-05-10 MED ORDER — ONDANSETRON HCL 4 MG/2ML IJ SOLN
INTRAMUSCULAR | Status: DC | PRN
  Administered 2015-05-10: 4 mg via INTRAVENOUS

## 2015-05-10 MED ORDER — LACTATED RINGERS IV SOLN
INTRAVENOUS | Status: DC
  Filled 2015-05-10: qty 1000

## 2015-05-10 MED ORDER — DEXAMETHASONE SODIUM PHOSPHATE 10 MG/ML IJ SOLN
INTRAMUSCULAR | Status: AC
  Filled 2015-05-10: qty 1

## 2015-05-10 MED ORDER — DEXAMETHASONE SODIUM PHOSPHATE 4 MG/ML IJ SOLN
INTRAMUSCULAR | Status: DC | PRN
  Administered 2015-05-10: 10 mg via INTRAVENOUS

## 2015-05-10 MED ORDER — FENTANYL CITRATE (PF) 100 MCG/2ML IJ SOLN
INTRAMUSCULAR | Status: AC
  Filled 2015-05-10: qty 2

## 2015-05-10 MED ORDER — SENNOSIDES-DOCUSATE SODIUM 8.6-50 MG PO TABS
1.0000 | ORAL_TABLET | Freq: Two times a day (BID) | ORAL | Status: DC
Start: 2015-05-10 — End: 2015-06-19

## 2015-05-10 MED ORDER — FENTANYL CITRATE (PF) 100 MCG/2ML IJ SOLN
INTRAMUSCULAR | Status: DC | PRN
  Administered 2015-05-10 (×3): 50 ug via INTRAVENOUS

## 2015-05-10 MED ORDER — OXYCODONE-ACETAMINOPHEN 5-325 MG PO TABS
ORAL_TABLET | ORAL | Status: AC
  Filled 2015-05-10: qty 1

## 2015-05-10 MED ORDER — LACTATED RINGERS IV SOLN
INTRAVENOUS | Status: DC
  Administered 2015-05-10: 13:00:00 via INTRAVENOUS
  Filled 2015-05-10: qty 1000

## 2015-05-10 MED ORDER — CEFTRIAXONE SODIUM 2 G IJ SOLR
INTRAMUSCULAR | Status: AC
  Filled 2015-05-10: qty 2

## 2015-05-10 MED ORDER — MIDAZOLAM HCL 2 MG/2ML IJ SOLN
INTRAMUSCULAR | Status: AC
  Filled 2015-05-10: qty 2

## 2015-05-10 MED ORDER — MEPERIDINE HCL 25 MG/ML IJ SOLN
6.2500 mg | INTRAMUSCULAR | Status: DC | PRN
  Filled 2015-05-10: qty 1

## 2015-05-10 MED ORDER — MIDAZOLAM HCL 5 MG/5ML IJ SOLN
INTRAMUSCULAR | Status: DC | PRN
  Administered 2015-05-10: 2 mg via INTRAVENOUS

## 2015-05-10 MED ORDER — LIDOCAINE HCL (CARDIAC) 20 MG/ML IV SOLN
INTRAVENOUS | Status: AC
  Filled 2015-05-10: qty 5

## 2015-05-10 MED ORDER — SODIUM CHLORIDE 0.9 % IR SOLN
Status: DC | PRN
  Administered 2015-05-10 (×2): 1000 mL via INTRAVESICAL
  Administered 2015-05-10: 3000 mL via INTRAVESICAL
  Administered 2015-05-10 (×4): 1000 mL via INTRAVESICAL

## 2015-05-10 MED ORDER — LIDOCAINE HCL (CARDIAC) 20 MG/ML IV SOLN
INTRAVENOUS | Status: DC | PRN
  Administered 2015-05-10: 100 mg via INTRAVENOUS

## 2015-05-10 MED ORDER — DEXTROSE 5 % IV SOLN
2.0000 g | INTRAVENOUS | Status: AC
  Administered 2015-05-10: 2 g via INTRAVENOUS
  Filled 2015-05-10: qty 2

## 2015-05-10 MED ORDER — HYDROMORPHONE HCL 1 MG/ML IJ SOLN
0.2500 mg | INTRAMUSCULAR | Status: DC | PRN
  Filled 2015-05-10: qty 1

## 2015-05-10 SURGICAL SUPPLY — 32 items
BAG URINE DRAINAGE (UROLOGICAL SUPPLIES) IMPLANT
BAG URINE LEG 19OZ MD ST LTX (BAG) IMPLANT
BAG URINE LEG 500ML (DRAIN) IMPLANT
BAG URO CATCHER STRL LF (MISCELLANEOUS) ×3 IMPLANT
CATH FOLEY 2WAY SLVR  5CC 22FR (CATHETERS)
CATH FOLEY 2WAY SLVR 30CC 20FR (CATHETERS) IMPLANT
CATH FOLEY 2WAY SLVR 5CC 22FR (CATHETERS) IMPLANT
CLOTH BEACON ORANGE TIMEOUT ST (SAFETY) ×3 IMPLANT
ELECT BIVAP BIPO 22/24 DONUT (ELECTROSURGICAL)
ELECT REM PT RETURN 9FT ADLT (ELECTROSURGICAL) ×3
ELECTRD BIVAP BIPO 22/24 DONUT (ELECTROSURGICAL) IMPLANT
ELECTRODE REM PT RTRN 9FT ADLT (ELECTROSURGICAL) ×1 IMPLANT
EVACUATOR MICROVAS BLADDER (UROLOGICAL SUPPLIES) IMPLANT
GLOVE BIO SURGEON STRL SZ 6.5 (GLOVE) ×2 IMPLANT
GLOVE BIO SURGEON STRL SZ7.5 (GLOVE) ×3 IMPLANT
GLOVE BIO SURGEONS STRL SZ 6.5 (GLOVE) ×1
GLOVE INDICATOR 6.5 STRL GRN (GLOVE) ×6 IMPLANT
GOWN STRL REUS W/ TWL LRG LVL3 (GOWN DISPOSABLE) ×1 IMPLANT
GOWN STRL REUS W/ TWL XL LVL3 (GOWN DISPOSABLE) ×1 IMPLANT
GOWN STRL REUS W/TWL LRG LVL3 (GOWN DISPOSABLE) ×2
GOWN STRL REUS W/TWL XL LVL3 (GOWN DISPOSABLE) ×2
IV NS 1000ML (IV SOLUTION) ×12
IV NS 1000ML BAXH (IV SOLUTION) ×6 IMPLANT
IV NS IRRIG 3000ML ARTHROMATIC (IV SOLUTION) ×3 IMPLANT
KIT ROOM TURNOVER WOR (KITS) ×3 IMPLANT
LOOP CUT BIPOLAR 24F LRG (ELECTROSURGICAL) ×3 IMPLANT
MANIFOLD NEPTUNE II (INSTRUMENTS) ×3 IMPLANT
PACK CYSTO (CUSTOM PROCEDURE TRAY) ×3 IMPLANT
SET ASPIRATION TUBING (TUBING) ×3 IMPLANT
SYRINGE IRR TOOMEY STRL 70CC (SYRINGE) IMPLANT
TUBE CONNECTING 12'X1/4 (SUCTIONS) ×1
TUBE CONNECTING 12X1/4 (SUCTIONS) ×2 IMPLANT

## 2015-05-10 NOTE — Discharge Instructions (Signed)
1 - You may have urinary urgency (bladder spasms) and bloody urine on / off for up to 2 weeks. This is normal.  2 - Call MD or go to ER for fever >102, severe pain / nausea / vomiting not relieved by medications, or acute change in medical status  CYSTOSCOPY HOME CARE INSTRUCTIONS  Activity: Rest for the remainder of the day.  Do not drive or operate equipment today.  You may resume normal activities in one to two days as instructed by your physician.   Meals: Drink plenty of liquids and eat light foods such as gelatin or soup this evening.  You may return to a normal meal plan tomorrow.  Return to Work: You may return to work in one to two days or as instructed by your physician.  Special Instructions / Symptoms: Call your physician if any of these symptoms occur:   -persistent or heavy bleeding  -bleeding which continues after first few urination  -large blood clots that are difficult to pass  -urine stream diminishes or stops completely  -fever equal to or higher than 101 degrees Farenheit.  -cloudy urine with a strong, foul odor  -severe pain  Females should always wipe from front to back after elimination.  You may feel some burning pain when you urinate.  This should disappear with time.  Applying moist heat to the lower abdomen or a hot tub bath may help relieve the pain. \  Follow-Up / Date of Return Visit to Your Physician:    Call for an appointment to arrange follow-up.  Patient Signature:  ________________________________________________________  Nurse's Signature:  ________________________________________________________  Post Anesthesia Home Care Instructions  Activity: Get plenty of rest for the remainder of the day. A responsible adult should stay with you for 24 hours following the procedure.  For the next 24 hours, DO NOT: -Drive a car -Paediatric nurse -Drink alcoholic beverages -Take any medication unless instructed by your physician -Make any legal  decisions or sign important papers.  Meals: Start with liquid foods such as gelatin or soup. Progress to regular foods as tolerated. Avoid greasy, spicy, heavy foods. If nausea and/or vomiting occur, drink only clear liquids until the nausea and/or vomiting subsides. Call your physician if vomiting continues.  Special Instructions/Symptoms: Your throat may feel dry or sore from the anesthesia or the breathing tube placed in your throat during surgery. If this causes discomfort, gargle with warm salt water. The discomfort should disappear within 24 hours.  If you had a scopolamine patch placed behind your ear for the management of post- operative nausea and/or vomiting:  1. The medication in the patch is effective for 72 hours, after which it should be removed.  Wrap patch in a tissue and discard in the trash. Wash hands thoroughly with soap and water. 2. You may remove the patch earlier than 72 hours if you experience unpleasant side effects which may include dry mouth, dizziness or visual disturbances. 3. Avoid touching the patch. Wash your hands with soap and water after contact with the patch.

## 2015-05-10 NOTE — Anesthesia Procedure Notes (Signed)
Procedure Name: LMA Insertion Date/Time: 05/10/2015 1:27 PM Performed by: Bethena Roys T Pre-anesthesia Checklist: Patient identified, Emergency Drugs available, Suction available and Patient being monitored Patient Re-evaluated:Patient Re-evaluated prior to inductionOxygen Delivery Method: Circle System Utilized Preoxygenation: Pre-oxygenation with 100% oxygen Intubation Type: IV induction Ventilation: Mask ventilation without difficulty LMA: LMA inserted LMA Size: 5.0 Number of attempts: 1 Airway Equipment and Method: Bite block Placement Confirmation: positive ETCO2 Dental Injury: Teeth and Oropharynx as per pre-operative assessment

## 2015-05-10 NOTE — Anesthesia Preprocedure Evaluation (Addendum)
Anesthesia Evaluation  Patient identified by MRN, date of birth, ID band Patient awake    Reviewed: Allergy & Precautions, NPO status , Patient's Chart, lab work & pertinent test results  Airway Mallampati: II  TM Distance: >3 FB Neck ROM: Full    Dental  (+) Teeth Intact   Pulmonary Current Smoker,    breath sounds clear to auscultation       Cardiovascular hypertension,  Rhythm:Regular Rate:Normal     Neuro/Psych negative neurological ROS  negative psych ROS   GI/Hepatic negative GI ROS, Neg liver ROS,   Endo/Other  negative endocrine ROS  Renal/GU Renal InsufficiencyRenal disease  negative genitourinary   Musculoskeletal negative musculoskeletal ROS (+)   Abdominal   Peds negative pediatric ROS (+)  Hematology negative hematology ROS (+)   Anesthesia Other Findings   Reproductive/Obstetrics negative OB ROS                            Lab Results  Component Value Date   WBC 10.2 12/21/2014   HGB 16.0 03/31/2015   HCT 47.0 03/31/2015   MCV 96.6 12/21/2014   PLT 231 12/21/2014   Lab Results  Component Value Date   CREATININE 1.00 03/31/2015   BUN 21* 03/31/2015   NA 143 03/31/2015   K 3.8 03/31/2015   CL 104 03/31/2015   CO2 24 12/21/2014   Lab Results  Component Value Date   INR 1.05 05/07/2014   06/01/2014 EKG: normal EKG, normal sinus rhythm.    Anesthesia Physical Anesthesia Plan  ASA: II  Anesthesia Plan: General   Post-op Pain Management:    Induction: Intravenous  Airway Management Planned: LMA  Additional Equipment:   Intra-op Plan:   Post-operative Plan: Extubation in OR  Informed Consent: I have reviewed the patients History and Physical, chart, labs and discussed the procedure including the risks, benefits and alternatives for the proposed anesthesia with the patient or authorized representative who has indicated his/her understanding and  acceptance.   Dental advisory given  Plan Discussed with: CRNA  Anesthesia Plan Comments:         Anesthesia Quick Evaluation

## 2015-05-10 NOTE — Brief Op Note (Signed)
05/10/2015  2:08 PM  PATIENT:  Timothy Hickman  45 y.o. male  PRE-OPERATIVE DIAGNOSIS:  BLADDER CANCER  POST-OPERATIVE DIAGNOSIS:  BLADDER CANCER  PROCEDURE:  Procedure(s): TRANSURETHRAL RESECTION OF BLADDER TUMOR WITH GYRUS (TURBT-GYRUS) (N/A)  SURGEON:  Surgeon(s) and Role:    * [name], MD - Primary  PHYSICIAN ASSISTANT:   ASSISTANTS: none   ANESTHESIA:   general  EBL:  Total I/O In: 450 [I.V.:450] Out: -   BLOOD ADMINISTERED:none  DRAINS: none   LOCAL MEDICATIONS USED:  NONE  SPECIMEN:  Source of Specimen:  1 - residual / recurrent bladder tumor, 2 - prostatic urethral biopsy  DISPOSITION OF SPECIMEN:  PATHOLOGY  COUNTS:  YES  TOURNIQUET:  * No tourniquets in log *  DICTATION: .Other Dictation: Dictation Number [id number]  PLAN OF CARE: Discharge to home after PACU  PATIENT DISPOSITION:  PACU - hemodynamically stable.   Delay start of Pharmacological VTE agent (>24hrs) due to surgical blood loss or risk of bleeding: yes

## 2015-05-10 NOTE — Transfer of Care (Signed)
Immediate Anesthesia Transfer of Care Note  Patient: Timothy Hickman  Procedure(s) Performed: Procedure(s): TRANSURETHRAL RESECTION OF BLADDER TUMOR WITH GYRUS (TURBT-GYRUS) (N/A)  Patient Location: PACU  Anesthesia Type:General  Level of Consciousness: awake, alert  and oriented  Airway & Oxygen Therapy: Patient Spontanous Breathing and Patient connected to nasal cannula oxygen  Post-op Assessment: Report given to RN  Post vital signs: Reviewed and stable  Last Vitals:  Filed Vitals:   05/10/15 1259  BP: 155/95  Pulse: 86  Temp: 36.9 C  Resp: 18    Complications: No apparent anesthesia complications

## 2015-05-10 NOTE — Anesthesia Postprocedure Evaluation (Signed)
Anesthesia Post Note  Patient: Timothy Hickman  Procedure(s) Performed: Procedure(s) (LRB): TRANSURETHRAL RESECTION OF BLADDER TUMOR WITH GYRUS (TURBT-GYRUS) (N/A)  Patient location during evaluation: PACU Anesthesia Type: General Level of consciousness: awake and alert Pain management: pain level controlled Vital Signs Assessment: post-procedure vital signs reviewed and stable Respiratory status: spontaneous breathing, nonlabored ventilation, respiratory function stable and patient connected to nasal cannula oxygen Cardiovascular status: blood pressure returned to baseline and stable Postop Assessment: no signs of nausea or vomiting Anesthetic complications: no    Last Vitals:  Filed Vitals:   05/10/15 1438 05/10/15 1445  BP: 158/103 177/97  Pulse: 76 75  Temp:    Resp: 15 12    Last Pain:  Filed Vitals:   05/10/15 1451  PainSc: Sheridan Xue Low

## 2015-05-10 NOTE — H&P (Signed)
Timothy Hickman is an 45 y.o. male.    Chief Complaint: PRe-OP Restaging Transurethral Resection of bLadder Tumor  HPI:   1 - Right Renal Pelvis Cancer - s/p right robotic nephro-uretrectomy with bladder cuff excision and paracaval lymphadenectomy 05/27/14 for pTaN0Mx large volume urothelial carcinoma. Pre-op staging with several enlarged paracaval lymph nodes that fortunately were negative for cancer spread by surgical pathology.  Recent Surveillance: 03/2015 - CT, CXR no upper tract recurrence, large bladder cancer noted.   2 - Solitary Left Kidney - s/p right nephroureterectomy as per above. Post-op Cr <1 2016.   3 - Bladder Cancer -  03/2015 - T1G3, very large volume (8cm2) by TURBT. Muscle present and negative, prostatic urethral biopsies negative, but gross bladder neck involvmeent. CT, CXR clinically localized.   PMH unremarkable. NO CV disease. No blood thinners. No current PCP.  Today " Timothy Hickman " is seen to proceed with restaging TURBT. NO interval fevers. Most recent UCX negative.   Past Medical History  Diagnosis Date  . Borderline hypertension   . Bladder cancer ([place])   . Urinary hesitancy   . History of cardiac murmur as a child   . Solitary left kidney   . Chronic kidney disease   . History of renal pelvis cancer urologist-  dr Tresa Moore    dx 01/ 2016--  High grade papillary urothelial carinoma upper ureter and renal pelvis (pTa N0 Mx) w/ negative lymph nodes--  s/p  right nephroureterectomy 05-27-2014    Past Surgical History  Procedure Laterality Date  . Cystoscopy with ureteroscopy Right 05/05/2014    Procedure: CYSTOSCOPY/ RIGHT URETEROSCOPY/ RIGHT RETROGRADE PYLEGRAM WITH INTERPRETATION/ RIGHT URETERAL BALLOON DILATION OF UPJ OBSTRUCTION/ BIOPSY OF RIGHT RENAL PELVIC MASS;  Surgeon: Ailene Rud, MD;  Location: [facility];  Service: Urology;  Laterality: Right;  . Cystoscopy w/ ureteral stent placement Right 05/07/2014    Procedure: CYSTOSCOPY WITH  RETROGRADE PYELOGRAM/RIGHT DIAGNOSTIC URETEROSCOPY, RIGHT URETERAL STENT PLACEMENT;  Surgeon: Raynelle Bring, MD;  Location: [facility];  Service: Urology;  Laterality: Right;  . Robot assited laparoscopic nephroureterectomy Right 05/27/2014    Procedure: ROBOT ASSITED LAPAROSCOPIC NEPHROURETERECTOMY;  Surgeon: [name], MD;  Location: [facility];  Service: Urology;  Laterality: Right;  . Transurethral resection of bladder tumor with gyrus (turbt-gyrus) N/A 03/31/2015    Procedure: TRANSURETHRAL RESECTION OF BLADDER TUMOR WITH GYRUS (TURBT-GYRUS);  Surgeon: [name], MD;  Location: [facility];  Service: Urology;  Laterality: N/A;    Family History  Problem Relation Age of Onset  . Breast cancer Mother   . HIV/AIDS Father    Social History:  reports that he has been smoking Cigarettes.  He has a 10 pack-year smoking history. He has never used smokeless tobacco. He reports that he drinks alcohol. He reports that he does not use illicit drugs.  Allergies:  Allergies  Allergen Reactions  . Valium Swelling    Face and hands    No prescriptions prior to admission    No results found for this or any previous visit (from the past 48 hour(s)). No results found.  Review of Systems  Constitutional: Negative.   HENT: Negative.   Eyes: Negative.   Respiratory: Negative.   Cardiovascular: Negative.   Gastrointestinal: Negative.   Genitourinary: Negative.   Musculoskeletal: Negative.   Skin: Negative.   Neurological: Negative.   Endo/Heme/Allergies: Negative.   Psychiatric/Behavioral: Negative.     Height 5\' 7"  (1.702 m), weight 95.255 kg (210 lb). Physical Exam  Constitutional:  He appears well-developed.  HENT:  Head: Normocephalic.  Eyes: Pupils are equal, round, and reactive to light.  Neck: Normal range of motion.  Cardiovascular: Normal rate.   Respiratory: Effort normal.  GI: Soft.  Genitourinary: Penis normal.  Musculoskeletal: Normal range of motion.   Neurological: He is alert.  Skin: Skin is warm.  Psychiatric: He has a normal mood and affect. His behavior is normal. Judgment and thought content normal.     Assessment/Plan   1 - Right Renal Pelvis Cancer - Reinforced post-op protocol with Q60mo CT,CXR,CMP, cysto x 2 years, then yearly.  He is now up to date.   2 - Solitary Left Kidney - GFR remains excellent.   3 - Bladder Cancer - volume and high-grade nature make cystoprostatectomy with left cutaneous ureterostomy best chance of durable cure. Given his h/o non-compliance, this would also obviate very frequent cysto. Bladder preservation with re-TURBT / Mito-C followed by induction BCG and high-grade surveillance also discussed.   He is aprehensive abtou cystectomy due to very real sexual concerns which is understandable. Will begin path of bladder preservation but we all agree very short threshold for cystectomy if becomes T2 or BCG refractory.  Will perform restaging TURBT today as planned He has been through before and understnads peri-op course. Risks, benefits, alternatives discussed.    Timothy Hickman 05/10/2015, 6:53 AM

## 2015-05-11 ENCOUNTER — Encounter (HOSPITAL_BASED_OUTPATIENT_CLINIC_OR_DEPARTMENT_OTHER): Payer: Self-pay | Admitting: Urology

## 2015-05-11 NOTE — Op Note (Deleted)
NAMEJEWELL, Timothy NO.:  Hickman  MEDICAL RECORD NO.:  [id number]  LOCATION:                               FACILITY:  [facility]  PHYSICIAN:  [name], MD     DATE OF BIRTH:  Mar 19, 1971  DATE OF PROCEDURE: 05/10/2015                              OPERATIVE REPORT  PREOPERATIVE DIAGNOSIS:  Recurrent high-grade bladder cancer.  PROCEDURE:  Transurethral Resection of bladder tumor, volume medium.  ESTIMATED BLOOD LOSS:  Nil.  COMPLICATION:  None.  SPECIMEN: 1. Residual versus recurrent bladder tumor fragments. 2. Prostatic urethral biopsies for permanent pathology.  FINDINGS: 1. Moderate volume residual bladder tumor including sessile tumor at     the dome. 2. Small volume rapidly recurrent bladder tumor including several     papillary tumors at the inter-trigone area and the bladder neck. 3. Total surface area of volume resected approximately 3.5 cm.  DRAINS:  None.  INDICATION:  Timothy Hickman is a pleasant 45 year old gentleman with history of recurrent high-grade urothelial carcinoma.  He is status post prior nephroureterectomy.  He has been variably compliant with cancer surveillance.  He was found on workup of recurrent gross hematuria after a period of absence from fall to have a massive volume bladder cancer.  This was clinically localized. He underwent transurethral resection approximately 6 weeks ago, at which point, he had multifocal very large volume, high-grade disease, but no obvious muscle invasive disease.  He now presents for restaging.   PROCEDURE IN DETAIL:  The patient being Timothy Hickman was verified. Procedure being transition of bladder tumor was confirmed.  Procedure was carried out.  Time-out was performed.  Intravenous antibiotics were administered.  General anesthesia introduced.  The patient was placed into a low lithotomy position, and sterile field was created by prepping and draping the patient's penis, perineum, proximal  thighs using iodine x3.  Next, cystourethroscopy was performed using a 26-French continuous flow resectoscope sheath with visual obturator.  Inspection of anterior and posterior urethra revealed some likely recurrent bladder tumor at the area of the bladder neck.  Inspection of the bladder revealed diffuse edema of the bladder and healing of prior areas of resection. There were some small areas of obvious rapidly recurrent tumor.  Photo documentation was performed.  It was also a very large residual sessile tumor at the dome that appeared to have some necrotic features as well. Photo documentation was performed.  Its size did not appear amenable to endoscopic resection.  Resectoscope loop was then used to resect systematically.  The previous resection site and area of recurrent tumor in the inter-trigone area with a diameter of 3.5 cm.  These fragments were set aside for permanent pathology, labeled this recurrent bladder tumor.  Samples of the dome lesion were also taken and set aside for permanent Pathology.  The base of all these areas was then fulgurated. Cold cup biopsy forceps were then used to take representative biopsies of the prostatic urethra as it was felt this constellation of findings was indicative and need for radical cystectomy that this would aid and planning the need for urethrectomy or not.  The base of these areas were fulgurated.  Hemostasis appeared excellent.  All counts were correct. The bladder was partially emptied per cystoscope and procedure was terminated.  The patient tolerated the procedure well with no immediate periprocedural complications.  The patient was taken to the Postanesthesia Care Unit in stable condition.          ______________________________ [name], MD     TM/MEDQ  D:  05/10/2015  T:  05/10/2015  Job:  [id number]

## 2015-05-11 NOTE — Op Note (Deleted)
NAMEARYIAN, GIOVINO NO.:  [id number]  MEDICAL RECORD NO.:  [id number]  LOCATION:                               FACILITY:  [facility]  PHYSICIAN:  [name], MD     DATE OF BIRTH:  06-Oct-1970  DATE OF PROCEDURE: 05/10/2015                              OPERATIVE REPORT  PREOPERATIVE DIAGNOSIS:  Recurrent high-grade bladder cancer.  PROCEDURE:  Transurethral Resection of bladder tumor, volume medium.  ESTIMATED BLOOD LOSS:  Nil.  COMPLICATION:  None.  SPECIMEN: 1. Residual versus recurrent bladder tumor fragments. 2. Prostatic urethral biopsies for permanent pathology.  FINDINGS: 1. Moderate volume residual bladder tumor including sessile tumor at     the dome. 2. Small volume rapidly recurrent bladder tumor including several     papillary tumors at the inter-trigone area and the bladder neck. 3. Total surface area of volume resected approximately 3.5 cm.  DRAINS:  None.  INDICATION:  Mr. Gianni is a pleasant 45 year old gentleman with history of recurrent high-grade urothelial carcinoma.  He is status post prior nephroureterectomy.  He has been variably compliant with cancer surveillance.  He was found on workup of recurrent gross hematuria after a period of absence from fall to have a massive volume bladder cancer.  This was clinically localized. He underwent transurethral resection approximately 6 weeks ago, at which point, he had multifocal very large volume, high-grade disease, but no obvious muscle invasive disease.  He now presents for restaging.   PROCEDURE IN DETAIL:  The patient being Dashay Soelberg was verified. Procedure being transition of bladder tumor was confirmed.  Procedure was carried out.  Time-out was performed.  Intravenous antibiotics were administered.  General anesthesia introduced.  The patient was placed into a low lithotomy position, and sterile field was created by prepping and draping the patient's penis, perineum, proximal  thighs using iodine x3.  Next, cystourethroscopy was performed using a 26-French continuous flow resectoscope sheath with visual obturator.  Inspection of anterior and posterior urethra revealed some likely recurrent bladder tumor at the area of the bladder neck.  Inspection of the bladder revealed diffuse edema of the bladder and healing of prior areas of resection. There were some small areas of obvious rapidly recurrent tumor.  Photo documentation was performed.  It was also a very large residual sessile tumor at the dome that appeared to have some necrotic features as well. Photo documentation was performed.  Its size did not appear amenable to endoscopic resection.  Resectoscope loop was then used to resect systematically.  The previous resection site and area of recurrent tumor in the inter-trigone area with a diameter of 3.5 cm.  These fragments were set aside for permanent pathology, labeled this recurrent bladder tumor.  Samples of the dome lesion were also taken and set aside for permanent Pathology.  The base of all these areas was then fulgurated. Cold cup biopsy forceps were then used to take representative biopsies of the prostatic urethra as it was felt this constellation of findings was indicative and need for radical cystectomy that this would aid and planning the need for urethrectomy or not.  The base of these areas were fulgurated.  Hemostasis appeared excellent.  All counts were correct. The bladder was partially emptied per cystoscope and procedure was terminated.  The patient tolerated the procedure well with no immediate periprocedural complications.  The patient was taken to the Postanesthesia Care Unit in stable condition.          ______________________________ [name], MD     TM/MEDQ  D:  05/10/2015  T:  05/10/2015  Job:  [id number]

## 2015-05-11 NOTE — Op Note (Signed)
NAMEKAHLEN, HEYER NO.:  [id number]  MEDICAL RECORD NO.:  [id number]  LOCATION:                               FACILITY:  [facility]  PHYSICIAN:  [name], MD     DATE OF BIRTH:  [birth year]  DATE OF PROCEDURE: 05/10/2015                              OPERATIVE REPORT  PREOPERATIVE DIAGNOSIS:  Recurrent high-grade bladder cancer.  PROCEDURE:  Transurethral Resection of bladder tumor, volume medium.  ESTIMATED BLOOD LOSS:  Nil.  COMPLICATION:  None.  SPECIMEN: 1. Residual versus recurrent bladder tumor fragments. 2. Prostatic urethral biopsies for permanent pathology.  FINDINGS: 1. Moderate volume residual bladder tumor including sessile tumor at     the dome. 2. Small volume rapidly recurrent bladder tumor including several     papillary tumors at the inter-trigone area and the bladder neck. 3. Total surface area of volume resected approximately 3.5 cm.  DRAINS:  None.  INDICATION:  Timothy Hickman is a pleasant 45 year old gentleman with history of recurrent high-grade urothelial carcinoma.  He is status post prior nephroureterectomy.  He has been variably compliant with cancer surveillance.  He was found on workup of recurrent gross hematuria after a period of absence from fall to have a massive volume bladder cancer.  This was clinically localized. He underwent transurethral resection approximately 6 weeks ago, at which point, he had multifocal very large volume, high-grade disease, but no obvious muscle invasive disease.  He now presents for restaging.   PROCEDURE IN DETAIL:  The patient being Timothy Hickman was verified. Procedure being transition of bladder tumor was confirmed.  Procedure was carried out.  Time-out was performed.  Intravenous antibiotics were administered.  General anesthesia introduced.  The patient was placed into a low lithotomy position, and sterile field was created by prepping and draping the patient's penis, perineum, proximal  thighs using iodine x3.  Next, cystourethroscopy was performed using a 26-French continuous flow resectoscope sheath with visual obturator.  Inspection of anterior and posterior urethra revealed some likely recurrent bladder tumor at the area of the bladder neck.  Inspection of the bladder revealed diffuse edema of the bladder and healing of prior areas of resection. There were some small areas of obvious rapidly recurrent tumor.  Photo documentation was performed.  It was also a very large residual sessile tumor at the dome that appeared to have some necrotic features as well. Photo documentation was performed.  Its size did not appear amenable to endoscopic resection.  Resectoscope loop was then used to resect systematically.  The previous resection site and area of recurrent tumor in the inter-trigone area with a diameter of 3.5 cm.  These fragments were set aside for permanent pathology, labeled this recurrent bladder tumor.  Samples of the dome lesion were also taken and set aside for permanent Pathology.  The base of all these areas was then fulgurated. Cold cup biopsy forceps were then used to take representative biopsies of the prostatic urethra as it was felt this constellation of findings was indicative and need for radical cystectomy that this would aid and planning the need for urethrectomy or not.  The base of these areas were fulgurated.  Hemostasis appeared excellent.  All counts were correct. The bladder was partially emptied per cystoscope and procedure was terminated.  The patient tolerated the procedure well with no immediate periprocedural complications.  The patient was taken to the Postanesthesia Care Unit in stable condition.          ______________________________ [name], MD     TM/MEDQ  D:  05/10/2015  T:  05/10/2015  Job:  [id number]

## 2015-05-15 ENCOUNTER — Other Ambulatory Visit: Payer: Self-pay | Admitting: Urology

## 2015-06-13 NOTE — Progress Notes (Signed)
Left message for patient to call RN back and make sure he does not need pre op apppointment prior to [facility] admission

## 2015-06-14 NOTE — Progress Notes (Addendum)
Spoke with patient mother Timothy Hickman, pt has no problems with anesthesia in past, no heart problems. No known lung issues.  Patient snores but no cpap used.

## 2015-06-15 ENCOUNTER — Inpatient Hospital Stay (HOSPITAL_COMMUNITY)
Admission: RE | Admit: 2015-06-15 | Discharge: 2015-06-19 | DRG: 655 | Disposition: A | Discharge status: Home / Self Care | Payer: BLUE CROSS/BLUE SHIELD | Source: Ambulatory Visit | Attending: Urology | Admitting: Urology

## 2015-06-15 ENCOUNTER — Encounter (HOSPITAL_COMMUNITY): Payer: Self-pay

## 2015-06-15 DIAGNOSIS — F17210 Nicotine dependence, cigarettes, uncomplicated: Secondary | ICD-10-CM | POA: Diagnosis present

## 2015-06-15 DIAGNOSIS — K660 Peritoneal adhesions (postprocedural) (postinfection): Secondary | ICD-10-CM | POA: Diagnosis present

## 2015-06-15 DIAGNOSIS — C679 Malignant neoplasm of bladder, unspecified: Secondary | ICD-10-CM | POA: Diagnosis present

## 2015-06-15 DIAGNOSIS — N189 Chronic kidney disease, unspecified: Secondary | ICD-10-CM | POA: Diagnosis present

## 2015-06-15 DIAGNOSIS — R319 Hematuria, unspecified: Secondary | ICD-10-CM | POA: Diagnosis present

## 2015-06-15 DIAGNOSIS — Z8553 Personal history of malignant neoplasm of renal pelvis: Secondary | ICD-10-CM | POA: Diagnosis not present

## 2015-06-15 DIAGNOSIS — Z905 Acquired absence of kidney: Secondary | ICD-10-CM

## 2015-06-15 DIAGNOSIS — R030 Elevated blood-pressure reading, without diagnosis of hypertension: Secondary | ICD-10-CM | POA: Diagnosis present

## 2015-06-15 LAB — CBC
HCT: 44.5 % (ref 39.0–52.0)
Hemoglobin: 14.7 g/dL (ref 13.0–17.0)
MCH: 30 pg (ref 26.0–34.0)
MCHC: 33 g/dL (ref 30.0–36.0)
MCV: 90.8 fL (ref 78.0–100.0)
PLATELETS: 302 10*3/uL (ref 150–400)
RBC: 4.9 MIL/uL (ref 4.22–5.81)
RDW: 13.6 % (ref 11.5–15.5)
WBC: 9.4 10*3/uL (ref 4.0–10.5)

## 2015-06-15 LAB — COMPREHENSIVE METABOLIC PANEL
ALBUMIN: 4.2 g/dL (ref 3.5–5.0)
ALT: 24 U/L (ref 17–63)
ANION GAP: 9 (ref 5–15)
AST: 17 U/L (ref 15–41)
Alkaline Phosphatase: 77 U/L (ref 38–126)
BUN: 14 mg/dL (ref 6–20)
CALCIUM: 9.3 mg/dL (ref 8.9–10.3)
CHLORIDE: 103 mmol/L (ref 101–111)
CO2: 28 mmol/L (ref 22–32)
Creatinine, Ser: 0.98 mg/dL (ref 0.61–1.24)
GFR calc non Af Amer: >60 mL/min (ref >60)
GLUCOSE: 94 mg/dL (ref 65–99)
POTASSIUM: 4.5 mmol/L (ref 3.5–5.1)
SODIUM: 140 mmol/L (ref 135–145)
Total Bilirubin: 0.6 mg/dL (ref 0.3–1.2)
Total Protein: 7.2 g/dL (ref 6.5–8.1)

## 2015-06-15 LAB — SURGICAL PCR SCREEN
MRSA, PCR: NEGATIVE
STAPHYLOCOCCUS AUREUS: NEGATIVE

## 2015-06-15 LAB — PREPARE RBC (CROSSMATCH)

## 2015-06-15 MED ORDER — PIPERACILLIN-TAZOBACTAM 3.375 G IVPB 30 MIN
3.3750 g | INTRAVENOUS | Status: AC
  Administered 2015-06-16: 3.375 g via INTRAVENOUS
  Filled 2015-06-15: qty 50

## 2015-06-15 MED ORDER — PEG 3350-KCL-NA BICARB-NACL 420 G PO SOLR
4000.0000 mL | Freq: Once | ORAL | Status: AC
  Administered 2015-06-15: 4000 mL via ORAL

## 2015-06-15 MED ORDER — KCL IN DEXTROSE-NACL 20-5-0.45 MEQ/L-%-% IV SOLN
INTRAVENOUS | Status: DC
  Administered 2015-06-15: 15:00:00 via INTRAVENOUS
  Filled 2015-06-15 (×3): qty 1000

## 2015-06-15 MED ORDER — OXYCODONE-ACETAMINOPHEN 5-325 MG PO TABS
1.0000 | ORAL_TABLET | Freq: Four times a day (QID) | ORAL | Status: DC | PRN
Start: 2015-06-15 — End: 2015-06-16

## 2015-06-15 NOTE — H&P (Signed)
Timothy Hickman is an 45 y.o. male.    Chief Complaint: Pre-Op Cystoprostatectomy and Left Cutaneous Ureterostomy  HPI:   1 - Right Renal Pelvis Cancer - s/p right robotic nephro-uretrectomy with bladder cuff excision and paracaval lymphadenectomy 05/27/14 for pTaN0Mx large volume urothelial carcinoma. Pre-op staging with several enlarged paracaval lymph nodes that fortunately were negative for cancer spread by surgical pathology.   Recent Surveillance: 03/2015 - CT, CXR no upper tract recurrence, large bladder cancer noted.   2 - Solitary Left Kidney - s/p right nephroureterectomy as per above. Cr's <1.5.   3 - Bladder Cancer -  03/2015 - T1G3, very large volume (8cm2) by TURBT. Muscle present and negative, prostatic urethral biopsies negative, but gross bladder neck involvmeent. CT, CXR clinically localized. Restaging TURBT 6 weeks later with rapidly recurrent T1G3 very large volume. Prostatic urethra biopsies negative.   PMH unremarkable. NO CV disease. No blood thinners.   Today "Timothy Hickman" is seen as pre-op admission before cystoprostatectomy tomorrow. He continues to have hematuria with occastional clots. No fevers.   Past Medical History  Diagnosis Date  . Borderline hypertension   . Bladder cancer ([place])   . Urinary hesitancy   . History of cardiac murmur as a child   . Solitary left kidney   . Chronic kidney disease   . History of renal pelvis cancer urologist-  dr Tresa Moore    dx 01/ 2016--  High grade papillary urothelial carinoma upper ureter and renal pelvis (pTa N0 Mx) w/ negative lymph nodes--  s/p  right nephroureterectomy 05-27-2014    Past Surgical History  Procedure Laterality Date  . Cystoscopy with ureteroscopy Right 05/05/2014    Procedure: CYSTOSCOPY/ RIGHT URETEROSCOPY/ RIGHT RETROGRADE PYLEGRAM WITH INTERPRETATION/ RIGHT URETERAL BALLOON DILATION OF UPJ OBSTRUCTION/ BIOPSY OF RIGHT RENAL PELVIC MASS;  Surgeon: Ailene Rud, MD;  Location: [facility];  Service: Urology;  Laterality: Right;  . Cystoscopy w/ ureteral stent placement Right 05/07/2014    Procedure: CYSTOSCOPY WITH RETROGRADE PYELOGRAM/RIGHT DIAGNOSTIC URETEROSCOPY, RIGHT URETERAL STENT PLACEMENT;  Surgeon: Raynelle Bring, MD;  Location: [facility];  Service: Urology;  Laterality: Right;  . Robot assited laparoscopic nephroureterectomy Right 05/27/2014    Procedure: ROBOT ASSITED LAPAROSCOPIC NEPHROURETERECTOMY;  Surgeon: [name], MD;  Location: [facility];  Service: Urology;  Laterality: Right;  . Transurethral resection of bladder tumor with gyrus (turbt-gyrus) N/A 03/31/2015    Procedure: TRANSURETHRAL RESECTION OF BLADDER TUMOR WITH GYRUS (TURBT-GYRUS);  Surgeon: [name], MD;  Location: [facility];  Service: Urology;  Laterality: N/A;  . Transurethral resection of bladder tumor with gyrus (turbt-gyrus) N/A 05/10/2015    Procedure: TRANSURETHRAL RESECTION OF BLADDER TUMOR WITH GYRUS (TURBT-GYRUS);  Surgeon: [name], MD;  Location: [facility];  Service: Urology;  Laterality: N/A;    Family History  Problem Relation Age of Onset  . Breast cancer Mother   . HIV/AIDS Father    Social History:  reports that he has been smoking Cigarettes.  He has a 10 pack-year smoking history. He has never used smokeless tobacco. He reports that he drinks alcohol. He reports that he does not use illicit drugs.  Allergies:  Allergies  Allergen Reactions  . Valium Swelling    Face and hands    Medications Prior to Admission  Medication Sig Dispense Refill  . oxyCODONE-acetaminophen (ROXICET) 5-325 MG tablet Take 1-2 tablets by mouth every 6 (six) hours as needed for moderate pain or severe pain. Post-operatively (Patient not taking: Reported on  06/02/2015) 30 tablet 0  . senna-docusate (SENOKOT-S) 8.6-50 MG tablet Take 1 tablet by mouth 2 (two) times daily. While taking pain meds to prevent constipation. (Patient not taking: Reported on  06/02/2015) 30 tablet 0    No results found for this or any previous visit (from the past 48 hour(s)). No results found.  Review of Systems  Constitutional: Negative.   HENT: Negative.   Eyes: Negative.   Respiratory: Negative.   Cardiovascular: Negative.   Gastrointestinal: Negative.   Genitourinary: Positive for hematuria.  Musculoskeletal: Negative.   Skin: Negative.   Neurological: Negative.   Endo/Heme/Allergies: Negative.   Psychiatric/Behavioral: Negative.     Blood pressure 148/91, pulse 77, temperature 98.2 F (36.8 C), temperature source Oral, resp. rate 18, height 5\' 7"  (1.702 m), weight 97.478 kg (214 lb 14.4 oz), SpO2 100 %. Physical Exam  Constitutional: He appears well-developed.  Family at bedside.   HENT:  Head: Normocephalic.  Eyes: Pupils are equal, round, and reactive to light.  Neck: Normal range of motion.  Cardiovascular: Normal rate.   Respiratory: Effort normal.  GI: Soft.  Prior scars w/o hernias noted.   Genitourinary: Penis normal.  NO flank pain.   Musculoskeletal: Normal range of motion.  Neurological: He is alert.  Skin: Skin is warm.  Psychiatric: He has a normal mood and affect. His behavior is normal. Judgment and thought content normal.     Assessment/Plan  1 - Recurrent High-Grade Large Volume Bladder Cancer with Solitary Left Kidney - we have discussed management options in detail previously and both agree on cystoprostatectomy with left cutaneous ureterostomy and will proceed as planned tomorrow. Bowel prep (in case ureteral length mandates conduit), CMP, T+C blood, CBC, clears, NPO p MN. Risks, benefits, alternatives discussed in detail again today and he desires to proceed tomorrow as planned.    [name], MD 06/15/2015, 1:26 PM

## 2015-06-15 NOTE — Consult Note (Addendum)
[place] Nurse Consult requested for preoperative stoma site marking by Dr.Theodore Manny.  Request is for bilateral markings. Operative procedure is planned for tomorrow, 06/16/15.  Discussed surgical procedure and stoma creation with patient.  Explained role of the [place] [facility] nurse team.  Provided the patient with educational booklet/DVD and provided samples of pouching options.  Answered patient and questions.   Examined patient lying, sitting, and standing in order to place the marking in the patient's visual field, away from any creases or abdominal contour issues and within the rectus muscle.  Attempted to mark below the patient's belt line, but patient wears the elastic waistband of his underwear and his belted clothing quite low.  Abdomen is cleaned with CHG wipes prior to marking x2.  NO thin, film transparent dressing applied due to presence of hair.  Marked for ileal conduit in the RUQ 7cm to the right of the umbilicus and  [id number] above the umbilicus. Marked for stoma in the LUQ 6cm to the left of the umbilicus and [id number] above.It is noted that the patient has thick hair growth on the chest and abdomen.  Shaving techniques will be included in the post operative teaching.  Patient has signed a consent form for samples and educational materials to be sent to him by [employer] representing [employer].  Thank you for allowing [place] to consult preoperatively on this nice gentleman. [place] team will follow up with patient after surgery for continued ostomy care and teaching.  Doristine Bosworth, MSN, RN, CNS, [place], Arther Abbott Pager # [phone]

## 2015-06-16 ENCOUNTER — Inpatient Hospital Stay (HOSPITAL_COMMUNITY): Payer: BLUE CROSS/BLUE SHIELD | Admitting: Certified Registered Nurse Anesthetist

## 2015-06-16 ENCOUNTER — Encounter (HOSPITAL_COMMUNITY)
Admission: RE | Disposition: A | Discharge status: Home / Self Care | Payer: Self-pay | Source: Ambulatory Visit | Attending: Urology

## 2015-06-16 ENCOUNTER — Encounter (HOSPITAL_COMMUNITY): Payer: Self-pay | Admitting: Certified Registered Nurse Anesthetist

## 2015-06-16 HISTORY — PX: ROBOT ASSISTED LAPAROSCOPIC COMPLETE CYSTECT ILEAL CONDUIT: SHX5139

## 2015-06-16 HISTORY — PX: ROBOT ASSISTED LAPAROSCOPIC RADICAL PROSTATECTOMY: SHX5141

## 2015-06-16 HISTORY — PX: CYSTOSCOPY WITH INJECTION: SHX1424

## 2015-06-16 LAB — BASIC METABOLIC PANEL
Anion gap: 10 (ref 5–15)
BUN: 13 mg/dL (ref 6–20)
CALCIUM: 8.8 mg/dL — AB (ref 8.9–10.3)
CO2: 24 mmol/L (ref 22–32)
CREATININE: 1.11 mg/dL (ref 0.61–1.24)
Chloride: 107 mmol/L (ref 101–111)
GFR calc Af Amer: >60 mL/min (ref >60)
GLUCOSE: 137 mg/dL — AB (ref 65–99)
Potassium: 4.4 mmol/L (ref 3.5–5.1)
SODIUM: 141 mmol/L (ref 135–145)

## 2015-06-16 LAB — HEMOGLOBIN AND HEMATOCRIT, BLOOD
HCT: 43.6 % (ref 39.0–52.0)
HEMOGLOBIN: 14.2 g/dL (ref 13.0–17.0)

## 2015-06-16 SURGERY — CYSTECTOMY, ROBOT-ASSISTED, WITH ILEAL CONDUIT CREATION
Anesthesia: General

## 2015-06-16 MED ORDER — LIDOCAINE HCL (CARDIAC) 20 MG/ML IV SOLN
INTRAVENOUS | Status: DC | PRN
  Administered 2015-06-16: 100 mg via INTRAVENOUS

## 2015-06-16 MED ORDER — PROPOFOL 10 MG/ML IV BOLUS
INTRAVENOUS | Status: DC | PRN
  Administered 2015-06-16: 200 mg via INTRAVENOUS

## 2015-06-16 MED ORDER — HYDRALAZINE HCL 20 MG/ML IJ SOLN
10.0000 mg | Freq: Four times a day (QID) | INTRAMUSCULAR | Status: DC | PRN
  Administered 2015-06-16: 10 mg via INTRAVENOUS
  Filled 2015-06-16: qty 1

## 2015-06-16 MED ORDER — HYDROMORPHONE HCL 1 MG/ML IJ SOLN: 0.2500 mg | INTRAMUSCULAR | Status: DC | PRN

## 2015-06-16 MED ORDER — MIDAZOLAM HCL 5 MG/5ML IJ SOLN
INTRAMUSCULAR | Status: DC | PRN
  Administered 2015-06-16: 2 mg via INTRAVENOUS

## 2015-06-16 MED ORDER — ROCURONIUM BROMIDE 100 MG/10ML IV SOLN
INTRAVENOUS | Status: AC
  Filled 2015-06-16: qty 1

## 2015-06-16 MED ORDER — LACTATED RINGERS IV SOLN: INTRAVENOUS | Status: DC

## 2015-06-16 MED ORDER — DIPHENHYDRAMINE HCL 12.5 MG/5ML PO ELIX
12.5000 mg | ORAL_SOLUTION | Freq: Four times a day (QID) | ORAL | Status: DC | PRN
  Filled 2015-06-16: qty 10

## 2015-06-16 MED ORDER — STERILE WATER FOR IRRIGATION IR SOLN
Status: DC | PRN
  Administered 2015-06-16: 1000 mL

## 2015-06-16 MED ORDER — HYDROMORPHONE HCL 1 MG/ML IJ SOLN
0.5000 mg | INTRAMUSCULAR | Status: DC | PRN
  Administered 2015-06-16: 0.5 mg via INTRAVENOUS
  Filled 2015-06-16: qty 1

## 2015-06-16 MED ORDER — ONDANSETRON HCL 4 MG/2ML IJ SOLN: 4.0000 mg | Freq: Four times a day (QID) | INTRAMUSCULAR | Status: DC | PRN

## 2015-06-16 MED ORDER — KCL IN DEXTROSE-NACL 20-5-0.45 MEQ/L-%-% IV SOLN
INTRAVENOUS | Status: AC
  Filled 2015-06-16: qty 1000

## 2015-06-16 MED ORDER — EPHEDRINE SULFATE 50 MG/ML IJ SOLN
INTRAMUSCULAR | Status: AC
  Filled 2015-06-16: qty 1

## 2015-06-16 MED ORDER — DEXAMETHASONE SODIUM PHOSPHATE 10 MG/ML IJ SOLN
INTRAMUSCULAR | Status: DC | PRN
  Administered 2015-06-16: 10 mg via INTRAVENOUS

## 2015-06-16 MED ORDER — AMLODIPINE BESYLATE 5 MG PO TABS
5.0000 mg | ORAL_TABLET | Freq: Every day | ORAL | Status: DC
  Administered 2015-06-16 – 2015-06-19 (×4): 5 mg via ORAL
  Filled 2015-06-16 (×4): qty 1

## 2015-06-16 MED ORDER — ONDANSETRON HCL 4 MG/2ML IJ SOLN
INTRAMUSCULAR | Status: DC | PRN
  Administered 2015-06-16: 4 mg via INTRAVENOUS

## 2015-06-16 MED ORDER — FENTANYL CITRATE (PF) 100 MCG/2ML IJ SOLN
INTRAMUSCULAR | Status: AC
  Filled 2015-06-16: qty 2

## 2015-06-16 MED ORDER — ONDANSETRON HCL 4 MG/2ML IJ SOLN
4.0000 mg | INTRAMUSCULAR | Status: DC | PRN
  Administered 2015-06-16: 4 mg via INTRAVENOUS
  Filled 2015-06-16: qty 2

## 2015-06-16 MED ORDER — LABETALOL HCL 5 MG/ML IV SOLN
5.0000 mg | INTRAVENOUS | Status: AC | PRN
  Administered 2015-06-16 (×2): 5 mg via INTRAVENOUS

## 2015-06-16 MED ORDER — ROCURONIUM BROMIDE 100 MG/10ML IV SOLN
INTRAVENOUS | Status: DC | PRN
  Administered 2015-06-16: 10 mg via INTRAVENOUS
  Administered 2015-06-16 (×2): 20 mg via INTRAVENOUS
  Administered 2015-06-16: 70 mg via INTRAVENOUS
  Administered 2015-06-16: 20 mg via INTRAVENOUS
  Administered 2015-06-16 (×2): 10 mg via INTRAVENOUS

## 2015-06-16 MED ORDER — VITAMINS A & D EX OINT
TOPICAL_OINTMENT | CUTANEOUS | Status: AC
  Administered 2015-06-16: 22:00:00
  Filled 2015-06-16: qty 5

## 2015-06-16 MED ORDER — SUGAMMADEX SODIUM 200 MG/2ML IV SOLN
INTRAVENOUS | Status: AC
  Filled 2015-06-16: qty 2

## 2015-06-16 MED ORDER — LACTATED RINGERS IR SOLN
Status: DC | PRN
  Administered 2015-06-16: 1

## 2015-06-16 MED ORDER — LACTATED RINGERS IV SOLN
INTRAVENOUS | Status: DC | PRN
  Administered 2015-06-16 (×2): via INTRAVENOUS

## 2015-06-16 MED ORDER — DEXTROSE 5 % IV SOLN
10.0000 mg | INTRAVENOUS | Status: DC | PRN
  Administered 2015-06-16: 80 ug/min via INTRAVENOUS

## 2015-06-16 MED ORDER — ATROPINE SULFATE 0.4 MG/ML IJ SOLN
INTRAMUSCULAR | Status: AC
  Filled 2015-06-16: qty 1

## 2015-06-16 MED ORDER — OXYCODONE HCL 5 MG PO TABS
5.0000 mg | ORAL_TABLET | ORAL | Status: DC | PRN
  Administered 2015-06-17: 15 mg via ORAL
  Administered 2015-06-17: 10 mg via ORAL
  Administered 2015-06-18 (×3): 15 mg via ORAL
  Administered 2015-06-19: 10 mg via ORAL
  Filled 2015-06-16 (×2): qty 3
  Filled 2015-06-16: qty 1
  Filled 2015-06-16 (×2): qty 2
  Filled 2015-06-16 (×2): qty 3

## 2015-06-16 MED ORDER — SUGAMMADEX SODIUM 200 MG/2ML IV SOLN
INTRAVENOUS | Status: DC | PRN
  Administered 2015-06-16: 200 mg via INTRAVENOUS

## 2015-06-16 MED ORDER — HYDROMORPHONE HCL 1 MG/ML IJ SOLN
INTRAMUSCULAR | Status: DC | PRN
  Administered 2015-06-16 (×2): 0.5 mg via INTRAVENOUS
  Administered 2015-06-16: 1 mg via INTRAVENOUS
  Administered 2015-06-16 (×2): 0.5 mg via INTRAVENOUS

## 2015-06-16 MED ORDER — FENTANYL CITRATE (PF) 100 MCG/2ML IJ SOLN
INTRAMUSCULAR | Status: DC | PRN
  Administered 2015-06-16 (×9): 50 ug via INTRAVENOUS

## 2015-06-16 MED ORDER — SODIUM CHLORIDE 0.9 % IJ SOLN
INTRAMUSCULAR | Status: DC | PRN
  Administered 2015-06-16: 20 mL

## 2015-06-16 MED ORDER — BUPIVACAINE LIPOSOME 1.3 % IJ SUSP
20.0000 mL | Freq: Once | INTRAMUSCULAR | Status: AC
  Administered 2015-06-16: 20 mL
  Filled 2015-06-16: qty 20

## 2015-06-16 MED ORDER — ACETAMINOPHEN 500 MG PO TABS
1000.0000 mg | ORAL_TABLET | Freq: Three times a day (TID) | ORAL | Status: DC
  Administered 2015-06-17 – 2015-06-19 (×8): 1000 mg via ORAL
  Filled 2015-06-16 (×8): qty 2

## 2015-06-16 MED ORDER — OXYCODONE HCL 5 MG PO TABS: 5.0000 mg | ORAL_TABLET | ORAL | Status: DC | PRN

## 2015-06-16 MED ORDER — PROPOFOL 10 MG/ML IV BOLUS
INTRAVENOUS | Status: AC
  Filled 2015-06-16: qty 20

## 2015-06-16 MED ORDER — DEXAMETHASONE SODIUM PHOSPHATE 10 MG/ML IJ SOLN
INTRAMUSCULAR | Status: AC
  Filled 2015-06-16: qty 1

## 2015-06-16 MED ORDER — ONDANSETRON HCL 4 MG/2ML IJ SOLN
INTRAMUSCULAR | Status: AC
  Filled 2015-06-16: qty 2

## 2015-06-16 MED ORDER — LABETALOL HCL 5 MG/ML IV SOLN
INTRAVENOUS | Status: AC
  Filled 2015-06-16: qty 4

## 2015-06-16 MED ORDER — LACTATED RINGERS IV SOLN
INTRAVENOUS | Status: DC | PRN
  Administered 2015-06-16: 08:00:00 via INTRAVENOUS

## 2015-06-16 MED ORDER — HYDROMORPHONE HCL 2 MG/ML IJ SOLN
INTRAMUSCULAR | Status: AC
  Filled 2015-06-16: qty 1

## 2015-06-16 MED ORDER — PIPERACILLIN-TAZOBACTAM 3.375 G IVPB
INTRAVENOUS | Status: AC
  Filled 2015-06-16: qty 50

## 2015-06-16 MED ORDER — ACETAMINOPHEN 500 MG PO TABS
1000.0000 mg | ORAL_TABLET | Freq: Four times a day (QID) | ORAL | Status: DC
  Filled 2015-06-16 (×4): qty 2

## 2015-06-16 MED ORDER — MIDAZOLAM HCL 2 MG/2ML IJ SOLN
INTRAMUSCULAR | Status: AC
  Filled 2015-06-16: qty 2

## 2015-06-16 MED ORDER — SODIUM CHLORIDE 0.9 % IJ SOLN
INTRAMUSCULAR | Status: AC
  Filled 2015-06-16: qty 10

## 2015-06-16 MED ORDER — HYDROMORPHONE HCL 1 MG/ML IJ SOLN
1.0000 mg | INTRAMUSCULAR | Status: DC | PRN
  Administered 2015-06-16 – 2015-06-19 (×11): 2 mg via INTRAVENOUS
  Filled 2015-06-16 (×11): qty 2

## 2015-06-16 MED ORDER — SODIUM CHLORIDE 0.9 % IR SOLN
Status: DC | PRN
  Administered 2015-06-16: 1000 mL via INTRAVESICAL

## 2015-06-16 MED ORDER — OXYCODONE HCL 5 MG PO TABS: 5.0000 mg | ORAL_TABLET | Freq: Once | ORAL | Status: DC | PRN

## 2015-06-16 MED ORDER — BUPIVACAINE-EPINEPHRINE (PF) 0.25% -1:200000 IJ SOLN
INTRAMUSCULAR | Status: AC
  Filled 2015-06-16: qty 30

## 2015-06-16 MED ORDER — ALBUTEROL SULFATE HFA 108 (90 BASE) MCG/ACT IN AERS
INHALATION_SPRAY | RESPIRATORY_TRACT | Status: DC | PRN
  Administered 2015-06-16: 5 via RESPIRATORY_TRACT

## 2015-06-16 MED ORDER — DIPHENHYDRAMINE HCL 50 MG/ML IJ SOLN: 12.5000 mg | Freq: Four times a day (QID) | INTRAMUSCULAR | Status: DC | PRN

## 2015-06-16 MED ORDER — FENTANYL CITRATE (PF) 250 MCG/5ML IJ SOLN
INTRAMUSCULAR | Status: AC
  Filled 2015-06-16: qty 5

## 2015-06-16 MED ORDER — SODIUM CHLORIDE 0.9 % IJ SOLN
INTRAMUSCULAR | Status: AC
  Filled 2015-06-16: qty 20

## 2015-06-16 MED ORDER — ALBUTEROL SULFATE HFA 108 (90 BASE) MCG/ACT IN AERS
INHALATION_SPRAY | RESPIRATORY_TRACT | Status: AC
  Filled 2015-06-16: qty 6.7

## 2015-06-16 MED ORDER — DEXTROSE-NACL 5-0.45 % IV SOLN
INTRAVENOUS | Status: DC
  Administered 2015-06-16 – 2015-06-18 (×4): via INTRAVENOUS

## 2015-06-16 MED ORDER — PHENYLEPHRINE HCL 10 MG/ML IJ SOLN
INTRAMUSCULAR | Status: AC
  Filled 2015-06-16: qty 1

## 2015-06-16 MED ORDER — SODIUM CHLORIDE 0.9 % IV BOLUS (SEPSIS): 1000.0000 mL | Freq: Once | INTRAVENOUS | Status: DC

## 2015-06-16 MED ORDER — OXYCODONE HCL 5 MG/5ML PO SOLN
5.0000 mg | Freq: Once | ORAL | Status: DC | PRN
  Filled 2015-06-16: qty 5

## 2015-06-16 MED ORDER — LIDOCAINE HCL (CARDIAC) 20 MG/ML IV SOLN
INTRAVENOUS | Status: AC
  Filled 2015-06-16: qty 5

## 2015-06-16 SURGICAL SUPPLY — 97 items
APPLICATOR COTTON TIP 6IN STRL (MISCELLANEOUS) ×3 IMPLANT
APPLICATOR SURGIFLO ENDO (HEMOSTASIS) IMPLANT
BAG URO CATCHER STRL LF (MISCELLANEOUS) ×3 IMPLANT
BLADE SURG SZ10 CARB STEEL (BLADE) IMPLANT
CABLE HIGH FREQUENCY MONO STRZ (ELECTRODE) ×3 IMPLANT
CATH FOLEY 2WAY SLVR 18FR 30CC (CATHETERS) ×3 IMPLANT
CATH ROBINSON RED A/P 16FR (CATHETERS) ×3 IMPLANT
CATH TIEMANN FOLEY 18FR 5CC (CATHETERS) ×3 IMPLANT
CHLORAPREP W/TINT 26ML (MISCELLANEOUS) ×3 IMPLANT
CLIP LIGATING HEM O LOK PURPLE (MISCELLANEOUS) ×6 IMPLANT
CLIP LIGATING HEMO LOK XL GOLD (MISCELLANEOUS) ×6 IMPLANT
CLIP LIGATING HEMO O LOK GREEN (MISCELLANEOUS) ×3 IMPLANT
CLOTH BEACON ORANGE TIMEOUT ST (SAFETY) ×3 IMPLANT
COVER MAYO STAND STRL (DRAPES) IMPLANT
COVER TIP SHEARS 8 DVNC (MISCELLANEOUS) ×1 IMPLANT
COVER TIP SHEARS 8MM DA VINCI (MISCELLANEOUS) ×2
CUTTER ECHEON FLEX ENDO 45 340 (ENDOMECHANICALS) ×3 IMPLANT
DECANTER SPIKE VIAL GLASS SM (MISCELLANEOUS) ×3 IMPLANT
DRAPE ARM DVNC X/XI (DISPOSABLE) ×4 IMPLANT
DRAPE COLUMN DVNC XI (DISPOSABLE) ×1 IMPLANT
DRAPE DA VINCI XI ARM (DISPOSABLE) ×8
DRAPE DA VINCI XI COLUMN (DISPOSABLE) ×2
DRAPE LAPAROSCOPIC ABDOMINAL (DRAPES) ×3 IMPLANT
DRAPE WARM FLUID 44X44 (DRAPE) ×3 IMPLANT
DRSG TEGADERM 4X4.75 (GAUZE/BANDAGES/DRESSINGS) ×6 IMPLANT
DRSG TEGADERM 6X8 (GAUZE/BANDAGES/DRESSINGS) ×6 IMPLANT
ELECT CAUTERY BLADE 6.4 (BLADE) ×3 IMPLANT
ELECT REM PT RETURN 9FT ADLT (ELECTROSURGICAL) ×3
ELECTRODE REM PT RTRN 9FT ADLT (ELECTROSURGICAL) ×1 IMPLANT
GAUZE SPONGE 2X2 8PLY STRL LF (GAUZE/BANDAGES/DRESSINGS) ×1 IMPLANT
GLOVE BIO SURGEON STRL SZ 6.5 (GLOVE) ×4 IMPLANT
GLOVE BIO SURGEONS STRL SZ 6.5 (GLOVE) ×2
GLOVE BIOGEL M STRL SZ7.5 (GLOVE) ×9 IMPLANT
GOWN STRL REUS W/TWL LRG LVL3 (GOWN DISPOSABLE) ×15 IMPLANT
GOWN STRL REUS W/TWL LRG LVL4 (GOWN DISPOSABLE) ×9 IMPLANT
HOLDER FOLEY CATH W/STRAP (MISCELLANEOUS) ×3 IMPLANT
IRRIG SUCT STRYKERFLOW 2 WTIP (MISCELLANEOUS)
IRRIGATION SUCT STRKRFLW 2 WTP (MISCELLANEOUS) IMPLANT
IV LACTATED RINGERS 1000ML (IV SOLUTION) ×3 IMPLANT
KIT PROCEDURE DA VINCI SI (MISCELLANEOUS) ×2
KIT PROCEDURE DVNC SI (MISCELLANEOUS) ×1 IMPLANT
LIQUID BAND (GAUZE/BANDAGES/DRESSINGS) ×3 IMPLANT
LOOP MINI RED (MISCELLANEOUS) ×3 IMPLANT
LOOP VESSEL MAXI BLUE (MISCELLANEOUS) ×3 IMPLANT
NEEDLE INSUFFLATION 14GA 120MM (NEEDLE) ×3 IMPLANT
PACK CYSTO (CUSTOM PROCEDURE TRAY) ×3 IMPLANT
PACK ROBOT UROLOGY CUSTOM (CUSTOM PROCEDURE TRAY) ×3 IMPLANT
PAD POSITIONING PINK XL (MISCELLANEOUS) IMPLANT
PORT ACCESS TROCAR AIRSEAL 12 (TROCAR) ×1 IMPLANT
PORT ACCESS TROCAR AIRSEAL 5M (TROCAR) ×2
POUCH ENDO CATCH II 15MM (MISCELLANEOUS) ×3 IMPLANT
PUMP PAIN ON-Q (MISCELLANEOUS) ×3 IMPLANT
RELOAD GREEN ECHELON 45 (STAPLE) ×3 IMPLANT
RELOAD STAPLER GREEN 60MM (STAPLE) ×1 IMPLANT
RELOAD STAPLER WHITE 60MM (STAPLE) ×7 IMPLANT
SEAL CANN UNIV 5-8 DVNC XI (MISCELLANEOUS) ×4 IMPLANT
SEAL XI 5MM-8MM UNIVERSAL (MISCELLANEOUS) ×8
SET TRI-LUMEN FLTR TB AIRSEAL (TUBING) ×3 IMPLANT
SET TUBE IRRIG SUCTION NO TIP (IRRIGATION / IRRIGATOR) ×3 IMPLANT
SHEET LAVH (DRAPES) IMPLANT
SOLUTION ELECTROLUBE (MISCELLANEOUS) ×3 IMPLANT
SPONGE GAUZE 2X2 STER 10/PKG (GAUZE/BANDAGES/DRESSINGS) ×2
SPONGE LAP 18X18 X RAY DECT (DISPOSABLE) ×6 IMPLANT
SPONGE LAP 4X18 X RAY DECT (DISPOSABLE) ×3 IMPLANT
STAPLER ECHELON LONG 60 440 (INSTRUMENTS) ×3 IMPLANT
STAPLER RELOAD GREEN 60MM (STAPLE) ×3
STAPLER RELOAD WHITE 60MM (STAPLE) ×21
STENT SET URETHERAL LEFT 7FR (STENTS) ×3 IMPLANT
STENT SET URETHERAL RIGHT 7FR (STENTS) ×3 IMPLANT
SURGIFLO W/THROMBIN 8M KIT (HEMOSTASIS) IMPLANT
SUT CHROMIC 4 0 RB 1X27 (SUTURE) ×3 IMPLANT
SUT ETHILON 3 0 PS 1 (SUTURE) ×3 IMPLANT
SUT MNCRL AB 4-0 PS2 18 (SUTURE) ×9 IMPLANT
SUT PDS AB 0 CTX 36 PDP370T (SUTURE) ×9 IMPLANT
SUT PDS AB 1 CT1 27 (SUTURE) ×9 IMPLANT
SUT SILK 3 0 SH 30 (SUTURE) IMPLANT
SUT SILK 3 0 SH CR/8 (SUTURE) ×3 IMPLANT
SUT VIC AB 2-0 SH 27 (SUTURE) ×2
SUT VIC AB 2-0 SH 27X BRD (SUTURE) ×1 IMPLANT
SUT VIC AB 2-0 UR5 27 (SUTURE) ×12 IMPLANT
SUT VIC AB 3-0 SH 27 (SUTURE) ×2
SUT VIC AB 3-0 SH 27X BRD (SUTURE) IMPLANT
SUT VIC AB 3-0 SH 27XBRD (SUTURE) ×1 IMPLANT
SUT VIC AB 4-0 PS2 18 (SUTURE) ×6 IMPLANT
SUT VIC AB 4-0 RB1 27 (SUTURE) ×12
SUT VIC AB 4-0 RB1 27XBRD (SUTURE) ×6 IMPLANT
SUT VICRYL 0 UR6 27IN ABS (SUTURE) ×6 IMPLANT
SUT VLOC BARB 180 ABS3/0GR12 (SUTURE) ×9
SUTURE VLOC BRB 180 ABS3/0GR12 (SUTURE) ×3 IMPLANT
SYR 27GX1/2 1ML LL SAFETY (SYRINGE) ×3 IMPLANT
SYSTEM UROSTOMY GENTLE TOUCH (WOUND CARE) ×3 IMPLANT
TOWEL OR 17X26 10 PK STRL BLUE (TOWEL DISPOSABLE) ×3 IMPLANT
TOWEL OR NON WOVEN STRL DISP B (DISPOSABLE) ×3 IMPLANT
TROCAR BLADELESS 15MM (ENDOMECHANICALS) ×3 IMPLANT
WATER STERILE IRR 1000ML UROMA (IV SOLUTION) ×3 IMPLANT
WATER STERILE IRR 1500ML POUR (IV SOLUTION) ×6 IMPLANT
YANKAUER SUCT BULB TIP 10FT TU (MISCELLANEOUS) ×3 IMPLANT

## 2015-06-16 NOTE — Progress Notes (Signed)
Day of Surgery  Subjective:  1 -  High Grade Bladder Cancer with Solitary Left Kidney- admitted 3/2 for bowel prep and stomal markging prior to cystoprostatectomy with left cutaneous ureterostomy v. Ileal conduit. Completed bowel prep to clear, Hgb >14, Cr <1.   Today "Timothy Hickman" is without complaints. Stomal marking performed on left and right, bowel prep to clear.   Objective: Vital signs in last 24 hours: Temp:  [98 F (36.7 C)-98.2 F (36.8 C)] 98 F (36.7 C) (03/03 0609) Pulse Rate:  [66-77] 66 (03/03 0609) Resp:  [18] 18 (03/03 0609) BP: (130-148)/(70-91) 132/70 mmHg (03/03 0609) SpO2:  [97 %-100 %] 97 % (03/03 0609) Weight:  [97.478 kg (214 lb 14.4 oz)] 97.478 kg (214 lb 14.4 oz) (03/02 1249) Last BM Date: 06/16/15  Intake/Output from previous day: 03/02 0701 - 03/03 0700 In: 587.5 [I.V.:587.5] Out: -  Intake/Output this shift:    General appearance: alert, cooperative, appears stated age and girlfriedn at bedside Eyes: negative Nose: Nares normal. Septum midline. Mucosa normal. No drainage or sinus tenderness. Throat: lips, mucosa, and tongue normal; teeth and gums normal Neck: supple, symmetrical, trachea midline Back: symmetric, no curvature. ROM normal. No CVA tenderness. Resp: non-labored on room air Cardio: Nl rate GI: soft, non-tender; bowel sounds normal; no masses,  no organomegaly Male genitalia: normal Extremities: extremities normal, atraumatic, no cyanosis or edema Lymph nodes: Cervical, supraclavicular, and axillary nodes normal. Neurologic: Grossly normal Incision/Wound: prior scars on abd w/o hernias.   Lab Results:   Recent Labs  06/15/15 1414  WBC 9.4  HGB 14.7  HCT 44.5  PLT 302   BMET  Recent Labs  06/15/15 1414  NA 140  K 4.5  CL 103  CO2 28  GLUCOSE 94  BUN 14  CREATININE 0.98  CALCIUM 9.3   PT/INR No results for input(s): LABPROT, INR in the last 72 hours. ABG No results for input(s): PHART, HCO3 in the last 72  hours.  Invalid input(s): PCO2, PO2  Studies/Results: No results found.  Anti-infectives: Anti-infectives    Start     Dose/Rate Route Frequency Ordered Stop   06/16/15 0700  piperacillin-tazobactam (ZOSYN) IVPB 3.375 g     3.375 g 100 mL/hr over 30 Minutes Intravenous 30 min pre-op 06/15/15 1325        Assessment/Plan:  1 -  High Grade Bladder Cancer with Solitary Left Kidney- proceed as planned with cysto, cystoprostatectomy, left cutaneous ureteroscomy v. Ileal conduit. He has very good understanding of natural history of disease and rationale of curative intent cystectomy with diversion. Risks, benefits, alternatives discussed again today and he desires to proceed as planned.   [facility], Timothy Hickman 06/16/2015

## 2015-06-16 NOTE — Brief Op Note (Signed)
06/15/2015 - 06/16/2015  1:58 PM  PATIENT:  Timothy Hickman  45 y.o. male  PRE-OPERATIVE DIAGNOSIS:  RECURRENT BLADDER CANCER, SOLITARY KIDNEY  POST-OPERATIVE DIAGNOSIS:  RECURRENT BLADDER CANCER, SOLITARY KIDNEY  PROCEDURE:  Procedure(s): XI ROBOTIC ASSISTED LAPAROSCOPIC COMPLETE CYSTECT WITH LEFT CUTANEOUS URETEROSTOMY EXTENSIVE ADHESIOLYSIS (N/A) XI ROBOTIC ASSISTED LAPAROSCOPIC RADICAL PROSTATECTOMY (N/A) CYSTOSCOPY WITH INJECTION OF INDOCYANINE GREEN DYE (N/A)  SURGEON:  Surgeon(s) and Role:    * [name], MD - Primary  PHYSICIAN ASSISTANT:   ASSISTANTS: Debbrah Alar, PA   ANESTHESIA:   general  EBL:  Total I/O In: 2000 [I.V.:2000] Out: 200 [Blood:200]  BLOOD ADMINISTERED:none  DRAINS: 1- JP to bulb, 2- Left cutaneous ureterostomy to gravity   LOCAL MEDICATIONS USED:  MARCAINE     SPECIMEN:  Source of Specimen:  1 - pelvic lymph nodes, 2- left distal ureteral margin, 3 - cystoprostatectomy  DISPOSITION OF SPECIMEN:  PATHOLOGY  COUNTS:  YES  TOURNIQUET:  * No tourniquets in log *  DICTATION: .Other Dictation: Dictation Number [id number]  PLAN OF CARE: Admit to inpatient   PATIENT DISPOSITION:  PACU - hemodynamically stable.   Delay start of Pharmacological VTE agent (>24hrs) due to surgical blood loss or risk of bleeding: yes

## 2015-06-16 NOTE — Anesthesia Procedure Notes (Signed)
Procedure Name: Intubation Date/Time: 06/16/2015 9:22 AM Performed by: Montel Clock Pre-anesthesia Checklist: Patient identified, Emergency Drugs available, Suction available, Patient being monitored and Timeout performed Patient Re-evaluated:Patient Re-evaluated prior to inductionOxygen Delivery Method: Circle system utilized Preoxygenation: Pre-oxygenation with 100% oxygen Intubation Type: IV induction Ventilation: Mask ventilation without difficulty and Oral airway inserted - appropriate to patient size Laryngoscope Size: Mac and 3 Grade View: Grade I Tube type: Oral Tube size: 7.5 mm Number of attempts: 1 Airway Equipment and Method: Stylet Placement Confirmation: ETT inserted through vocal cords under direct vision,  positive ETCO2 and breath sounds checked- equal and bilateral Secured at: 23 cm Tube secured with: Tape Dental Injury: Teeth and Oropharynx as per pre-operative assessment  Comments: Initially ventilation felt "tight" after intubation but quickly relieved after ventilated with sevoflurane.

## 2015-06-16 NOTE — Discharge Instructions (Signed)

## 2015-06-16 NOTE — Anesthesia Preprocedure Evaluation (Signed)
Anesthesia Evaluation  Patient identified by MRN, date of birth, ID band Patient awake    Reviewed: Allergy & Precautions, H&P , NPO status , Patient's Chart, lab work & pertinent test results  Airway Mallampati: II   Neck ROM: full    Dental   Pulmonary Current Smoker,    breath sounds clear to auscultation       Cardiovascular hypertension,  Rhythm:regular Rate:Normal     Neuro/Psych    GI/Hepatic   Endo/Other    Renal/GU Renal diseaseRight renal CA s/p nephroureterectomy 2016.     Musculoskeletal   Abdominal   Peds  Hematology   Anesthesia Other Findings   Reproductive/Obstetrics                             Anesthesia Physical Anesthesia Plan  ASA: II  Anesthesia Plan: General   Post-op Pain Management:    Induction: Intravenous  Airway Management Planned: Oral ETT  Additional Equipment:   Intra-op Plan:   Post-operative Plan: Extubation in OR  Informed Consent: I have reviewed the patients History and Physical, chart, labs and discussed the procedure including the risks, benefits and alternatives for the proposed anesthesia with the patient or authorized representative who has indicated his/her understanding and acceptance.     Plan Discussed with: CRNA, Anesthesiologist and Surgeon  Anesthesia Plan Comments:         Anesthesia Quick Evaluation

## 2015-06-16 NOTE — Transfer of Care (Signed)
Immediate Anesthesia Transfer of Care Note  Patient: Timothy Hickman  Procedure(s) Performed: Procedure(s): XI ROBOTIC ASSISTED LAPAROSCOPIC COMPLETE CYSTECT WITH LEFT CUTANEOUS URETEROSTOMY EXTENSIVE ADHESIOLYSIS (N/A) XI ROBOTIC ASSISTED LAPAROSCOPIC RADICAL PROSTATECTOMY (N/A) CYSTOSCOPY WITH INJECTION OF INDOCYANINE GREEN DYE (N/A)  Patient Location: PACU  Anesthesia Type:General  Level of Consciousness:  sedated, patient cooperative and responds to stimulation  Airway & Oxygen Therapy:Patient Spontanous Breathing and Patient connected to face mask oxgen  Post-op Assessment:  Report given to PACU RN and Post -op Vital signs reviewed and stable  Post vital signs:  Reviewed and stable  Last Vitals:  Filed Vitals:   06/16/15 0609 06/16/15 1425  BP: 132/70 177/88  Pulse: 66   Temp: 36.7 C   Resp: 18     Complications: No apparent anesthesia complications

## 2015-06-16 NOTE — Anesthesia Postprocedure Evaluation (Signed)
Anesthesia Post Note  Patient: Timothy Hickman  Procedure(s) Performed: Procedure(s) (LRB): XI ROBOTIC ASSISTED LAPAROSCOPIC COMPLETE CYSTECT WITH LEFT CUTANEOUS URETEROSTOMY EXTENSIVE ADHESIOLYSIS (N/A) XI ROBOTIC ASSISTED LAPAROSCOPIC RADICAL PROSTATECTOMY (N/A) CYSTOSCOPY WITH INJECTION OF INDOCYANINE GREEN DYE (N/A)  Patient location during evaluation: PACU Anesthesia Type: General Level of consciousness: awake and alert Pain management: pain level controlled Vital Signs Assessment: post-procedure vital signs reviewed and stable Respiratory status: spontaneous breathing, nonlabored ventilation, respiratory function stable and patient connected to nasal cannula oxygen Cardiovascular status: blood pressure returned to baseline and stable Postop Assessment: no signs of nausea or vomiting Anesthetic complications: no    Last Vitals:  Filed Vitals:   06/16/15 1515 06/16/15 1530  BP: 161/97 148/97  Pulse: 90 90  Temp:    Resp: 14 13    Last Pain:  Filed Vitals:   06/16/15 1533  PainSc: 0-No pain                 Shantrice Rodenberg A

## 2015-06-16 NOTE — Op Note (Signed)
[name], [name] NO.:  [id number]  MEDICAL RECORD NO.:  [id number]  LOCATION:  68                         FACILITY:  [facility]  PHYSICIAN:  Timothy Frock, MD     DATE OF BIRTH:  [birth year]  DATE OF PROCEDURE: 06/16/2015                               OPERATIVE REPORT  DIAGNOSIS:  Rapidly recurrent high-grade bladder cancer.  PROCEDURES: 1. Cystoscopy with injection of indocyanine green dye. 2. Robotic-assisted laparoscopic radical cystoprostatectomy with left     cutaneous ureterostomy and bilateral pelvic lymph node dissection. 3. Extensive laparoscopic adhesiolysis.  ESTIMATED BLOOD LOSS:  200 mL.  COMPLICATION:  None.  SPECIMENS: 1. Left distal ureteral margin negative for carcinoma on frozen     section. 2. Right external iliac lymph nodes. 3. Right obturator lymph nodes. 4. Right common iliac lymph nodes. 5. Left external iliac lymph nodes. 6. Left obturator lymph nodes. 7. Left common iliac lymph nodes, representative sentinel specimens     labeled as such. 8. Cystoprostatectomy.  FINDINGS: 1. Evidence of continued rapidly recurrent bladder cancer with new     multifocal foci of papillary tumor and nodular erythema within the     urinary bladder. 2. Multifocal pelvic sentinel lymph node with sentinel     lymphangiography including nodes in the right iliac fields and left     iliac fields, but not up to the aortic bifurcation.  These were     labeled as such. 3. Moderate adhesions in the right lower quadrant consistent with     known prior right-sided nephroureterectomy.  DRAINS: 1. Jackson-Pratt drain to bulb suction. 2. Left cutaneous ureterostomy to gravity drainage with blue-colored     Bander stent in situ, a 25 cm to the skin.  ASSISTANT:  Timothy Alar, PA.  INDICATION:  Timothy Hickman is a very pleasant 45 year old man who unfortunately has history of recurrent high-grade urothelial carcinoma. His bag was started over a year ago and  he was found on workup of hematuria to have a large right renal pelvis mass.  He underwent right nephroureterectomy and was found to have localized high-grade cancer with negative margins from his right kidney and ureter.  He unfortunately was then lost to follow up, but repaired approximately a year later with recurrent gross hematuria and was found to have a very large bladder tumor that was clinically localized.  He underwent transurethral resection of this, was corroborated T1 high-grade disease. He underwent restaging procedure, which showed rapidly recurrent tumor and again given the multifocality rapid recurrence of the tumor, options were discussed for further management including induction of BCG versus chemotherapy versus clinical trial versus proceeding with cystoprostatectomy with various forms of urinary diversion with the latter being the most definitive in cases of very large volume of recurrent high-grade T1 disease and he wished to proceed.  Informed consent was obtained and placed in the medical record.  Notably, we discussed various forms of urinary diversion and he wished to proceed with the simplest possible form, was given that he only has one kidney with left cutaneous ureterostomy.  PROCEDURE IN DETAIL:  The patient being Timothy Hickman, was verified. Procedure being cystoscopy with indocyanine green dye injection and  robotic cystoprostatectomy with left cutaneous ureterostomy was confirmed.  Procedure was carried out.  Time-out was performed. Intravenous antibiotics were administered.  General endotracheal anesthesia was introduced.  The patient was placed into a low lithotomy position.  He was further fashioned over the operative table using 3- inch tape over foam padding across his supraxiphoid chest.  A test of steep Trendelenburg positioning was performed.  He was found to be suitably positioned.  Next, sterile field was created by first clipper shaving his  entire abdomen and then prepping his penis, perineum and proximal thighs using iodine x3.  Next, cystourethroscopy was performed using a 24-French ejection set cystoscope.  The anterior and posterior urethra were unremarkable.  Inspection of the bladder revealed once again new rapidly recurrent urothelial carcinoma in all quadrants, but no obvious tumor within the prostatic urethra or distal.  Next, approximately 3 mL of indocyanine green dye was injected in 0.5 mL aliquots creating submucosal blebs in the areas of visible tumor. Bladder was drained.  The patient was then completely reprepped and draped of his infra-xiphoid abdomen using chlorhexidine gluconate and his penis, perineum, proximal thighs using iodine x3, and a high-flow, low-pressure pneumoperitoneum was obtained using Veress technique in the supraumbilical midline having passed the aspiration and drop test.  A site was chosen that appeared to be away from prior scars and correlated with review of his CAT scan, this was being in position where no obvious bowel was directly adherent to the abdominal wall.  An 8-mm robotic camera port was placed into this location.  Laparoscopic examination of the peritoneal cavity revealed some moderate adhesions to right lower quadrant as expected, but minimal adhesions in the upper abdomen.  As such, additional ports were then placed as follows:  Right far lateral 10-mm AirSeal assistant port, right paramedian 15-mm assistant port placed at the previous right-sided stomal site marking and a right paramedian robotic port site approximately 4 fingerbreadths inferolateral to the medial assistant port site.  Left paramedian 8-mm robotic port site approximately 4 fingerbreadths lateral to the camera and left far lateral 8-mm robotic port site.  Robot was docked and passed through electronic checks.  Initial attention was directed at adhesiolysis.  There were several dense adhesions between the  rather redundant sigmoid in its epiploic fat and the area of the left pelvis and iliac vessels consistent with prior nephroureterectomy.  Very careful attention was taken to adhesiolysis of these structures allowing greater mobility of the sigmoid and better exposure of the area of the iliacs.  Approximately 45 minutes was spent, dedicated to this laparoscopic adhesiolysis.  Following these maneuvers, there was no obvious bowel injury.  There was visualization of the right pelvic lymph nodes in the iliac fields and the rectum could easily be seen in its plane behind the bladder allowing appropriate antibiotic visualization for the cystectomy portion.  Attention was directed at the left ureteral identification.  Incision was made lateral to the left medial umbilical ligament from the anterior wall towards the area of the iliac vessels and coursing along them, creating a posterior peritoneal incision towards the area of the ureter.  The ureter was encountered as it coursed over the iliac vessels, it was carefully circumferentially mobilized distally to the area of the ureterovesical junction where it was doubly clipped and ligated.  A frozen section was negative for carcinoma.  It was mobilized superiorly for distance approximately 8 cm above the iliac crossing and a tag suture applied to it and placed in the left  lateral abdomen above the iliac vessels for later diversion. Great care was taken to avoid devascularization or dissection onto the ureter directly and this did not occur and appeared to be suitably vascularized following this dissection.  The left lateral bladder was further swept away from the pelvic sidewall towards the endopelvic fascia, which was also carefully swept away from the prostate in a base- to-apex orientation.  Similarly, the right lateral bladder dissection was performed sweeping the bladder away from the pelvic sidewall on the right allowing exposure of the right  iliac vessels.  The right endopelvic fascia was also swept away from the lateral aspect of the prostate.  Next, attention was directed at lymphadenectomy and the pelvis was inspected under near-infrared fluorescence light.  There were numerous lymphatic channel seen coursing from the bladder towards the area of the pelvic lymph node fields with numerous sentinel lymph nodes respectively in the external iliac, obturator and common iliac groups. The area of the aortic bifurcation was also visualized and there were no obvious sentinel lymph nodes within this area.  As such, standard template lymphadenectomy was performed first on the right side of the external iliac group with confines being external iliac artery, vein, pelvic side wall and iliac bifurcation.  Lymphostasis was achieved with cold clips, set aside for permanent pathology.  Similarly, the right obturator group was dissected with confines being obturator nerve, pelvic side wall, external iliac vein.  Lymphostasis was again achieved with cold clips, set aside, labeled as such.  The obturator nerve was inspected following these maneuvers and found to be visibly intact. Similarly, the right common iliac group was dissected in iliac bifurcation superiorly towards the area of the aortic bifurcation.  This was set aside, labeled the right common iliac lymph nodes.  A mirror image lymphadenectomy was performed on the left side of the left common, left external and left obturator groups were respectively.  There were no obvious sentinel lymph nodes within the internal iliac fields; therefore, this group was not dissected.  Next, a posterior peritoneal flap was performed by connecting the two previous lateral posterior peritoneal incisions and dissecting within this flap towards the area of the apex of the prostate.  The vas and seminal vesicles were encountered and kept superiorly towards the cystoprostatectomy specimen.  This exposed  the vascular pedicles of the bladder and prostate, which were then controlled using a sequential vascular load stapler technique towards the area of the apex of the prostate taking exquisite care to avoid any damage to the rectum, which did not occur grossly.  Limited bilateral nerve sparing was performed sitting away the neurovascular tissue away from the prostatic capsule from the midpoint towards the apex on both sides.  Next, anterior bladder attachments were taken down. This exposed the dorsal venous complex, which was controlled using endovascular stapler, taking great care to avoid the membranous urethral transection, which did not occur.  The membranous urethra was then encountered purposely at the apical portion of the prostate where it was transected at its midportion halfway.  The in-situ Foley catheter was delivered to the operative field, doubly clipped and used as a bucket- handle for superior traction and the posterior aspect of the membranous urethra and periurethral tissues was released.  This completely freed up the cystoprostatectomy specimen, which was immediately placed in extra large EndoCatch bag for later retrieval.  Next, digital rectal exam was performed using indicator glove under laparoscopic vision and no evidence of rectal violation was noted.  At this point,  all sponge and needle counts were correct.  There was excellent hemostasis.  The left ureter was again identified and appeared to be suitably vascular and no sufficient lengths.  Such that cutaneous ureterostomy of the left would be warranted.  It did easily stretched towards the contralateral internal ring.  A closed suction drain was brought through the previous right lateral most assistant port site to the area of the peritoneal cavity.  Robot was then undocked.  Specimen was retrieved by extending the previous camera port site for total distance approximately 5 cm removing the cystoprostatectomy  specimen and setting it aside for permanent pathology.  Next, an incision approximately 2 cm in length was made at the previously left-sided mark, potential conduit site down to the level of the fascia, which was then incised for distance of 2 cm, dilated, such that it did easily accommodate 1-1/2 surgeons fingers and the left ureter was very carefully navigated through this towards the area of the skin, it did appear to be of sufficient length.  This was carefully inspected via the extraction site.  No angulation or twisting of the ureter was noted.  Again, it was felt that this was favorable anatomic orientation to proceed with left cutaneous ureterostomy diversion.  As such, a V-incision was made, but the base of the V being approximately 1.5 cm and total length being approximately 2 cm and the ureter was spatulated for distance approximately 1.5 cm and the skin shaped V was placed at the apex of the ureteral dissection with a single 4-0 Vicryl stitch and a blue-colored Bander stent was placed at the ureter 25 cm to the skin.  Copious efflux of urine was seen around into the distal end of the stent.  Ureter-to-skin anastomosis was then performed using three separate running suture lines, one running each up the respective angles of the skin flap of the V and another one on its free end, taking great care to avoid excessive manipulation of the ureter or angulation, which did not occur.  The previous right paramedian 15-mm assistant port site was closed at the level of the fascia using 0 Vicryl.  Extraction site was closed at the level of the fascia using figure-of-eight PDS x4, reapproximated the Scarpa's using running Vicryl.  All incision sites were infiltrated with dilute lyophilized Marcaine and closed at the level of the skin using subcuticular Monocryl followed by Dermabond.  A urostomy appliance was applied and procedure was terminated.  The patient tolerated the procedure well.   There were no immediate periprocedural complications. The patient was taken to the postanesthesia care unit in stable condition with plan for step-down admission overnight.  Notably just after the complete cystoprostatectomy, extirpated portion of the membranous urethra was closed purposely using running V-Loc to prevent bothersome penile drainage.          ______________________________ Timothy Frock, MD     TM/MEDQ  D:  06/16/2015  T:  06/16/2015  Job:  [id number]

## 2015-06-17 LAB — BASIC METABOLIC PANEL
Anion gap: 8 (ref 5–15)
BUN: 12 mg/dL (ref 6–20)
CHLORIDE: 103 mmol/L (ref 101–111)
CO2: 25 mmol/L (ref 22–32)
CREATININE: 0.81 mg/dL (ref 0.61–1.24)
Calcium: 8.7 mg/dL — ABNORMAL LOW (ref 8.9–10.3)
GFR calc Af Amer: >60 mL/min (ref >60)
GFR calc non Af Amer: >60 mL/min (ref >60)
Glucose, Bld: 141 mg/dL — ABNORMAL HIGH (ref 65–99)
Potassium: 4.1 mmol/L (ref 3.5–5.1)
SODIUM: 136 mmol/L (ref 135–145)

## 2015-06-17 LAB — HEMOGLOBIN AND HEMATOCRIT, BLOOD
HCT: 41.1 % (ref 39.0–52.0)
Hemoglobin: 13.6 g/dL (ref 13.0–17.0)

## 2015-06-17 MED ORDER — CLONIDINE HCL 0.1 MG PO TABS
0.1000 mg | ORAL_TABLET | Freq: Two times a day (BID) | ORAL | Status: DC
Start: 2015-06-17 — End: 2015-06-19
  Administered 2015-06-17 – 2015-06-19 (×5): 0.1 mg via ORAL
  Filled 2015-06-17 (×5): qty 1

## 2015-06-17 NOTE — Progress Notes (Signed)
Patient ID: Timothy Hickman, male   DOB: [birth year], 45 y.o.   MRN: [id number] 1 Day Post-Op Subjective: Patient is without complaint. He reports feeling pretty good. He is not having any significant pain. No nausea or vomiting. He has had flatus and has had some liquid stools. He is tolerating clear liquids and has a good appetite and would like to try regular food.  Objective: Vital signs in last 24 hours: Temp:  [98.1 F (36.7 Hickman)-99.1 F (37.3 Hickman)] 98.3 F (36.8 Hickman) (03/04 0425) Pulse Rate:  [80-105] 84 (03/04 0600) Resp:  [10-17] 11 (03/04 0600) BP: (110-177)/(76-107) 131/87 mmHg (03/04 0600) SpO2:  [89 %-100 %] 98 % (03/04 0600) Arterial Line BP: (120-203)/(67-102) 120/67 mmHg (03/04 0600)  Intake/Output from previous day: 03/03 0701 - 03/04 0700 In: 5902.5 [P.O.:240; I.V.:5662.5] Out: 2740 [Urine:2175; Drains:365; Blood:200] Intake/Output this shift:    Past Medical History  Diagnosis Date  . Borderline hypertension   . Bladder cancer (Timothy Hickman)   . Urinary hesitancy   . History of cardiac murmur as a child   . Solitary left kidney   . Chronic kidney disease   . 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 acetaminophen (TYLENOL) tablet 1,000 mg  1,000 mg Oral Q8H Timothy Frock, MD   1,000 mg at 06/17/15 0019  . amLODipine (NORVASC) tablet 5 mg  5 mg Oral Daily Timothy Frock, MD   5 mg at 06/16/15 1812  . dextrose 5 %-0.45 % sodium chloride infusion   Intravenous Continuous Timothy Dancy, PA-Hickman 125 mL/hr at 06/17/15 0700    . diphenhydrAMINE (BENADRYL) injection 12.5-25 mg  12.5-25 mg Intravenous Q6H PRN Timothy Alar, PA-Hickman       Or  . diphenhydrAMINE (BENADRYL) 12.5 MG/5ML elixir 12.5-25 mg  12.5-25  mg Oral Q6H PRN Timothy Alar, PA-Hickman      . hydrALAZINE (APRESOLINE) injection 10 mg  10 mg Intravenous Q6H PRN Timothy Rhodes, MD   10 mg at 06/16/15 1739  . HYDROmorphone (DILAUDID) injection 1-2 mg  1-2 mg Intravenous Q2H PRN Timothy Frock, MD   2 mg at 06/17/15 0645  . ondansetron (ZOFRAN) injection 4 mg  4 mg Intravenous Q4H PRN Timothy Alar, PA-Hickman   4 mg at 06/16/15 1812  . oxyCODONE (Oxy IR/ROXICODONE) immediate release tablet 5-15 mg  5-15 mg Oral Q4H PRN Timothy Frock, MD        Physical Exam:  General: Patient is in no apparent distress Lungs: Normal respiratory effort, chest expands symmetrically. GI: The abdomen is soft and nontender without mass. Positive bowel sounds. Ureterostomy is slightly dusky but does appear viable with a single stent exiting.    Lab Results:  Recent Labs  06/15/15 1414 06/16/15 1428 06/17/15 0525  WBC 9.4  --   --   HGB 14.7 14.2 13.6  HCT 44.5 43.6 41.1   BMET  Recent Labs  06/16/15 1428 06/17/15 0525  NA 141 136  K 4.4 4.1  CL 107 103  CO2 24 25  GLUCOSE 137* 141*  BUN 13 12  CREATININE 1.11 0.81  CALCIUM 8.8* 8.7*   No results for input(s): LABPT, INR in the last 72 hours. No results for input(s): LABURIN in the last 72 hours.  Results for orders placed or performed during the hospital encounter of 06/15/15  Surgical pcr screen     Status: None   Collection Time: 06/15/15  1:13 PM  Result Value Ref Range Status   MRSA, PCR NEGATIVE NEGATIVE Final   Staphylococcus aureus NEGATIVE NEGATIVE Final    Comment:        The Xpert SA Assay (FDA approved for NASAL specimens in patients over 32 years of age), is one component of a comprehensive surveillance program.  Test performance has been validated by [facility] for patients greater than or equal to 3 year old. It is not intended to diagnose infection nor to guide or monitor treatment.     Studies/Results: No results found.  Assessment/Plan: Cystoprostatectomy and  left cutaneous ureterostomy: He seems to be doing quite well. He did have elevation of his blood pressure which has responded to hydralazine. I will switch him to an oral agent. His drain is still putting out a fairly significant amount (100 mL/265 mL). His ureterostomy does appear slightly dusky but viable. He is tolerating clear liquid diet and his bowels are moving and therefore his diet will be advanced.  1. He will be transferred to the floor. 2. Advance diet. 3. Ambulation. 4. DC hydralazine and began clonidine 0.1 mg twice a day.  Timothy Hickman 06/17/2015, 7:54 AM

## 2015-06-18 LAB — BASIC METABOLIC PANEL
ANION GAP: 7 (ref 5–15)
BUN: 9 mg/dL (ref 6–20)
CALCIUM: 8.5 mg/dL — AB (ref 8.9–10.3)
CO2: 26 mmol/L (ref 22–32)
Chloride: 106 mmol/L (ref 101–111)
Creatinine, Ser: 0.95 mg/dL (ref 0.61–1.24)
GFR calc Af Amer: >60 mL/min (ref >60)
GFR calc non Af Amer: >60 mL/min (ref >60)
GLUCOSE: 111 mg/dL — AB (ref 65–99)
POTASSIUM: 3.9 mmol/L (ref 3.5–5.1)
Sodium: 139 mmol/L (ref 135–145)

## 2015-06-18 LAB — HEMOGLOBIN AND HEMATOCRIT, BLOOD
HCT: 38.6 % — ABNORMAL LOW (ref 39.0–52.0)
Hemoglobin: 12.4 g/dL — ABNORMAL LOW (ref 13.0–17.0)

## 2015-06-18 MED ORDER — BISACODYL 10 MG RE SUPP: 10.0000 mg | Freq: Every day | RECTAL | Status: DC | PRN

## 2015-06-18 NOTE — Progress Notes (Signed)
2 Days Post-Op Subjective: Patient reports no complaints. He has been tolerating a regular diet without any nausea or abdominal tenderness. He indicated to me yesterday that he did have some loose stools although I misunderstood him in that he was actually describing his bowel movements just prior to his admission due to his bowel prep. He in fact has not had any bowel movements yet although has had flatus.   Objective: Vital signs in last 24 hours: Temp:  [97.1 F (36.2 C)-98.3 F (36.8 C)] 97.1 F (36.2 C) (03/05 0543) Pulse Rate:  [69-89] 72 (03/05 0543) Resp:  [8-21] 16 (03/05 0543) BP: (109-146)/(73-87) 109/83 mmHg (03/05 0543) SpO2:  [94 %-100 %] 100 % (03/05 0543) Arterial Line BP: (131-145)/(69-99) 145/99 mmHg (03/04 1200) Weight:  [99.2 kg (218 lb 11.1 oz)] 99.2 kg (218 lb 11.1 oz) (03/04 1535)  Intake/Output from previous day: 03/04 0701 - 03/05 0700 In: 1235 [P.O.:360; I.V.:875] Out: 3910 [Urine:3650; Drains:260] Intake/Output this shift:    Physical Exam:  General:alert, cooperative and mild distress   His abdomen is flat, soft and has positive bowel sounds.   His cutaneous ureterostomy has a stent in place. It is dusky in appearance but not necrotic.   Lab Results:  Recent Labs  06/16/15 1428 06/17/15 0525 06/18/15 0447  HGB 14.2 13.6 12.4*  HCT 43.6 41.1 38.6*   BMET  Recent Labs  06/17/15 0525 06/18/15 0447  NA 136 139  K 4.1 3.9  CL 103 106  CO2 25 26  GLUCOSE 141* 111*  BUN 12 9  CREATININE 0.81 0.95  CALCIUM 8.7* 8.5*   No results for input(s): LABPT, INR in the last 72 hours. No results for input(s): LABURIN in the last 72 hours. Results for orders placed or performed during the hospital encounter of 06/15/15  Surgical pcr screen     Status: None   Collection Time: 06/15/15  1:13 PM  Result Value Ref Range Status   MRSA, PCR NEGATIVE NEGATIVE Final   Staphylococcus aureus NEGATIVE NEGATIVE Final    Comment:        The Xpert SA Assay  (FDA approved for NASAL specimens in patients over 37 years of age), is one component of a comprehensive surveillance program.  Test performance has been validated by [facility] for patients greater than or equal to 22 year old. It is not intended to diagnose infection nor to guide or monitor treatment.     Studies/Results: No results found.  Assessment/Plan: Cystoprostatectomy with left cutaneous ureterostomy: He has progressed as anticipated. He is tolerating a diet and has been up ambulating. He is having flatus but has not had a bowel movement yet. I will give him a laxative.  His electrolytes are normal. His hemoglobin has decreased slightly over the past few days but there is no clinical evidence of bleeding.  His drain output remains slightly elevated and he continues to have good urine output.  His cutaneous ureterostomy looks dusky but viable.    Dulcolax suppository  Continued ambulation  Regular diet  Hep-Lock IV  Possible discharge by tomorrow.   LOS: 3 days   Waqas Bruhl C 06/18/2015, 8:30 AM

## 2015-06-19 LAB — TYPE AND SCREEN
ABO/RH(D): O POS
Antibody Screen: NEGATIVE
Unit division: 0
Unit division: 0

## 2015-06-19 LAB — BASIC METABOLIC PANEL
ANION GAP: 8 (ref 5–15)
BUN: 10 mg/dL (ref 6–20)
CALCIUM: 8.6 mg/dL — AB (ref 8.9–10.3)
CO2: 27 mmol/L (ref 22–32)
Chloride: 99 mmol/L — ABNORMAL LOW (ref 101–111)
Creatinine, Ser: 1.04 mg/dL (ref 0.61–1.24)
GFR calc Af Amer: >60 mL/min (ref >60)
GFR calc non Af Amer: >60 mL/min (ref >60)
GLUCOSE: 120 mg/dL — AB (ref 65–99)
POTASSIUM: 3.7 mmol/L (ref 3.5–5.1)
Sodium: 134 mmol/L — ABNORMAL LOW (ref 135–145)

## 2015-06-19 LAB — HEMOGLOBIN AND HEMATOCRIT, BLOOD
HCT: 42.1 % (ref 39.0–52.0)
Hemoglobin: 14 g/dL (ref 13.0–17.0)

## 2015-06-19 LAB — CREATININE, FLUID (PLEURAL, PERITONEAL, JP DRAINAGE): CREAT FL: 1 mg/dL

## 2015-06-19 MED ORDER — OXYCODONE-ACETAMINOPHEN 5-325 MG PO TABS: 1.0000 | ORAL_TABLET | Freq: Four times a day (QID) | ORAL | Status: DC | PRN

## 2015-06-19 MED ORDER — SENNOSIDES-DOCUSATE SODIUM 8.6-50 MG PO TABS: 2.0000 | ORAL_TABLET | Freq: Two times a day (BID) | ORAL | Status: DC

## 2015-06-19 MED ORDER — SULFAMETHOXAZOLE-TRIMETHOPRIM 800-160 MG PO TABS: 1.0000 | ORAL_TABLET | Freq: Two times a day (BID) | ORAL | Status: DC

## 2015-06-19 NOTE — Progress Notes (Deleted)
3 Days Post-Op Subjective: Patient reports no complaints, pain well controlled. He has been tolerating a regular diet without any nausea or abdominal tenderness. He had 2 BMs yesterday.    Objective: Vital signs in last 24 hours: Temp:  [97.7 F (36.5 C)-98.8 F (37.1 C)] 98.3 F (36.8 C) (03/06 0500) Pulse Rate:  [77-79] 77 (03/06 0500) Resp:  [16-20] 20 (03/06 0500) BP: (106-120)/(64-83) 116/70 mmHg (03/06 0500) SpO2:  [95 %-100 %] 100 % (03/06 0500)  Intake/Output from previous day: 03/05 0701 - 03/06 0700 In: 720 [P.O.:720] Out: 2823 [Urine:2600; Drains:222; Stool:1] Intake/Output this shift:    Physical Exam:  General:alert, cooperative and mild distress   His abdomen is flat, soft and has positive bowel sounds.   His cutaneous ureterostomy has a stent in place. With cherry urine in the tube   Lab Results:  Recent Labs  06/17/15 0525 06/18/15 0447 06/19/15 0358  HGB 13.6 12.4* 14.0  HCT 41.1 38.6* 42.1   BMET  Recent Labs  06/18/15 0447 06/19/15 0358  NA 139 134*  K 3.9 3.7  CL 106 99*  CO2 26 27  GLUCOSE 111* 120*  BUN 9 10  CREATININE 0.95 1.04  CALCIUM 8.5* 8.6*   No results for input(s): LABPT, INR in the last 72 hours. No results for input(s): LABURIN in the last 72 hours. Results for orders placed or performed during the hospital encounter of 06/15/15  Surgical pcr screen     Status: None   Collection Time: 06/15/15  1:13 PM  Result Value Ref Range Status   MRSA, PCR NEGATIVE NEGATIVE Final   Staphylococcus aureus NEGATIVE NEGATIVE Final    Comment:        The Xpert SA Assay (FDA approved for NASAL specimens in patients over 87 years of age), is one component of a comprehensive surveillance program.  Test performance has been validated by [facility] for patients greater than or equal to 50 year old. It is not intended to diagnose infection nor to guide or monitor treatment.     Studies/Results: No results  found.  Assessment/Plan: Cystoprostatectomy with left cutaneous ureterostomy: He has progressed as anticipated. He is tolerating a diet and has been up ambulating. He is having bowel movements.  His creatinine is stable and normal.   His drain output remains appropriate for his lymph node dissection and he continues to have good urine output.  His cutaneous ureterostomy looks dusky but viable, he has not changed his ostomy appliance yet    Wound ostomy for appliance change  Continued ambulation  Regular diet  Hep-Lock IV  Possible discharge.   LOS: 4 days   Christell Faith 06/19/2015, 7:30 AM

## 2015-06-19 NOTE — Consult Note (Signed)
WOC ostomy consult note Stoma type/location: LUQ Ureterostomy with stent in place.  Stomal assessment/size: 7/8 " recessed stoma.  Skin creasing at 3 and 9 o'clock to peristomal skin.  Peristomal assessment: Creasing noted.  Will add barrier ring and convex pouch.  Treatment options for stomal/peristomal skin: Barrier ring  Convex pouch Output blood tinged urine Ostomy pouching: 1pc.convex urostomy pouch  Barrier ring Adapter Nighttime drainage when in bed.  Education provided: Pouch change performed.  Patient applied pouch and opened and closed spout. Understands that the gold teardrop means open.  Needs to be open when connected to bedside drainage.  Otherwise, closed and empty when 1/3 full. Will order supplies for discharge home today with [facility] for ongoing teaching.  Enrolled patient in [place] program: Yes Will not follow at this time.  Please re-consult if needed. Anticipate discharge today.  Domenic Moras RN BSN [place] Pager [phone]

## 2015-06-19 NOTE — Discharge Summary (Signed)
Date of admission: 06/15/2015  Date of discharge: 06/19/2015  Admission diagnosis: Bladder cancer  Discharge diagnosis: same  Secondary diagnoses: none  History and Physical: For full details, please see admission history and physical. Briefly, Timothy Hickman is a 45 y.o. year old patient with bladder cancer, see H&P for details.   Hospital Course: The patient underwent a robotic cystectomy with cutaneous ureterostomy of the left ureter on 06/16/15. The patient tolerated the procedure well. His diet was slowly advanced and he was tolerating a regular diet at the time of discharge. Pain was well controlled through out. The patient worked with ostomy RN for his first appliance change and home health was set up to provide further instruction. On POD#2 the patient had 2 bowel movements and the patient was deemed well for discharge on POD#3 after JP removed as Cr same as serum.  Laboratory values:   Recent Labs  06/17/15 0525 06/18/15 0447 06/19/15 0358  HGB 13.6 12.4* 14.0  HCT 41.1 38.6* 42.1    Recent Labs  06/18/15 0447 06/19/15 0358  CREATININE 0.95 1.04    Disposition: Home  Discharge instruction: The patient was instructed to be ambulatory but told to refrain from heavy lifting, strenuous activity, or driving.   Discharge medications:    Medication List    TAKE these medications        oxyCODONE-acetaminophen 5-325 MG tablet  Commonly known as:  ROXICET  Take 1-2 tablets by mouth every 6 (six) hours as needed for moderate pain or severe pain. Post-operatively     senna-docusate 8.6-50 MG tablet  Commonly known as:  Senokot-S  Take 2 tablets by mouth 2 (two) times daily. While taking pain meds to prevent constipation.     sulfamethoxazole-trimethoprim 800-160 MG tablet  Commonly known as:  BACTRIM DS,SEPTRA DS  Take 1 tablet by mouth 2 (two) times daily. Start taking this the day before your stent comes out.        Followup:  Follow-up Information    Follow up  with [name], MD On 07/11/2015.   Specialty:  Urology   Why:  at 3:30   Contact information:   [place] [address] [phone]

## 2015-06-19 NOTE — Care Management Note (Signed)
Case Management Note  Patient Details  Name: Timothy Hickman MRN: [id number] Date of Birth: 01-16-1971  Subjective/Objective:       45 yo admitted with Bladder cancer and had a Ureterostomy.             Action/Plan: From home with family  Expected Discharge Date:   (unknown)               Expected Discharge Plan:  [place]  In-House Referral:     Discharge planning Services  CM Consult  Post Acute Care Choice:  Home Health Choice offered to:  Patient  DME Arranged:    DME Agency:     HH Arranged:  RN [place] Agency:  [place]  Status of Service:     Medicare Important Message Given:    Date Medicare IM Given:    Medicare IM give by:    Date Additional Medicare IM Given:    Additional Medicare Important Message give by:     If discussed at [place] of Stay Meetings, dates discussed:    Additional Comments: This CM met with pt for disposition planning.  Pt states he would like a HHRN to check on him a few times at home with his new urostomy.  Pt offered choice for [facility] services and chose [facility].  [facility] rep contacted for referral.  No other CM needs communicated. Lynnell Catalan, RN 06/19/2015, 12:32 PM

## 2015-06-19 NOTE — Progress Notes (Signed)
Discharge instructions given , patient verbalized understanding, extra urinary bag and leg bag given to patient.

## 2015-07-03 ENCOUNTER — Emergency Department (HOSPITAL_COMMUNITY)
Admission: EM | Admit: 2015-07-03 | Discharge: 2015-07-03 | Disposition: A | Discharge status: Home / Self Care | Payer: Medicaid Other | Attending: Emergency Medicine | Admitting: Emergency Medicine

## 2015-07-03 ENCOUNTER — Encounter (HOSPITAL_COMMUNITY): Payer: Self-pay | Admitting: Emergency Medicine

## 2015-07-03 ENCOUNTER — Emergency Department (HOSPITAL_COMMUNITY): Payer: Medicaid Other

## 2015-07-03 DIAGNOSIS — R1031 Right lower quadrant pain: Secondary | ICD-10-CM | POA: Insufficient documentation

## 2015-07-03 DIAGNOSIS — R109 Unspecified abdominal pain: Secondary | ICD-10-CM

## 2015-07-03 DIAGNOSIS — R011 Cardiac murmur, unspecified: Secondary | ICD-10-CM | POA: Diagnosis not present

## 2015-07-03 DIAGNOSIS — Q602 Renal agenesis, unspecified: Secondary | ICD-10-CM | POA: Diagnosis not present

## 2015-07-03 DIAGNOSIS — Z8553 Personal history of malignant neoplasm of renal pelvis: Secondary | ICD-10-CM | POA: Insufficient documentation

## 2015-07-03 DIAGNOSIS — Z79899 Other long term (current) drug therapy: Secondary | ICD-10-CM | POA: Insufficient documentation

## 2015-07-03 DIAGNOSIS — N189 Chronic kidney disease, unspecified: Secondary | ICD-10-CM | POA: Diagnosis not present

## 2015-07-03 DIAGNOSIS — Z8551 Personal history of malignant neoplasm of bladder: Secondary | ICD-10-CM | POA: Insufficient documentation

## 2015-07-03 DIAGNOSIS — R0602 Shortness of breath: Secondary | ICD-10-CM | POA: Insufficient documentation

## 2015-07-03 DIAGNOSIS — I129 Hypertensive chronic kidney disease with stage 1 through stage 4 chronic kidney disease, or unspecified chronic kidney disease: Secondary | ICD-10-CM | POA: Insufficient documentation

## 2015-07-03 DIAGNOSIS — R1032 Left lower quadrant pain: Secondary | ICD-10-CM | POA: Insufficient documentation

## 2015-07-03 DIAGNOSIS — Z9889 Other specified postprocedural states: Secondary | ICD-10-CM | POA: Insufficient documentation

## 2015-07-03 DIAGNOSIS — F17210 Nicotine dependence, cigarettes, uncomplicated: Secondary | ICD-10-CM | POA: Diagnosis not present

## 2015-07-03 DIAGNOSIS — G8918 Other acute postprocedural pain: Secondary | ICD-10-CM | POA: Insufficient documentation

## 2015-07-03 LAB — URINALYSIS, ROUTINE W REFLEX MICROSCOPIC
BILIRUBIN URINE: NEGATIVE
GLUCOSE, UA: NEGATIVE mg/dL
Ketones, ur: NEGATIVE mg/dL
Nitrite: NEGATIVE
Protein, ur: NEGATIVE mg/dL
SPECIFIC GRAVITY, URINE: 1.014 (ref 1.005–1.030)
pH: 6 (ref 5.0–8.0)

## 2015-07-03 LAB — COMPREHENSIVE METABOLIC PANEL
ALBUMIN: 3.9 g/dL (ref 3.5–5.0)
ALK PHOS: 71 U/L (ref 38–126)
ALT: 26 U/L (ref 17–63)
AST: 17 U/L (ref 15–41)
Anion gap: 10 (ref 5–15)
BUN: 14 mg/dL (ref 6–20)
CALCIUM: 9.2 mg/dL (ref 8.9–10.3)
CHLORIDE: 106 mmol/L (ref 101–111)
CO2: 27 mmol/L (ref 22–32)
CREATININE: 1.05 mg/dL (ref 0.61–1.24)
GFR calc Af Amer: >60 mL/min (ref >60)
GFR calc non Af Amer: >60 mL/min (ref >60)
GLUCOSE: 99 mg/dL (ref 65–99)
Potassium: 4.3 mmol/L (ref 3.5–5.1)
SODIUM: 143 mmol/L (ref 135–145)
Total Bilirubin: 0.6 mg/dL (ref 0.3–1.2)
Total Protein: 7.1 g/dL (ref 6.5–8.1)

## 2015-07-03 LAB — CBC
HCT: 42.1 % (ref 39.0–52.0)
HEMOGLOBIN: 14.1 g/dL (ref 13.0–17.0)
MCH: 30.5 pg (ref 26.0–34.0)
MCHC: 33.5 g/dL (ref 30.0–36.0)
MCV: 90.9 fL (ref 78.0–100.0)
PLATELETS: 501 10*3/uL — AB (ref 150–400)
RBC: 4.63 MIL/uL (ref 4.22–5.81)
RDW: 13.5 % (ref 11.5–15.5)
WBC: 10.6 10*3/uL — ABNORMAL HIGH (ref 4.0–10.5)

## 2015-07-03 LAB — LIPASE, BLOOD: LIPASE: 24 U/L (ref 11–51)

## 2015-07-03 LAB — URINE MICROSCOPIC-ADD ON

## 2015-07-03 LAB — I-STAT CG4 LACTIC ACID, ED: LACTIC ACID, VENOUS: 0.92 mmol/L (ref 0.5–2.0)

## 2015-07-03 MED ORDER — IOPAMIDOL (ISOVUE-300) INJECTION 61%
100.0000 mL | Freq: Once | INTRAVENOUS | Status: AC | PRN
  Administered 2015-07-03: 80 mL via INTRAVENOUS

## 2015-07-03 NOTE — ED Provider Notes (Signed)
CSN: [id number]     Arrival date & time 07/03/15  1400 History   First MD Initiated Contact with Patient 07/03/15 1530     Chief Complaint  Patient presents with  . Post-op Problem  . Abdominal Pain  . Shortness of Breath     (Consider location/radiation/quality/duration/timing/severity/associated sxs/prior Treatment) Patient is a 45 y.o. male presenting with abdominal pain and shortness of breath. The history is provided by the patient (Patient complains of some swelling to his lower abdomen with minimal tenderness. He had his bladder removed 17 days ago).  Abdominal Pain Pain location:  RLQ and LLQ Pain quality: aching   Pain radiates to:  Does not radiate Pain severity:  Moderate Onset quality:  Sudden Timing:  Constant Progression:  Waxing and waning Chronicity:  New Context: not alcohol use   Associated symptoms: no chest pain, no cough, no diarrhea, no fatigue and no hematuria   Shortness of Breath Associated symptoms: abdominal pain   Associated symptoms: no chest pain, no cough, no headaches and no rash     Past Medical History  Diagnosis Date  . Borderline hypertension   . Bladder cancer ([place])   . Urinary hesitancy   . History of cardiac murmur as a child   . Solitary left kidney   . Chronic kidney disease   . 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 Cystoscopy with ureteroscopy Right 05/05/2014    Procedure: CYSTOSCOPY/ RIGHT URETEROSCOPY/ RIGHT RETROGRADE PYLEGRAM WITH INTERPRETATION/ RIGHT URETERAL BALLOON DILATION OF UPJ OBSTRUCTION/ BIOPSY OF RIGHT RENAL PELVIC MASS;  Surgeon: Ailene Rud, MD;  Location: [facility];  Service: Urology;  Laterality: Right;  . Cystoscopy w/ ureteral stent placement Right  05/07/2014    Procedure: CYSTOSCOPY WITH RETROGRADE PYELOGRAM/RIGHT DIAGNOSTIC URETEROSCOPY, RIGHT URETERAL STENT PLACEMENT;  Surgeon: Raynelle Bring, MD;  Location: [facility];  Service: Urology;  Laterality: Right;  . Robot assited laparoscopic nephroureterectomy Right 05/27/2014    Procedure: ROBOT ASSITED LAPAROSCOPIC NEPHROURETERECTOMY;  Surgeon: [name], MD;  Location: [facility];  Service: Urology;  Laterality: Right;  . Transurethral resection of bladder tumor with gyrus (turbt-gyrus) N/A 03/31/2015    Procedure: TRANSURETHRAL RESECTION OF BLADDER TUMOR WITH GYRUS (TURBT-GYRUS);  Surgeon: [name], MD;  Location: [facility];  Service: Urology;  Laterality: N/A;  . Transurethral resection of bladder tumor with gyrus (turbt-gyrus) N/A 05/10/2015    Procedure: TRANSURETHRAL RESECTION OF BLADDER TUMOR WITH GYRUS (TURBT-GYRUS);  Surgeon: [name], MD;  [facility];  Service: Urology;  Laterality: N/A;  . Robot assisted laparoscopic complete cystect ileal conduit N/A 06/16/2015    Procedure: XI ROBOTIC ASSISTED LAPAROSCOPIC COMPLETE CYSTECT WITH LEFT CUTANEOUS URETEROSTOMY EXTENSIVE ADHESIOLYSIS;  Surgeon: [name], MD;  Location: [facility];  Service: Urology;  Laterality: N/A;  . Robot assisted laparoscopic radical prostatectomy N/A 06/16/2015    Procedure: XI ROBOTIC ASSISTED LAPAROSCOPIC RADICAL PROSTATECTOMY;  Surgeon: [name], MD;  Location: [facility];  Service: Urology;  Laterality: N/A;  . Cystoscopy with injection N/A 06/16/2015    Procedure: CYSTOSCOPY WITH INJECTION OF INDOCYANINE GREEN DYE;  Surgeon: [name], MD;  Location: [facility];  Service: Urology;  Laterality: N/A;   Family History  Problem Relation Age of Onset  .  Breast cancer Mother   . HIV/AIDS Father    Social History  Substance Use Topics  . Smoking status: Current Every Day Smoker -- 0.50 packs/day for 20 years    Types: Cigarettes  . Smokeless tobacco: Never Used   . Alcohol Use: 0.0 oz/week     Comment: rare    Review of Systems  Constitutional: Negative for appetite change and fatigue.  HENT: Negative for congestion, ear discharge and sinus pressure.   Eyes: Negative for discharge.  Respiratory: Negative for cough.   Cardiovascular: Negative for chest pain.  Gastrointestinal: Positive for abdominal pain. Negative for diarrhea.  Genitourinary: Negative for frequency and hematuria.  Musculoskeletal: Negative for back pain.  Skin: Negative for rash.  Neurological: Negative for seizures and headaches.  Psychiatric/Behavioral: Negative for hallucinations.      Allergies  Valium  Home Medications   Prior to Admission medications   Medication Sig Start Date End Date Taking? Authorizing Provider  magnesium hydroxide (MILK OF MAGNESIA) 400 MG/5ML suspension Take 30 mLs by mouth daily as needed for mild constipation.   Yes Historical Provider, MD  oxyCODONE-acetaminophen (ROXICET) 5-325 MG tablet Take 1-2 tablets by mouth every 6 (six) hours as needed for moderate pain or severe pain. Post-operatively 06/19/15  Yes Christell Faith, MD  senna-docusate (SENOKOT-S) 8.6-50 MG tablet Take 2 tablets by mouth 2 (two) times daily. While taking pain meds to prevent constipation. 06/19/15  Yes Christell Faith, MD  sulfamethoxazole-trimethoprim (BACTRIM DS,SEPTRA DS) 800-160 MG tablet Take 1 tablet by mouth 2 (two) times daily. Start taking this the day before your stent comes out. Patient not taking: Reported on 07/03/2015 06/19/15   Christell Faith, MD   BP 150/91 mmHg  Pulse 81  Temp(Src) 98.4 F (36.9 C) (Oral)  Resp 16  Ht 5\' 7"  (1.702 m)  Wt 212 lb (96.163 kg)  BMI 33.20 kg/m2  SpO2 97% Physical Exam  Constitutional: He is oriented to person, place, and time. He appears well-developed.  HENT:  Head: Normocephalic.  Eyes: Conjunctivae and EOM are normal. No scleral icterus.  Neck: Neck supple. No thyromegaly present.  Cardiovascular: Normal rate and  regular rhythm.  Exam reveals no gallop and no friction rub.   No murmur heard. Pulmonary/Chest: No stridor. He has no wheezes. He has no rales. He exhibits no tenderness.  Abdominal: He exhibits no distension. There is tenderness. There is no rebound.  Mild tenderness in right and left lower quadrant. Patient has a bag for his bladder  Musculoskeletal: Normal range of motion. He exhibits no edema.  Lymphadenopathy:    He has no cervical adenopathy.  Neurological: He is oriented to person, place, and time. He exhibits normal muscle tone. Coordination normal.  Skin: No rash noted. No erythema.  Psychiatric: He has a normal mood and affect. His behavior is normal.    ED Course  Procedures (including critical care time) Labs Review Labs Reviewed  CBC - Abnormal; Notable for the following:    WBC 10.6 (*)    Platelets 501 (*)    All other components within normal limits  URINALYSIS, ROUTINE W REFLEX MICROSCOPIC (NOT AT [facility]) - Abnormal; Notable for the following:    APPearance TURBID (*)    Hgb urine dipstick LARGE (*)    Leukocytes, UA MODERATE (*)    All other components within normal limits  URINE MICROSCOPIC-ADD ON - Abnormal; Notable for the following:    Squamous Epithelial / LPF 0-5 (*)    Bacteria, UA  FEW (*)    All other components within normal limits  LIPASE, BLOOD  COMPREHENSIVE METABOLIC PANEL  I-STAT CG4 LACTIC ACID, ED    Imaging Review Ct Abdomen Pelvis W Contrast  07/03/2015  CLINICAL DATA:  Status post cystectomy and prostatectomy for bladder cancer 3 weeks ago. Patient reports drainage around the nephrostomy bag and abdominal distention. EXAM: CT ABDOMEN AND PELVIS WITH CONTRAST TECHNIQUE: Multidetector CT imaging of the abdomen and pelvis was performed using the standard protocol following bolus administration of intravenous contrast. CONTRAST:  89mL ISOVUE-300 IOPAMIDOL (ISOVUE-300) INJECTION 61% COMPARISON:  CT 03/22/2015. FINDINGS: Lower chest: Clear lung  bases. No significant pleural or pericardial effusion. Hepatobiliary: The liver is normal in density without focal abnormality. No evidence of gallstones, gallbladder wall thickening or biliary dilatation. Pancreas: Unremarkable. No pancreatic ductal dilatation or surrounding inflammatory changes. Spleen: Normal in size without focal abnormality. Adrenals/Urinary Tract: Status post remote right nephro ureterectomy and adrenalectomy. The left adrenal gland appears normal. Interval cystectomy and placement of a left ureteral stent, extending into a left anterior abdominal wall nephrostomy. No evidence of ureteral obstruction or periureteral fluid collection. The left kidney appears normal. Stomach/Bowel: No evidence of bowel wall thickening, distention or surrounding inflammatory change. The appendix appears normal. Vascular/Lymphatic: There are no enlarged abdominal or pelvic lymph nodes. Mild aortoiliac atherosclerosis. Reproductive: Interval prostatectomy. Other: There is a small amount of ascites within the pelvis, both pericolic gutters and adjacent to the liver and spleen. No worrisome focal fluid collections are identified. There are postsurgical changes within the midline anterior abdominal wall. There is some soft tissue emphysema within the subcutaneous fat of the lower anterior abdominal wall, asymmetric to the left. This extends into both inguinal canals. Musculoskeletal: No acute or significant osseous findings. IMPRESSION: 1. Status post interval cystectomy and prostatectomy without apparent complication. Left ureteral stent extends into a nephrostomy bag and appears patent. 2. Small amount of nonspecific ascites. No focal extraluminal fluid collection identified. 3. Nonspecific soft tissue emphysema within the low anterior abdominal wall, extending into both inguinal regions. 4. Remote right nephroureterectomy and adrenalectomy. Electronically Signed   By: Richardean Sale M.D.   On: 07/03/2015 17:24    I have personally reviewed and evaluated these images and lab results as part of my medical decision-making.   EKG Interpretation None      MDM   Final diagnoses:  Abdominal pain in male    Patient has some subcutaneous air in his inguinal areas bilaterally. Minimal tenderness to abdomen. I spoke with urology who looked at the CT scan and felt like the air is consistent with the surgery he had recently. The patient is to call the urologist tomorrow for quick follow-up    Milton Ferguson, MD 07/03/15 2131

## 2015-07-03 NOTE — ED Notes (Signed)
Aware of need to drink contrast

## 2015-07-03 NOTE — Discharge Instructions (Signed)
Call dr. Tammi Klippel tomorrow for follow up

## 2015-07-03 NOTE — ED Notes (Signed)
Pt reports cystectomy and prostate removal due to cancer per pt, on 03/03. Presents with nephrostomy bag with urine output, pt reports abd distention and leackage around the stoma. Also reports some shortness of breath. Also reports clear penile discharge. Alert and oriented x 4. Pt is in no distress at this time

## 2015-09-20 ENCOUNTER — Emergency Department (HOSPITAL_COMMUNITY): Payer: BLUE CROSS/BLUE SHIELD

## 2015-09-20 ENCOUNTER — Encounter (HOSPITAL_COMMUNITY): Payer: Self-pay | Admitting: Emergency Medicine

## 2015-09-20 ENCOUNTER — Emergency Department (HOSPITAL_COMMUNITY)
Admission: EM | Admit: 2015-09-20 | Discharge: 2015-09-20 | Disposition: A | Discharge status: Home / Self Care | Payer: BLUE CROSS/BLUE SHIELD | Attending: Emergency Medicine | Admitting: Emergency Medicine

## 2015-09-20 DIAGNOSIS — Z8551 Personal history of malignant neoplasm of bladder: Secondary | ICD-10-CM | POA: Insufficient documentation

## 2015-09-20 DIAGNOSIS — J069 Acute upper respiratory infection, unspecified: Secondary | ICD-10-CM | POA: Diagnosis not present

## 2015-09-20 DIAGNOSIS — F17210 Nicotine dependence, cigarettes, uncomplicated: Secondary | ICD-10-CM | POA: Insufficient documentation

## 2015-09-20 DIAGNOSIS — N189 Chronic kidney disease, unspecified: Secondary | ICD-10-CM | POA: Diagnosis not present

## 2015-09-20 DIAGNOSIS — Z85528 Personal history of other malignant neoplasm of kidney: Secondary | ICD-10-CM | POA: Insufficient documentation

## 2015-09-20 DIAGNOSIS — R05 Cough: Secondary | ICD-10-CM | POA: Diagnosis present

## 2015-09-20 MED ORDER — ALBUTEROL SULFATE HFA 108 (90 BASE) MCG/ACT IN AERS
1.0000 | INHALATION_SPRAY | RESPIRATORY_TRACT | Status: DC | PRN
  Administered 2015-09-20: 2 via RESPIRATORY_TRACT
  Filled 2015-09-20: qty 6.7

## 2015-09-20 MED ORDER — IPRATROPIUM-ALBUTEROL 0.5-2.5 (3) MG/3ML IN SOLN
3.0000 mL | Freq: Once | RESPIRATORY_TRACT | Status: AC
  Administered 2015-09-20: 3 mL via RESPIRATORY_TRACT
  Filled 2015-09-20: qty 3

## 2015-09-20 NOTE — Discharge Instructions (Signed)
Upper Respiratory Infection, Adult Most upper respiratory infections (URIs) are a viral infection of the air passages leading to the lungs. A URI affects the nose, throat, and upper air passages. The most common type of URI is nasopharyngitis and is typically referred to as "the common cold." URIs run their course and usually go away on their own. Most of the time, a URI does not require medical attention, but sometimes a bacterial infection in the upper airways can follow a viral infection. This is called a secondary infection. Sinus and middle ear infections are common types of secondary upper respiratory infections. Bacterial pneumonia can also complicate a URI. A URI can worsen asthma and chronic obstructive pulmonary disease (COPD). Sometimes, these complications can require emergency medical care and may be life threatening.  CAUSES Almost all URIs are caused by viruses. A virus is a type of germ and can spread from one person to another.  RISKS FACTORS You may be at risk for a URI if:   You smoke.   You have chronic heart or lung disease.  You have a weakened defense (immune) system.   You are very young or very old.   You have nasal allergies or asthma.  You work in crowded or poorly ventilated areas.  You work in health care facilities or schools. SIGNS AND SYMPTOMS  Symptoms typically develop 2-3 days after you come in contact with a cold virus. Most viral URIs last 7-10 days. However, viral URIs from the influenza virus (flu virus) can last 14-18 days and are typically more severe. Symptoms may include:   Runny or stuffy (congested) nose.   Sneezing.   Cough.   Sore throat.   Headache.   Fatigue.   Fever.   Loss of appetite.   Pain in your forehead, behind your eyes, and over your cheekbones (sinus pain).  Muscle aches.  DIAGNOSIS  Your health care provider may diagnose a URI by:  Physical exam.  Tests to check that your symptoms are not due to  another condition such as:  Strep throat.  Sinusitis.  Pneumonia.  Asthma. TREATMENT  A URI goes away on its own with time. It cannot be cured with medicines, but medicines may be prescribed or recommended to relieve symptoms. Medicines may help:  Reduce your fever.  Reduce your cough.  Relieve nasal congestion. HOME CARE INSTRUCTIONS   Take medicines only as directed by your health care provider.   Gargle warm saltwater or take cough drops to comfort your throat as directed by your health care provider.  Use a warm mist humidifier or inhale steam from a shower to increase air moisture. This may make it easier to breathe.  Drink enough fluid to keep your urine clear or pale yellow.   Eat soups and other clear broths and maintain good nutrition.   Rest as needed.   Return to work when your temperature has returned to normal or as your health care provider advises. You may need to stay home longer to avoid infecting others. You can also use a face mask and careful hand washing to prevent spread of the virus.  Increase the usage of your inhaler if you have asthma.   Do not use any tobacco products, including cigarettes, chewing tobacco, or electronic cigarettes. If you need help quitting, ask your health care provider. PREVENTION  The best way to protect yourself from getting a cold is to practice good hygiene.   Avoid oral or hand contact with people with cold   symptoms.   Wash your hands often if contact occurs.  There is no clear evidence that vitamin C, vitamin E, echinacea, or exercise reduces the chance of developing a cold. However, it is always recommended to get plenty of rest, exercise, and practice good nutrition.  SEEK MEDICAL CARE IF:   You are getting worse rather than better.   Your symptoms are not controlled by medicine.   You have chills.  You have worsening shortness of breath.  You have brown or red mucus.  You have yellow or brown nasal  discharge.  You have pain in your face, especially when you bend forward.  You have a fever.  You have swollen neck glands.  You have pain while swallowing.  You have white areas in the back of your throat. SEEK IMMEDIATE MEDICAL CARE IF:   You have severe or persistent:  Headache.  Ear pain.  Sinus pain.  Chest pain.  You have chronic lung disease and any of the following:  Wheezing.  Prolonged cough.  Coughing up blood.  A change in your usual mucus.  You have a stiff neck.  You have changes in your:  Vision.  Hearing.  Thinking.  Mood. MAKE SURE YOU:   Understand these instructions.  Will watch your condition.  Will get help right away if you are not doing well or get worse.   This information is not intended to replace advice given to you by your health care provider. Make sure you discuss any questions you have with your health care provider.   Document Released: 09/25/2000 Document Revised: 08/16/2014 Document Reviewed: 07/07/2013 Elsevier Interactive Patient Education 2016 [employer]  

## 2015-09-20 NOTE — ED Notes (Signed)
Pt c/o productive cough and nasal congestion x 2 days. Denies N/V/D. Hx kidney cancer.

## 2015-09-20 NOTE — ED Notes (Signed)
Discharge instructions and follow up care reviewed with patient. Patient verbalized understanding. 

## 2015-09-20 NOTE — ED Provider Notes (Signed)
CSN: [id number]     Arrival date & time 09/20/15  [id number] History   First MD Initiated Contact with Patient 09/20/15 [phone]     Chief Complaint  Patient presents with  . Cough  . Nasal Congestion   HPI  Timothy Hickman is a 45 year old male with PMHx of renal pelvis cancer presenting with cough and congestion. Patient reports onset of symptoms was 2 days ago. He complains of a cough productive of thick yellow sputum. Denies hemoptysis. He states when he coughs he has a burning sensation in his central chest. He denies chest pain or chest burning when he is not coughing. He also complains of nasal congestion and rhinorrhea. Rhinorrhea is clear. He complains of sinus pressure and describes it as a "cloudy head" and "like there is no room in my head". He also feels like he is wheezing. He reports a history of wheezing when he gets bronchitis. Denies history of asthma. He denies fevers, chills, dizziness, syncope, neck pain, sore throat, shortness of breath, abdominal pain, nausea or vomiting.  Past Medical History  Diagnosis Date  . Borderline hypertension   . Bladder cancer ([place])   . Urinary hesitancy   . History of cardiac murmur as a child   . Solitary left kidney   . Chronic kidney disease   . 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 Cystoscopy with ureteroscopy Right 05/05/2014    Procedure: CYSTOSCOPY/ RIGHT URETEROSCOPY/ RIGHT RETROGRADE PYLEGRAM WITH INTERPRETATION/ RIGHT URETERAL BALLOON DILATION OF UPJ OBSTRUCTION/ BIOPSY OF RIGHT RENAL PELVIC MASS;  Surgeon: Ailene Rud, MD;  Location: [facility];  Service: Urology;  Laterality: Right;  . Cystoscopy w/ ureteral stent placement Right 05/07/2014    Procedure: CYSTOSCOPY WITH RETROGRADE  PYELOGRAM/RIGHT DIAGNOSTIC URETEROSCOPY, RIGHT URETERAL STENT PLACEMENT;  Surgeon: Raynelle Bring, MD;  Location: [facility];  Service: Urology;  Laterality: Right;  . Robot assited laparoscopic nephroureterectomy Right 05/27/2014    Procedure: ROBOT ASSITED LAPAROSCOPIC NEPHROURETERECTOMY;  Surgeon: [name], MD;  Location: [facility];  Service: Urology;  Laterality: Right;  . Transurethral resection of bladder tumor with gyrus (turbt-gyrus) N/A 03/31/2015    Procedure: TRANSURETHRAL RESECTION OF BLADDER TUMOR WITH GYRUS (TURBT-GYRUS);  Surgeon: [name], MD;  Location: [facility];  Service: Urology;  Laterality: N/A;  . Transurethral resection of bladder tumor with gyrus (turbt-gyrus) N/A 05/10/2015    Procedure: TRANSURETHRAL RESECTION OF BLADDER TUMOR WITH GYRUS (TURBT-GYRUS);  Surgeon: [name], MD;  Location: [facility];  Service: Urology;  Laterality: N/A;  . Robot assisted laparoscopic complete cystect ileal conduit N/A 06/16/2015    Procedure: XI ROBOTIC ASSISTED LAPAROSCOPIC COMPLETE CYSTECT WITH LEFT CUTANEOUS URETEROSTOMY EXTENSIVE ADHESIOLYSIS;  Surgeon: [name], MD;  Location: [facility];  Service: Urology;  Laterality: N/A;  . Robot assisted laparoscopic radical prostatectomy N/A 06/16/2015    Procedure: XI ROBOTIC ASSISTED LAPAROSCOPIC RADICAL PROSTATECTOMY;  Surgeon: [name], MD;  Location: [facility];  Service: Urology;  Laterality: N/A;  . Cystoscopy with injection N/A 06/16/2015    Procedure: CYSTOSCOPY WITH INJECTION OF INDOCYANINE GREEN DYE;  Surgeon: [name], MD;  Location: [facility];  Service: Urology;  Laterality: N/A;   Family History  Problem Relation Age of Onset  . Breast cancer  Mother   . HIV/AIDS Father    Social History  Substance Use Topics  . Smoking status: Current Every Day Smoker -- 0.50 packs/day for 20 years    Types: Cigarettes  . Smokeless tobacco: Never Used  . Alcohol Use: 0.0 oz/week     Comment: rare     Review of Systems  All other systems reviewed and are negative.     Allergies  Valium  Home Medications   Prior to Admission medications   Medication Sig Start Date End Date Taking? Authorizing Provider  DM-Phenylephrine-Acetaminophen (VICKS DAYQUIL COLD & FLU) 10-5-325 MG CAPS Take 2 capsules by mouth every 12 (twelve) hours as needed (cough/cold symptoms).   Yes Historical Provider, MD  oxyCODONE-acetaminophen (ROXICET) 5-325 MG tablet Take 1-2 tablets by mouth every 6 (six) hours as needed for moderate pain or severe pain. Post-operatively Patient not taking: Reported on 09/20/2015 06/19/15   Christell Faith, MD  senna-docusate (SENOKOT-S) 8.6-50 MG tablet Take 2 tablets by mouth 2 (two) times daily. While taking pain meds to prevent constipation. Patient not taking: Reported on 09/20/2015 06/19/15   Christell Faith, MD  sulfamethoxazole-trimethoprim (BACTRIM DS,SEPTRA DS) 800-160 MG tablet Take 1 tablet by mouth 2 (two) times daily. Start taking this the day before your stent comes out. Patient not taking: Reported on 07/03/2015 06/19/15   Christell Faith, MD   BP 128/88 mmHg  Pulse 81  Temp(Src) 99.6 F (37.6 C) (Oral)  Resp 16  Ht 5\' 7"  (1.702 m)  Wt 90.719 kg  BMI 31.32 kg/m2  SpO2 100% Physical Exam  Constitutional: He appears well-developed and well-nourished. No distress.  HENT:  Head: Normocephalic and atraumatic.  Right Ear: Tympanic membrane and ear canal normal.  Left Ear: Tympanic membrane and ear canal normal.  Nose: Mucosal edema present. Right sinus exhibits no maxillary sinus tenderness and no frontal sinus tenderness. Left sinus exhibits no maxillary sinus tenderness and no frontal sinus tenderness.  Mouth/Throat: Uvula is midline and oropharynx is clear and moist. No oropharyngeal exudate.  Eyes: Conjunctivae are normal. Right eye exhibits no discharge. Left eye exhibits no discharge. No scleral icterus.  Neck: Normal range of motion. Neck supple.  Cardiovascular:  Normal rate, regular rhythm and normal heart sounds.   Pulmonary/Chest: Effort normal. No respiratory distress. He has wheezes.  Diffuse expiratory wheezes.  Abdominal: Soft. He exhibits no distension. There is no tenderness.  Musculoskeletal: Normal range of motion.  Neurological: He is alert. Coordination normal.  Skin: Skin is warm and dry.  Psychiatric: He has a normal mood and affect. His behavior is normal.  Nursing note and vitals reviewed.   ED Course  Procedures (including critical care time) Labs Review Labs Reviewed - No data to display  Imaging Review Dg Chest 2 View  09/20/2015  CLINICAL DATA:  Cough, congestion shortness of breath since yesterday. Recent surgery for cancer involving bladder, prostate and lymph nodes. EXAM: CHEST  2 VIEW COMPARISON:  03/14/2015 FINDINGS: Lungs are adequately inflated without consolidation or effusion. Cardiomediastinal silhouette is within normal. There are minimal degenerative changes of the spine. IMPRESSION: No active cardiopulmonary disease. Electronically Signed   By: Marin Olp M.D.   On: 09/20/2015 09:45   I have personally reviewed and evaluated these images and lab results as part of my medical decision-making.   EKG Interpretation None      MDM   Final diagnoses:  URI (upper respiratory infection)   45 year old male presenting with productive cough, nasal congestion and sinus  pressure 2 days. Mildly febrile at 99.6 in ED. No antipyretics this morning. Nasal congestion noted. No sinus tenderness to palpation. No oropharyngeal erythema, edema or exudate. Heart regular rate and rhythm. Diffuse expiratory wheezes noted. Given DuoNeb which significantly improved wheezing. Chest x-ray negative for acute disease. Presentation consistent with an upper respiratory infection. Will discharge with albuterol inhaler to use as needed for wheezing. No antibiotics indicated at this time. Patient is to follow-up with his PCP in the next few  days if symptoms do not improve. I have discussed at-home symptomatic care. Return precautions given in discharge paperwork and discussed with pt at bedside. Pt stable for discharge     Josephina Gip, PA-C 09/20/15 1054  Leo Grosser, MD 09/20/15 [phone]

## 2015-09-23 ENCOUNTER — Encounter (HOSPITAL_COMMUNITY): Payer: Self-pay | Admitting: Emergency Medicine

## 2015-09-23 ENCOUNTER — Emergency Department (HOSPITAL_COMMUNITY)
Admission: EM | Admit: 2015-09-23 | Discharge: 2015-09-23 | Disposition: A | Discharge status: Home / Self Care | Payer: BLUE CROSS/BLUE SHIELD | Attending: Emergency Medicine | Admitting: Emergency Medicine

## 2015-09-23 DIAGNOSIS — Z79899 Other long term (current) drug therapy: Secondary | ICD-10-CM | POA: Diagnosis not present

## 2015-09-23 DIAGNOSIS — Z8551 Personal history of malignant neoplasm of bladder: Secondary | ICD-10-CM | POA: Insufficient documentation

## 2015-09-23 DIAGNOSIS — Z8553 Personal history of malignant neoplasm of renal pelvis: Secondary | ICD-10-CM | POA: Diagnosis not present

## 2015-09-23 DIAGNOSIS — N189 Chronic kidney disease, unspecified: Secondary | ICD-10-CM | POA: Diagnosis not present

## 2015-09-23 DIAGNOSIS — F17210 Nicotine dependence, cigarettes, uncomplicated: Secondary | ICD-10-CM | POA: Diagnosis not present

## 2015-09-23 DIAGNOSIS — R05 Cough: Secondary | ICD-10-CM | POA: Diagnosis present

## 2015-09-23 DIAGNOSIS — J209 Acute bronchitis, unspecified: Secondary | ICD-10-CM | POA: Diagnosis not present

## 2015-09-23 MED ORDER — GUAIFENESIN ER 600 MG PO TB12: 600.0000 mg | ORAL_TABLET | Freq: Two times a day (BID) | ORAL | Status: DC

## 2015-09-23 MED ORDER — IPRATROPIUM-ALBUTEROL 0.5-2.5 (3) MG/3ML IN SOLN
3.0000 mL | Freq: Once | RESPIRATORY_TRACT | Status: AC
  Administered 2015-09-23: 3 mL via RESPIRATORY_TRACT
  Filled 2015-09-23: qty 3

## 2015-09-23 MED ORDER — PREDNISONE 20 MG PO TABS: 60.0000 mg | ORAL_TABLET | Freq: Every day | ORAL | Status: DC

## 2015-09-23 MED ORDER — PREDNISONE 20 MG PO TABS: 60.0000 mg | ORAL_TABLET | Freq: Once | ORAL | Status: DC

## 2015-09-23 MED ORDER — PREDNISONE 20 MG PO TABS
60.0000 mg | ORAL_TABLET | Freq: Once | ORAL | Status: AC
  Administered 2015-09-23: 60 mg via ORAL
  Filled 2015-09-23: qty 3

## 2015-09-23 NOTE — ED Notes (Signed)
Pt states was recently dx with having an "infection in my lungs." They gave me an inhaler but "this is not doing nothing for me." C/o a bad cough that's not getting better, says he can't cough the mucus up. Inspiratory and expiratory wheezing auscultated in all lung fields.

## 2015-09-23 NOTE — Discharge Instructions (Signed)
Upper Respiratory Infection, Adult Most upper respiratory infections (URIs) are a viral infection of the air passages leading to the lungs. A URI affects the nose, throat, and upper air passages. The most common type of URI is nasopharyngitis and is typically referred to as "the common cold." URIs run their course and usually go away on their own. Most of the time, a URI does not require medical attention, but sometimes a bacterial infection in the upper airways can follow a viral infection. This is called a secondary infection. Sinus and middle ear infections are common types of secondary upper respiratory infections. Bacterial pneumonia can also complicate a URI. A URI can worsen asthma and chronic obstructive pulmonary disease (COPD). Sometimes, these complications can require emergency medical care and may be life threatening.  CAUSES Almost all URIs are caused by viruses. A virus is a type of germ and can spread from one person to another.  RISKS FACTORS You may be at risk for a URI if:   You smoke.   You have chronic heart or lung disease.  You have a weakened defense (immune) system.   You are very young or very old.   You have nasal allergies or asthma.  You work in crowded or poorly ventilated areas.  You work in health care facilities or schools. SIGNS AND SYMPTOMS  Symptoms typically develop 2-3 days after you come in contact with a cold virus. Most viral URIs last 7-10 days. However, viral URIs from the influenza virus (flu virus) can last 14-18 days and are typically more severe. Symptoms may include:   Runny or stuffy (congested) nose.   Sneezing.   Cough.   Sore throat.   Headache.   Fatigue.   Fever.   Loss of appetite.   Pain in your forehead, behind your eyes, and over your cheekbones (sinus pain).  Muscle aches.  DIAGNOSIS  Your health care provider may diagnose a URI by:  Physical exam.  Tests to check that your symptoms are not due to  another condition such as:  Strep throat.  Sinusitis.  Pneumonia.  Asthma. TREATMENT  A URI goes away on its own with time. It cannot be cured with medicines, but medicines may be prescribed or recommended to relieve symptoms. Medicines may help:  Reduce your fever.  Reduce your cough.  Relieve nasal congestion. HOME CARE INSTRUCTIONS   Take medicines only as directed by your health care provider.   Gargle warm saltwater or take cough drops to comfort your throat as directed by your health care provider.  Use a warm mist humidifier or inhale steam from a shower to increase air moisture. This may make it easier to breathe.  Drink enough fluid to keep your urine clear or pale yellow.   Eat soups and other clear broths and maintain good nutrition.   Rest as needed.   Return to work when your temperature has returned to normal or as your health care provider advises. You may need to stay home longer to avoid infecting others. You can also use a face mask and careful hand washing to prevent spread of the virus.  Increase the usage of your inhaler if you have asthma.   Do not use any tobacco products, including cigarettes, chewing tobacco, or electronic cigarettes. If you need help quitting, ask your health care provider. PREVENTION  The best way to protect yourself from getting a cold is to practice good hygiene.   Avoid oral or hand contact with people with cold   symptoms.   Wash your hands often if contact occurs.  There is no clear evidence that vitamin C, vitamin E, echinacea, or exercise reduces the chance of developing a cold. However, it is always recommended to get plenty of rest, exercise, and practice good nutrition.  SEEK MEDICAL CARE IF:   You are getting worse rather than better.   Your symptoms are not controlled by medicine.   You have chills.  You have worsening shortness of breath.  You have brown or red mucus.  You have yellow or brown nasal  discharge.  You have pain in your face, especially when you bend forward.  You have a fever.  You have swollen neck glands.  You have pain while swallowing.  You have white areas in the back of your throat. SEEK IMMEDIATE MEDICAL CARE IF:   You have severe or persistent:  Headache.  Ear pain.  Sinus pain.  Chest pain.  You have chronic lung disease and any of the following:  Wheezing.  Prolonged cough.  Coughing up blood.  A change in your usual mucus.  You have a stiff neck.  You have changes in your:  Vision.  Hearing.  Thinking.  Mood. MAKE SURE YOU:   Understand these instructions.  Will watch your condition.  Will get help right away if you are not doing well or get worse.   This information is not intended to replace advice given to you by your health care provider. Make sure you discuss any questions you have with your health care provider.   Document Released: 09/25/2000 Document Revised: 08/16/2014 Document Reviewed: 07/07/2013 Elsevier Interactive Patient Education 2016 [employer]  

## 2015-09-23 NOTE — ED Provider Notes (Signed)
CSN: [id number]     Arrival date & time 09/23/15  0754 History   First MD Initiated Contact with Patient 09/23/15 0809     Chief Complaint  Patient presents with  . Cough    Patient is a 45 y.o. male presenting with cough. The history is provided by the patient.  Cough Cough characteristics:  Productive Sputum characteristics:  Clear Severity:  Moderate Onset quality:  Gradual Duration:  1 week Timing:  Constant Chronicity:  New Context comment:  Cigarette smoker Relieved by:  Nothing Exacerbated by: inhlaer. Patient's had a cough for the past week. He feels like he has a lot of mucous still in his lungs and can't get it out. He also feels like he can't take deep breath. The patient was seen in the emergency room on June 7. He was diagnosed with bronchitis after having a normal chest x-ray. He was given prescriptions for albuterol. The patient has not noticed any improvement.  He had a similar attack about 2 years ago and was given a prescription for some type of inhaler and a Z-Pak. That worked well for him then. He denies any fevers. He is not having any chest pain. No vomiting or diarrhea. Patient is a cigarette smoker  Past Medical History  Diagnosis Date  . Borderline hypertension   . Bladder cancer ([place])   . Urinary hesitancy   . History of cardiac murmur as a child   . Solitary left kidney   . Chronic kidney disease   . 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 Cystoscopy with ureteroscopy Right 05/05/2014    Procedure: CYSTOSCOPY/ RIGHT URETEROSCOPY/ RIGHT RETROGRADE PYLEGRAM WITH INTERPRETATION/ RIGHT URETERAL BALLOON DILATION OF UPJ OBSTRUCTION/ BIOPSY OF RIGHT RENAL PELVIC MASS;  Surgeon: Ailene Rud, MD;  Location: [facility];  Service: Urology;  Laterality: Right;  . Cystoscopy w/ ureteral stent placement Right 05/07/2014    Procedure: CYSTOSCOPY WITH RETROGRADE PYELOGRAM/RIGHT DIAGNOSTIC URETEROSCOPY, RIGHT URETERAL STENT PLACEMENT;  Surgeon: Raynelle Bring, MD;  Location: [facility];  Service: Urology;  Laterality: Right;  . Robot assited laparoscopic nephroureterectomy Right 05/27/2014    Procedure: ROBOT ASSITED LAPAROSCOPIC NEPHROURETERECTOMY;  Surgeon: [name], MD;  Location: [facility];  Service: Urology;  Laterality: Right;  . Transurethral resection of bladder tumor with gyrus (turbt-gyrus) N/A 03/31/2015    Procedure: TRANSURETHRAL RESECTION OF BLADDER TUMOR WITH GYRUS (TURBT-GYRUS);  Surgeon: [name], MD;  Location: [facility];  Service: Urology;  Laterality: N/A;  . Transurethral resection of bladder tumor with gyrus (turbt-gyrus) N/A 05/10/2015    Procedure: TRANSURETHRAL RESECTION OF BLADDER TUMOR WITH GYRUS (TURBT-GYRUS);  Surgeon: [name], MD;  Location: [facility];  Service: Urology;  Laterality: N/A;  . Robot assisted laparoscopic complete cystect ileal conduit N/A 06/16/2015    Procedure: XI ROBOTIC ASSISTED LAPAROSCOPIC COMPLETE CYSTECT WITH LEFT CUTANEOUS URETEROSTOMY EXTENSIVE ADHESIOLYSIS;  Surgeon: [name], MD;  Location: [facility];  Service: Urology;  Laterality: N/A;  . Robot assisted laparoscopic radical prostatectomy N/A 06/16/2015    Procedure: XI ROBOTIC ASSISTED LAPAROSCOPIC RADICAL PROSTATECTOMY;  Surgeon: [name], MD;  Location: [facility];  Service: Urology;  Laterality: N/A;  . Cystoscopy with injection N/A 06/16/2015    Procedure: CYSTOSCOPY WITH  INJECTION OF INDOCYANINE GREEN DYE;  Surgeon: [name], MD;  Location: [facility];  Service: Urology;  Laterality: N/A;   Family History  Problem Relation Age of Onset  . Breast cancer Mother   . HIV/AIDS Father    Social History  Substance Use Topics  . Smoking status: Current  Every Day Smoker -- 0.50 packs/day for 20 years    Types: Cigarettes  . Smokeless tobacco: Never Used  . Alcohol Use: 0.0 oz/week     Comment: rare    Review of Systems  Respiratory: Positive for cough.   All other systems reviewed and are negative.     Allergies  Valium  Home Medications   Prior to Admission medications   Medication Sig Start Date End Date Taking? Authorizing Provider  DM-Phenylephrine-Acetaminophen (VICKS DAYQUIL COLD & FLU) 10-5-325 MG CAPS Take 2 capsules by mouth every 12 (twelve) hours as needed (cough/cold symptoms).    Historical Provider, MD  guaiFENesin (MUCINEX) 600 MG 12 hr tablet Take 1 tablet (600 mg total) by mouth 2 (two) times daily. 09/23/15   Dorie Rank, MD  oxyCODONE-acetaminophen (ROXICET) 5-325 MG tablet Take 1-2 tablets by mouth every 6 (six) hours as needed for moderate pain or severe pain. Post-operatively Patient not taking: Reported on 09/20/2015 06/19/15   Christell Faith, MD  predniSONE (DELTASONE) 20 MG tablet Take 3 tablets (60 mg total) by mouth daily. 09/23/15   Dorie Rank, MD  senna-docusate (SENOKOT-S) 8.6-50 MG tablet Take 2 tablets by mouth 2 (two) times daily. While taking pain meds to prevent constipation. Patient not taking: Reported on 09/20/2015 06/19/15   Christell Faith, MD  sulfamethoxazole-trimethoprim (BACTRIM DS,SEPTRA DS) 800-160 MG tablet Take 1 tablet by mouth 2 (two) times daily. Start taking this the day before your stent comes out. Patient not taking: Reported on 07/03/2015 06/19/15   Christell Faith, MD   BP 130/98 mmHg  Pulse 99  Temp(Src) 98.2 F (36.8 C) (Oral)  Resp 17  SpO2 96% Physical Exam  Constitutional: He appears well-developed and well-nourished. No distress.  HENT:  Head: Normocephalic and atraumatic.  Right Ear: External ear normal.  Left Ear: External ear normal.  Eyes: Conjunctivae are normal. Right eye exhibits no discharge. Left eye exhibits no discharge. No scleral icterus.  Neck: Neck supple. No  tracheal deviation present.  Cardiovascular: Normal rate, regular rhythm and intact distal pulses.   Pulmonary/Chest: Effort normal. No stridor. No respiratory distress. He has wheezes. He has no rales.  Able to speak in full sentences, no accessory muscle use  Abdominal: Soft. Bowel sounds are normal. He exhibits no distension. There is no tenderness. There is no rebound and no guarding.  Musculoskeletal: He exhibits no edema or tenderness.  Neurological: He is alert. He has normal strength. No cranial nerve deficit (no facial droop, extraocular movements intact, no slurred speech) or sensory deficit. He exhibits normal muscle tone. He displays no seizure activity. Coordination normal.  Skin: Skin is warm and dry. No rash noted.  Psychiatric: He has a normal mood and affect.  Nursing note and vitals reviewed.   ED Course  Procedures (including critical care time)   MDM   Final diagnoses:  Bronchitis with bronchospasm    Patient has some mild wheezing on my exam. I reviewed the chest x-ray results from June 7. There was no evidence of pneumonia. Patient is afebrile here in no distress. I suspect his symptoms are related to a persistent bronchitis associated with cigarette smoking. I do  not think that he requires antibiotics at this point.  I think he would benefit from a course of steroids. I will give him a prescription for guaifenesin as well as prednisone.  I reviewed his evaluation back in 2015. According to the medical records he was given a prescription for an albuterol inhaler which is what he received in the ED 3 days ago.  I will have him continue the albuterol PRN.  I stressed smoking cessation and follow up with a primary care doctor.   Dorie Rank, MD 09/23/15 [phone]

## 2015-11-24 ENCOUNTER — Encounter (HOSPITAL_COMMUNITY): Payer: Self-pay | Admitting: Emergency Medicine

## 2015-11-24 ENCOUNTER — Emergency Department (HOSPITAL_COMMUNITY)
Admission: EM | Admit: 2015-11-24 | Discharge: 2015-11-24 | Disposition: A | Discharge status: Home / Self Care | Payer: BLUE CROSS/BLUE SHIELD | Attending: Emergency Medicine | Admitting: Emergency Medicine

## 2015-11-24 DIAGNOSIS — F17210 Nicotine dependence, cigarettes, uncomplicated: Secondary | ICD-10-CM | POA: Insufficient documentation

## 2015-11-24 DIAGNOSIS — Z7952 Long term (current) use of systemic steroids: Secondary | ICD-10-CM | POA: Insufficient documentation

## 2015-11-24 DIAGNOSIS — N189 Chronic kidney disease, unspecified: Secondary | ICD-10-CM | POA: Diagnosis not present

## 2015-11-24 DIAGNOSIS — B379 Candidiasis, unspecified: Secondary | ICD-10-CM | POA: Diagnosis not present

## 2015-11-24 DIAGNOSIS — Z8551 Personal history of malignant neoplasm of bladder: Secondary | ICD-10-CM | POA: Diagnosis not present

## 2015-11-24 DIAGNOSIS — Z79899 Other long term (current) drug therapy: Secondary | ICD-10-CM | POA: Diagnosis not present

## 2015-11-24 DIAGNOSIS — B372 Candidiasis of skin and nail: Secondary | ICD-10-CM

## 2015-11-24 DIAGNOSIS — L299 Pruritus, unspecified: Secondary | ICD-10-CM | POA: Diagnosis present

## 2015-11-24 DIAGNOSIS — N99528 Other complication of other external stoma of urinary tract: Secondary | ICD-10-CM | POA: Insufficient documentation

## 2015-11-24 DIAGNOSIS — I129 Hypertensive chronic kidney disease with stage 1 through stage 4 chronic kidney disease, or unspecified chronic kidney disease: Secondary | ICD-10-CM | POA: Diagnosis not present

## 2015-11-24 DIAGNOSIS — L309 Dermatitis, unspecified: Secondary | ICD-10-CM

## 2015-11-24 LAB — URINALYSIS, ROUTINE W REFLEX MICROSCOPIC
Bilirubin Urine: NEGATIVE
Glucose, UA: NEGATIVE mg/dL
Ketones, ur: NEGATIVE mg/dL
Nitrite: POSITIVE — AB
Protein, ur: 30 mg/dL — AB
Specific Gravity, Urine: 1.023 (ref 1.005–1.030)
pH: 6 (ref 5.0–8.0)

## 2015-11-24 LAB — URINE MICROSCOPIC-ADD ON

## 2015-11-24 MED ORDER — NYSTATIN 100000 UNIT/GM EX CREA: 1.0000 "application " | TOPICAL_CREAM | Freq: Four times a day (QID) | CUTANEOUS | 0 refills | Status: DC

## 2015-11-24 MED ORDER — FLUCONAZOLE 150 MG PO TABS: 150.0000 mg | ORAL_TABLET | Freq: Once | ORAL | 0 refills | Status: AC

## 2015-11-24 MED ORDER — FLUCONAZOLE 150 MG PO TABS
150.0000 mg | ORAL_TABLET | Freq: Once | ORAL | Status: AC
  Administered 2015-11-24: 150 mg via ORAL
  Filled 2015-11-24: qty 1

## 2015-11-24 NOTE — ED Provider Notes (Signed)
[facility] DEPT Provider Note   CSN: [id number] Arrival date & time: 11/24/15  1357  First Provider Contact:  First MD Initiated Contact with Patient 11/24/15 1411        History   Chief Complaint Chief Complaint  Patient presents with  . Other    Stoma Pain    HPI Timothy Hickman is a 45 y.o. male with a PMHx of HTN, CKD, and bladder cancer s/p nephroureterectomy (05/27/14), radical prostatectomy and complete cystectomy with L cutaneous ureterostomy (06/16/15), who presents to the ED with complaints of mild pain to the area around his ureterostomy stoma that began 2 days ago. He describes the pain is 1/10 intermittent burning and itching pain that is nonradiating worse with movement and with no treatments tried prior to arrival. Associated symptoms include some white material noted to the skin around the stoma, and he has not tried anything for this either. He was concerned that it may be developing some sort of infection so he came to the hospital for evaluation. He denies any recent changes in his stoma materials or the adhesive that is used for his stoma bag. He denies any changes in soaps, detergents, or lotions. Denies any fevers, chills, chest pain, shortness breath, abdominal pain, nausea, vomiting, diarrhea, constipation, hematuria, malodorous urine, numbness, tingling, focal weakness, flank pain, redness, or warmth to the stoma site. Urologist is Dr. Tresa Moore. States the stoma occasionally bleeds which he was told was normal for 6-12 months until it's fully healed.    The history is provided by the patient and medical records. No language interpreter was used.    Past Medical History:  Diagnosis Date  . Bladder cancer ([place])   . Borderline hypertension   . Chronic kidney disease   . History of cardiac murmur as a child   . 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 Solitary left kidney   . Urinary hesitancy     Patient Active Problem List   Diagnosis Date Noted  . Bladder cancer ([place]) 03/31/2015  . Perinephric hematoma 05/06/2014  . Leukocytosis 05/06/2014  . Accelerated hypertension 05/06/2014  . Right flank pain 05/06/2014  . Renal mass     Past Surgical History:  Procedure Laterality Date  . CYSTOSCOPY W/ URETERAL STENT PLACEMENT Right 05/07/2014   Procedure: CYSTOSCOPY WITH RETROGRADE PYELOGRAM/RIGHT DIAGNOSTIC URETEROSCOPY, RIGHT URETERAL STENT PLACEMENT;  Surgeon: Raynelle Bring, MD;  Location: [facility];  Service: Urology;  Laterality: Right;  . CYSTOSCOPY WITH INJECTION N/A 06/16/2015   Procedure: CYSTOSCOPY WITH INJECTION OF INDOCYANINE GREEN DYE;  Surgeon: [name], MD;  Location: [facility];  Service: Urology;  Laterality: N/A;  . CYSTOSCOPY WITH URETEROSCOPY Right 05/05/2014   Procedure: CYSTOSCOPY/ RIGHT URETEROSCOPY/ RIGHT RETROGRADE PYLEGRAM WITH INTERPRETATION/ RIGHT URETERAL BALLOON DILATION OF UPJ OBSTRUCTION/ BIOPSY OF RIGHT RENAL PELVIC MASS;  Surgeon: Ailene Rud, MD;  Location: [facility];  Service: Urology;  Laterality: Right;  . ROBOT ASSISTED LAPAROSCOPIC COMPLETE CYSTECT ILEAL CONDUIT N/A 06/16/2015   Procedure: XI ROBOTIC ASSISTED LAPAROSCOPIC COMPLETE CYSTECT WITH LEFT CUTANEOUS URETEROSTOMY EXTENSIVE ADHESIOLYSIS;  Surgeon: [name], MD;  Location: [facility];  Service: Urology;  Laterality: N/A;  . ROBOT ASSISTED LAPAROSCOPIC RADICAL PROSTATECTOMY N/A 06/16/2015   Procedure: XI ROBOTIC ASSISTED LAPAROSCOPIC RADICAL PROSTATECTOMY;  Surgeon: [name], MD;  Location: [facility];  Service: Urology;  Laterality: N/A;  . ROBOT ASSITED LAPAROSCOPIC NEPHROURETERECTOMY Right 05/27/2014   Procedure: ROBOT ASSITED LAPAROSCOPIC NEPHROURETERECTOMY;  Surgeon: [name], MD;  Location: [facility];  Service: Urology;  Laterality: Right;  .  TRANSURETHRAL RESECTION OF BLADDER TUMOR WITH GYRUS (TURBT-GYRUS) N/A 03/31/2015   Procedure: TRANSURETHRAL RESECTION OF BLADDER TUMOR WITH GYRUS (TURBT-GYRUS);  Surgeon: [name], MD;  Location: [facility];  Service: Urology;  Laterality: N/A;  . TRANSURETHRAL RESECTION OF BLADDER TUMOR WITH GYRUS (TURBT-GYRUS) N/A 05/10/2015   Procedure: TRANSURETHRAL RESECTION OF BLADDER TUMOR WITH GYRUS (TURBT-GYRUS);  Surgeon: [name], MD;  Location: [facility];  Service: Urology;  Laterality: N/A;       Home Medications    Prior to Admission medications   Medication Sig Start Date End Date Taking? Authorizing Provider  DM-Phenylephrine-Acetaminophen (VICKS DAYQUIL COLD & FLU) 10-5-325 MG CAPS Take 2 capsules by mouth every 12 (twelve) hours as needed (cough/cold symptoms).    Historical Provider, MD  guaiFENesin (MUCINEX) 600 MG 12 hr tablet Take 1 tablet (600 mg total) by mouth 2 (two) times daily. 09/23/15   Dorie Rank, MD  oxyCODONE-acetaminophen (ROXICET) 5-325 MG tablet Take 1-2 tablets by mouth every 6 (six) hours as needed for moderate pain or severe pain. Post-operatively Patient not taking: Reported on 09/20/2015 06/19/15   Christell Faith, MD  predniSONE (DELTASONE) 20 MG tablet Take 3 tablets (60 mg total) by mouth daily. 09/23/15   Dorie Rank, MD  senna-docusate (SENOKOT-S) 8.6-50 MG tablet Take 2 tablets by mouth 2 (two) times daily. While taking pain meds to prevent constipation. Patient not taking: Reported on 09/20/2015 06/19/15   Christell Faith, MD  sulfamethoxazole-trimethoprim (BACTRIM DS,SEPTRA DS) 800-160 MG tablet Take 1 tablet by mouth 2 (two) times daily. Start taking this the day before your stent comes out. Patient not taking: Reported on 07/03/2015 06/19/15   Christell Faith, MD    Family History Family History  Problem Relation Age of Onset  . Breast cancer Mother   . HIV/AIDS Father     Social History Social History  Substance Use Topics    . Smoking status: Current Every Day Smoker    Packs/day: 0.50    Years: 20.00    Types: Cigarettes  . Smokeless tobacco: Never Used  . Alcohol use 0.0 oz/week     Comment: rare     Allergies   Valium   Review of Systems Review of Systems  Constitutional: Negative for chills and fever.  Respiratory: Negative for shortness of breath.   Cardiovascular: Negative for chest pain.  Gastrointestinal: Negative for abdominal pain, constipation, diarrhea, nausea and vomiting.  Genitourinary: Negative for flank pain and hematuria.       No malodorous urine +pain/itching/white drainage to skin around ureterostomy site  Musculoskeletal: Negative for arthralgias and myalgias.  Skin: Positive for wound (ureterostomy site pain/itching).  Allergic/Immunologic: Negative for immunocompromised state.  Neurological: Negative for weakness and numbness.  Psychiatric/Behavioral: Negative for confusion.   10 Systems reviewed and are negative for acute change except as noted in the HPI.   Physical Exam Updated Vital Signs BP 138/86 (BP Location: Left Arm)   Pulse 81   Temp 98.7 F (37.1 C) (Oral)   Resp 15   Ht 5\' 7"  (1.702 m)   Wt 89.4 kg   SpO2 98%   BMI 30.85 kg/m   Physical Exam  Constitutional: He is oriented to person, place, and time. Vital signs are normal.  He appears well-developed and well-nourished.  Non-toxic appearance. No distress.  Afebrile, nontoxic, NAD  HENT:  Head: Normocephalic and atraumatic.  Mouth/Throat: Oropharynx is clear and moist and mucous membranes are normal.  Eyes: Conjunctivae and EOM are normal. Right eye exhibits no discharge. Left eye exhibits no discharge.  Neck: Normal range of motion. Neck supple.  Cardiovascular: Normal rate, regular rhythm, normal heart sounds and intact distal pulses.  Exam reveals no gallop and no friction rub.   No murmur heard. Pulmonary/Chest: Effort normal and breath sounds normal. No respiratory distress. He has no  decreased breath sounds. He has no wheezes. He has no rhonchi. He has no rales.  Abdominal: Soft. Normal appearance and bowel sounds are normal. He exhibits no distension. There is no tenderness. There is no rigidity, no rebound, no guarding, no CVA tenderness, no tenderness at McBurney's point and negative Murphy's sign.    Soft, NTND, +BS throughout, no r/g/r, neg murphy's, neg mcburney's, no CVA TTP   LLQ with cutaneous ureterostomy stoma intact, surrounding the ostomy the skin has some white plaques that easily scrape off and reveal a mildly erythematous base (SEE PICTURE BELOW). Very minimally tender to the skin where the white plaques are found, but no other areas of tenderness or warmth, no surrounding cellulitis. Stoma bag adhesive also noted, which easily comes off.   Musculoskeletal: Normal range of motion.  Neurological: He is alert and oriented to person, place, and time. He has normal strength. No sensory deficit.  Skin: Skin is warm, dry and intact. No rash noted.  SEE ABD EXAM  Psychiatric: He has a normal mood and affect.  Nursing note and vitals reviewed.      ED Treatments / Results  Labs (all labs ordered are listed, but only abnormal results are displayed) Labs Reviewed  URINALYSIS, ROUTINE W REFLEX MICROSCOPIC (NOT AT [facility]) - Abnormal; Notable for the following:       Result Value   APPearance CLOUDY (*)    Hgb urine dipstick MODERATE (*)    Protein, ur 30 (*)    Nitrite POSITIVE (*)    Leukocytes, UA SMALL (*)    All other components within normal limits  URINE MICROSCOPIC-ADD ON - Abnormal; Notable for the following:    Squamous Epithelial / LPF 0-5 (*)    Bacteria, UA RARE (*)    All other components within normal limits  URINE CULTURE    EKG  EKG Interpretation None       Radiology No results found.  Procedures Procedures (including critical care time)  Medications Ordered in ED Medications  fluconazole (DIFLUCAN) tablet 150 mg (150 mg Oral  Given 11/24/15 1516)     Initial Impression / Assessment and Plan / ED Course  I have reviewed the triage vital signs and the nursing notes.  Pertinent labs & imaging results that were available during my care of the patient were reviewed by me and considered in my medical decision making (see chart for details).  Clinical Course    45 y.o. male here with minimal tenderness around ureterostomy stoma site, some white material on the skin around the stoma. No other symptoms, no abdominal tenderness. Skin around stoma minimally irritated with white material that scrapes off and reveals mildly erythematous base c/w yeast. Will give fluconazole here and one more to go home with to take in 3 days, then nystatin cream, and will get U/A today to ensure no urinary issues. Will reassess after U/A results   4:18 PM  U/A with +nitrites but 0-5 squamous and WBC, 6-30 RBCs and rare bacteria. Doubt UTI. Sent for culture, likely just from colonization, but will await tx until culture results. Send home with diflucan to take in 3 days and nystatin cream. Discussed keeping skin clean and as dry as possible, f/up with Dr. Tresa Moore in 3-4 days. Strict return precautions discussed. I explained the diagnosis and have given explicit precautions to return to the ER including for any other new or worsening symptoms. The patient understands and accepts the medical plan as it's been dictated and I have answered their questions. Discharge instructions concerning home care and prescriptions have been given. The patient is STABLE and is discharged to home in good condition.   Final Clinical Impressions(s) / ED Diagnoses   Final diagnoses:  Yeast infection of the skin  Stoma dermatitis    New Prescriptions New Prescriptions   FLUCONAZOLE (DIFLUCAN) 150 MG TABLET    Take 1 tablet (150 mg total) by mouth once. TAKE ON 11/27/15   NYSTATIN CREAM (MYCOSTATIN)    Apply 1 application topically 4 (four) times daily. Apply to  affected area every 4-6 hours x 10 days     Zacarias Pontes, PA-C 11/24/15 1620    Leo Grosser, MD 11/25/15 0005

## 2015-11-24 NOTE — ED Notes (Signed)
Patient does not have clean sample of urine from stoma yet, but is aware a sample is needed

## 2015-11-24 NOTE — ED Triage Notes (Signed)
Pt complaint of stoma pain; site pale pink in color and white substance surrounding site. Stoma placement 06/2015.

## 2015-11-24 NOTE — Discharge Instructions (Signed)
Your symptoms are likely related to a yeast infection of the skin around your stoma. Keep that area very clean and as dry as possible. Use nystatin cream as directed. Take diflucan dose on Monday 11/27/15. Stay well hydrated. Your urine does not appear to have an infection, but it will be sent for culture and if anything grows out then the lab will call you. Follow up with your urologist in 3-4 days for recheck of symptoms. Return to the ER for changes or worsening symptoms.

## 2015-11-24 NOTE — ED Notes (Signed)
PT DISCHARGED. INSTRUCTIONS AND PRESCRIPTIONS GIVEN. AAOX4. PT IN NO APPARENT DISTRESS OR PAIN. THE OPPORTUNITY TO ASK QUESTIONS WAS PROVIDED. 

## 2015-11-25 ENCOUNTER — Encounter (HOSPITAL_COMMUNITY): Payer: Self-pay | Admitting: Physician Assistant

## 2015-11-25 LAB — URINE CULTURE

## 2016-01-26 ENCOUNTER — Ambulatory Visit (HOSPITAL_COMMUNITY)
Admission: RE | Admit: 2016-01-26 | Discharge: 2016-01-26 | Disposition: A | Discharge status: Home / Self Care | Payer: Medicaid Other | Source: Ambulatory Visit | Attending: Urology | Admitting: Urology

## 2016-01-26 ENCOUNTER — Other Ambulatory Visit: Payer: Self-pay | Admitting: Urology

## 2016-01-26 DIAGNOSIS — C675 Malignant neoplasm of bladder neck: Secondary | ICD-10-CM | POA: Insufficient documentation

## 2016-10-15 IMAGING — CT CT ABD-PELV W/ CM
2 of 6 series · 16 of 46 positions shown, 18 images · IV contrast (OMNIPAQUE 300)
Comparison: CT 03/22/2015.

CLINICAL DATA: Status post cystectomy and prostatectomy for bladder
cancer 3 weeks ago. Patient reports drainage around the nephrostomy
bag and abdominal distention.

EXAM:
CT ABDOMEN AND PELVIS WITH CONTRAST
TECHNIQUE: Multidetector CT imaging of the abdomen and pelvis was performed
using the standard protocol following bolus administration of
intravenous contrast.
CONTRAST:  80mL 2GCI5H-FAA IOPAMIDOL (2GCI5H-FAA) INJECTION 61%

[Series 2: abd/pel with · axial · 0.74mm/px · z∈[+1166,+1546]mm · 13 of 90 slices shown, 15 images]
[im 7/90  soft-tissue]
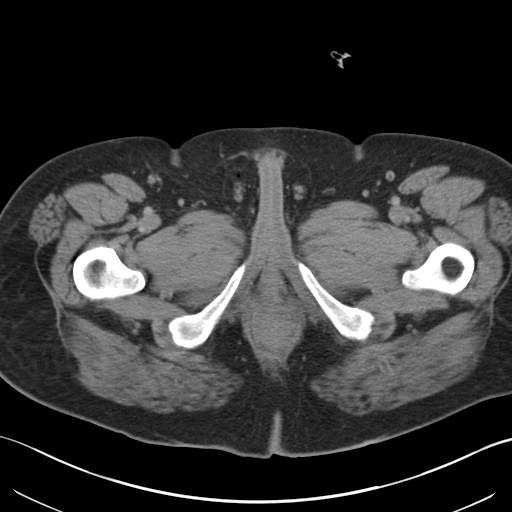
[im 7/90  bone]
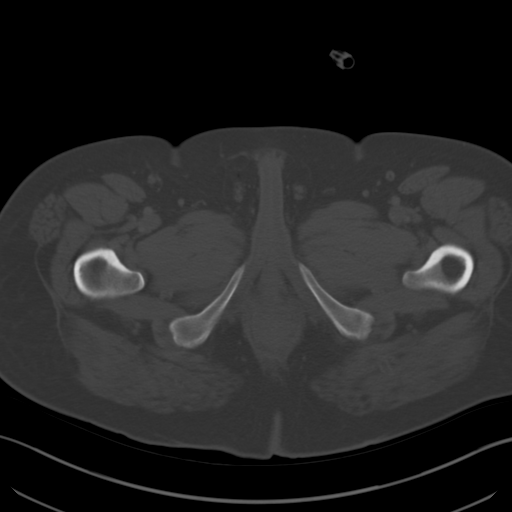
[im 13/90  soft-tissue]
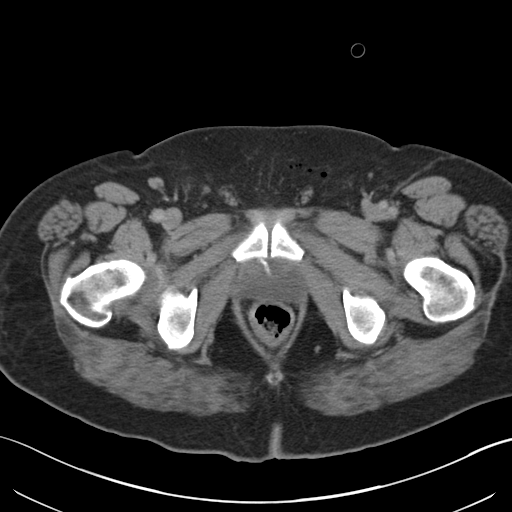
[im 20/90  soft-tissue]
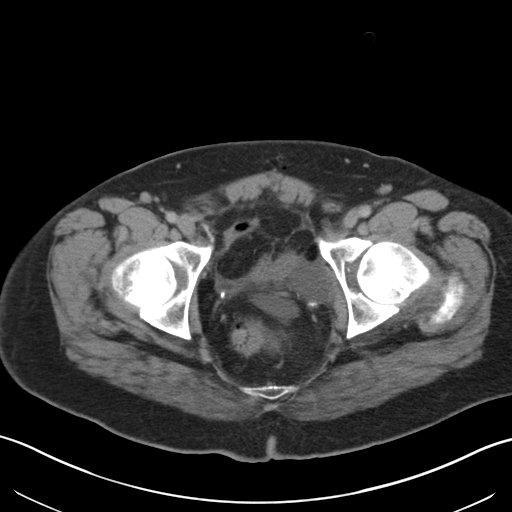
[im 26/90  soft-tissue]
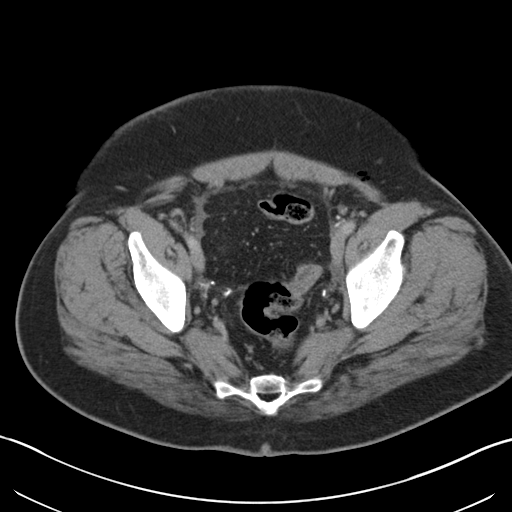
[im 32/90  soft-tissue]
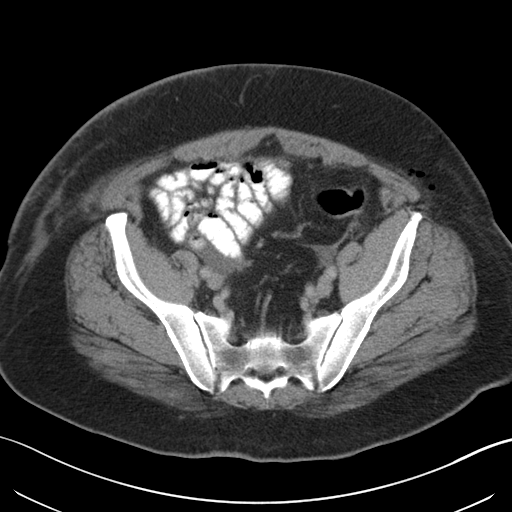
[im 39/90  soft-tissue]
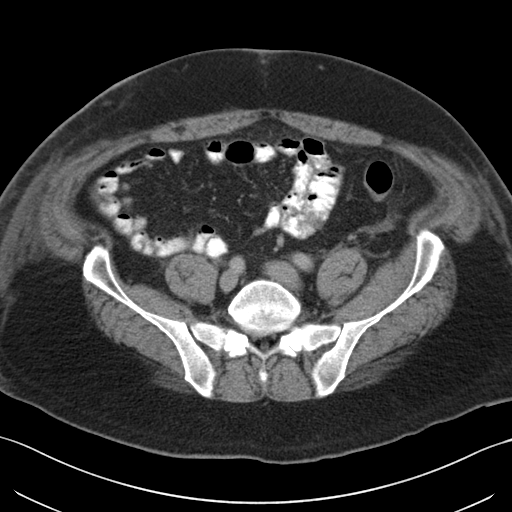
[im 45/90  soft-tissue]
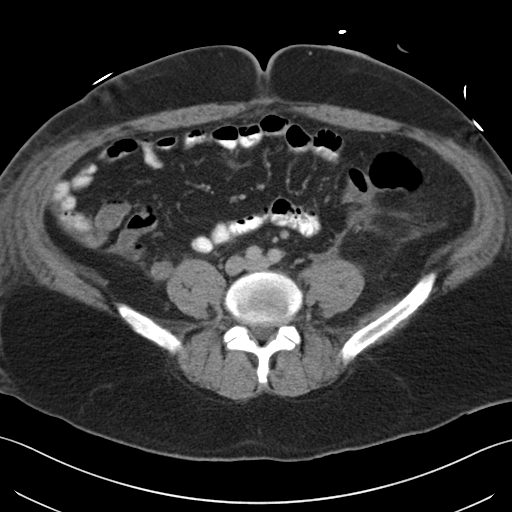
[im 51/90  soft-tissue]
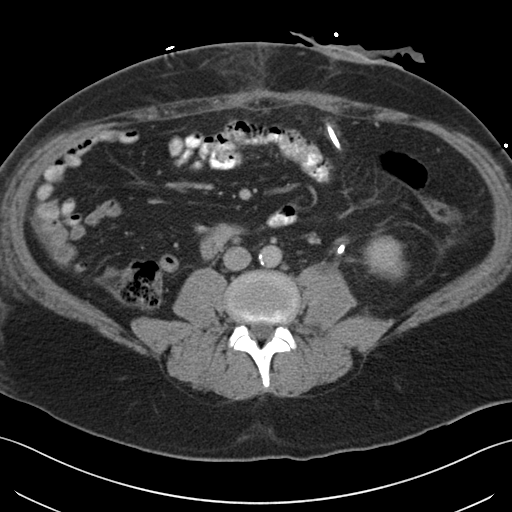
[im 58/90  soft-tissue]
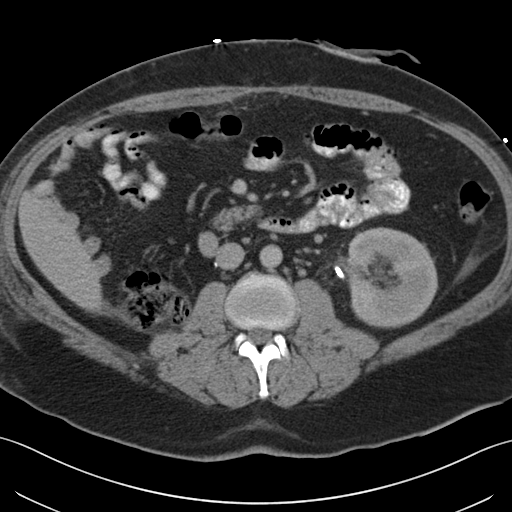
[im 58/90  bone]
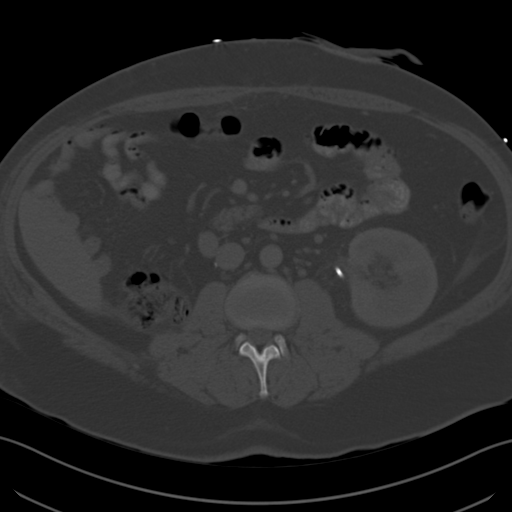
[im 64/90  soft-tissue]
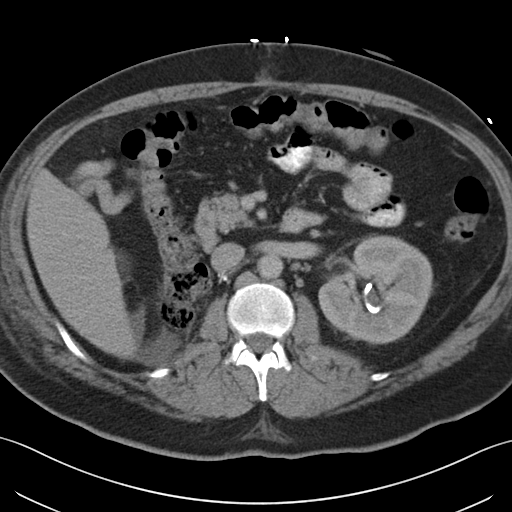
[im 70/90  soft-tissue]
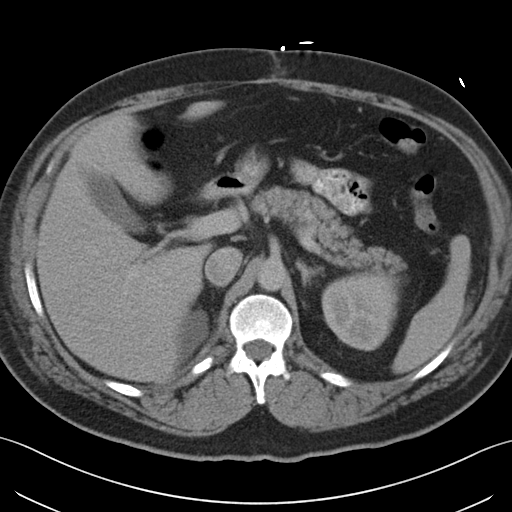
[im 77/90  soft-tissue]
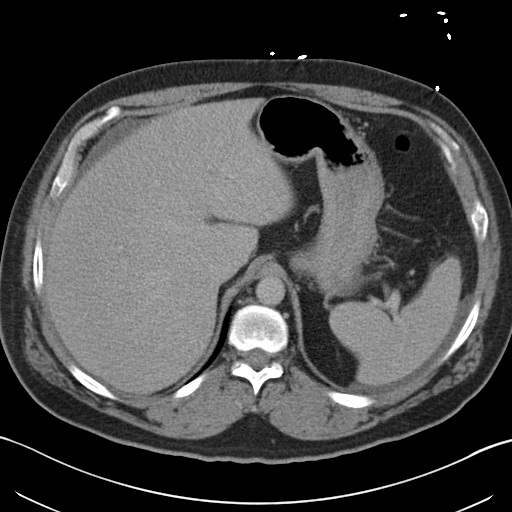
[im 83/90  soft-tissue]
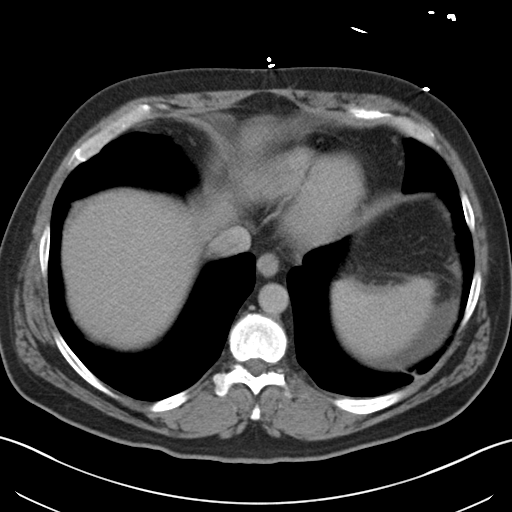

[Series 3: coronal a/|p · coronal · 0.79mm/px · 3 of 101 slices shown]
[im 34/101  soft-tissue]
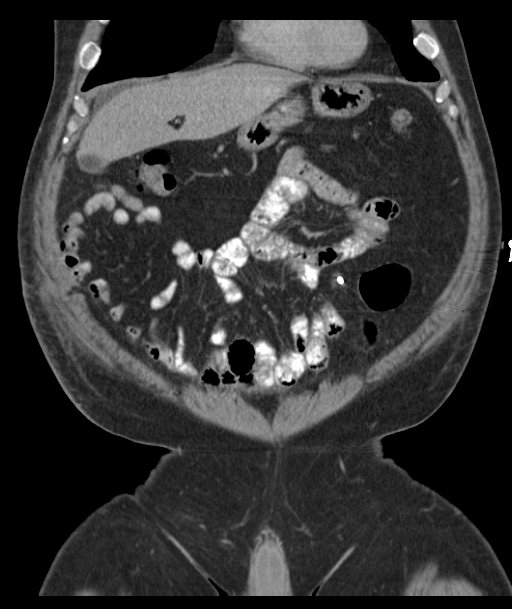
[im 45/101  soft-tissue]
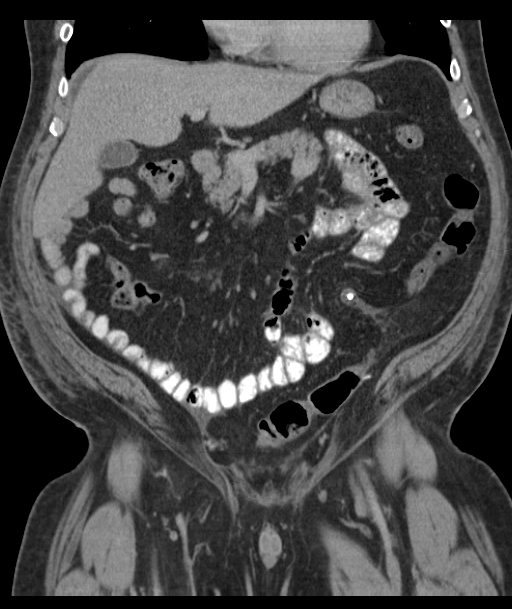
[im 56/101  soft-tissue]
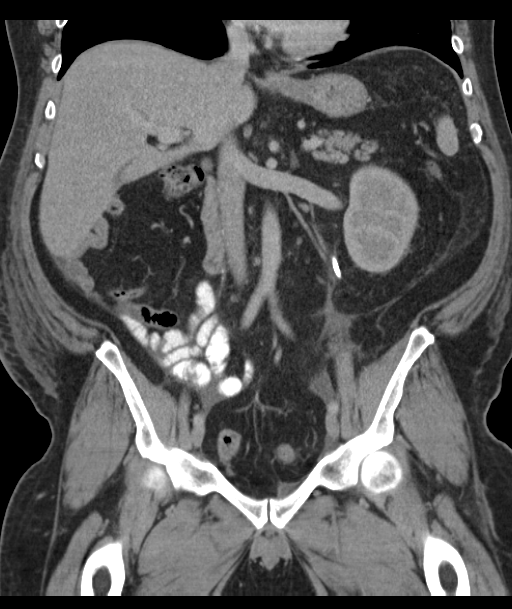

[16 of 46 positions shown; findings below may reference images not displayed]

FINDINGS: Lower chest: Clear lung bases. No significant pleural or pericardial
effusion.

Hepatobiliary: The liver is normal in density without focal
abnormality. No evidence of gallstones, gallbladder wall thickening
or biliary dilatation.

Pancreas: Unremarkable. No pancreatic ductal dilatation or
surrounding inflammatory changes.

Spleen: Normal in size without focal abnormality.

Adrenals/Urinary Tract: Status post remote right nephro ureterectomy
and adrenalectomy. The left adrenal gland appears normal. Interval
cystectomy and placement of a left ureteral stent, extending into a
left anterior abdominal wall nephrostomy. No evidence of ureteral
obstruction or periureteral fluid collection. The left kidney
appears normal.

Stomach/Bowel: No evidence of bowel wall thickening, distention or
surrounding inflammatory change. The appendix appears normal.

Vascular/Lymphatic: There are no enlarged abdominal or pelvic lymph
nodes. Mild aortoiliac atherosclerosis.

Reproductive: Interval prostatectomy.

Other: There is a small amount of ascites within the pelvis, both
pericolic gutters and adjacent to the liver and spleen. No worrisome
focal fluid collections are identified. There are postsurgical
changes within the midline anterior abdominal wall. There is some
soft tissue emphysema within the subcutaneous fat of the lower
anterior abdominal wall, asymmetric to the left. This extends into
both inguinal canals.

Musculoskeletal: No acute or significant osseous findings.
IMPRESSION: 1. Status post interval cystectomy and prostatectomy without
apparent complication. Left ureteral stent extends into a
nephrostomy bag and appears patent.
2. Small amount of nonspecific ascites. No focal extraluminal fluid
collection identified.
3. Nonspecific soft tissue emphysema within the low anterior
abdominal wall, extending into both inguinal regions.
4. Remote right nephroureterectomy and adrenalectomy.

## 2016-10-17 ENCOUNTER — Emergency Department (HOSPITAL_COMMUNITY): Payer: Medicaid Other

## 2016-10-17 ENCOUNTER — Emergency Department (HOSPITAL_COMMUNITY)
Admission: EM | Admit: 2016-10-17 | Discharge: 2016-10-17 | Disposition: A | Discharge status: Home / Self Care | Payer: Medicaid Other | Attending: Emergency Medicine | Admitting: Emergency Medicine

## 2016-10-17 DIAGNOSIS — F17210 Nicotine dependence, cigarettes, uncomplicated: Secondary | ICD-10-CM | POA: Insufficient documentation

## 2016-10-17 DIAGNOSIS — C651 Malignant neoplasm of right renal pelvis: Secondary | ICD-10-CM | POA: Insufficient documentation

## 2016-10-17 DIAGNOSIS — N189 Chronic kidney disease, unspecified: Secondary | ICD-10-CM | POA: Insufficient documentation

## 2016-10-17 DIAGNOSIS — W28XXXA Contact with powered lawn mower, initial encounter: Secondary | ICD-10-CM | POA: Insufficient documentation

## 2016-10-17 DIAGNOSIS — Y999 Unspecified external cause status: Secondary | ICD-10-CM | POA: Insufficient documentation

## 2016-10-17 DIAGNOSIS — S9032XA Contusion of left foot, initial encounter: Secondary | ICD-10-CM | POA: Insufficient documentation

## 2016-10-17 DIAGNOSIS — C679 Malignant neoplasm of bladder, unspecified: Secondary | ICD-10-CM | POA: Insufficient documentation

## 2016-10-17 DIAGNOSIS — R2242 Localized swelling, mass and lump, left lower limb: Secondary | ICD-10-CM | POA: Insufficient documentation

## 2016-10-17 DIAGNOSIS — Y9301 Activity, walking, marching and hiking: Secondary | ICD-10-CM | POA: Insufficient documentation

## 2016-10-17 DIAGNOSIS — Y9289 Other specified places as the place of occurrence of the external cause: Secondary | ICD-10-CM | POA: Insufficient documentation

## 2016-10-17 MED ORDER — ACETAMINOPHEN 500 MG PO TABS
1000.0000 mg | ORAL_TABLET | Freq: Once | ORAL | Status: AC
  Administered 2016-10-17: 1000 mg via ORAL
  Filled 2016-10-17: qty 2

## 2016-10-17 NOTE — ED Triage Notes (Signed)
Pt c/o left foot injury onset 2 days ago after accidentally kicking metal "deck" of lawn mower while barefoot in flip flops.  Left foot edematous with point tenderness at first metarsal-phalange joint. Left 1st toe "tingling," sensation intact, motor function reduced due to pain but intact.

## 2016-10-17 NOTE — ED Notes (Signed)
Bed: WTR7 Expected date:  Expected time:  Means of arrival:  Comments: 

## 2016-10-17 NOTE — Discharge Instructions (Signed)
You may take Tylenol as prescribed over-the-counter as needed for pain relief. I also recommend continuing to rest, elevate and apply ice to her foot for 15-20 minutes 3-4 times daily to help with pain and swelling. Follow-up with your primary care provider in the next week if your symptoms have not improved. Please return to the Emergency Department if symptoms worsen or new onset of fever, redness, warmth, worsening swelling, numbness, weakness, decreased range of motion.

## 2016-10-17 NOTE — ED Provider Notes (Signed)
[place] DEPT Provider Note   CSN: [id number] Arrival date & time: 10/17/16  0904     History   Chief Complaint Chief Complaint  Patient presents with  . Foot Injury    HPI Erdem Naas is a 46 y.o. male.  HPI   Patient is a 46 year old male who presents the ED with complaint of left foot pain, onset 2 days. Patient reports walking into his building to grab some of his supplies for work and reports taking the deck of his lawnmower while he was wearing flip-flops. He reports the following day he was on his feet all day at work and reports associated pain and swelling which have gradually worsened over the past day. Patient reports elevating and applying ice to his foot with minimal relief. Denies taking any medications. Denies fever, redness, warmth, laceration, numbness, weakness. Denies prior surgeries to left foot.   Past Medical History:  Diagnosis Date  . Bladder cancer ([place])   . Borderline hypertension   . Chronic kidney disease   . History of cardiac murmur as a child   . 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 Solitary left kidney   . Urinary hesitancy     Patient Active Problem List   Diagnosis Date Noted  . Bladder cancer ([place]) 03/31/2015  . Perinephric hematoma 05/06/2014  . Leukocytosis 05/06/2014  . Accelerated hypertension 05/06/2014  . Right flank pain 05/06/2014  . Renal mass     Past Surgical History:  Procedure Laterality Date  . CYSTOSCOPY W/ URETERAL STENT PLACEMENT Right 05/07/2014   Procedure: CYSTOSCOPY WITH RETROGRADE PYELOGRAM/RIGHT DIAGNOSTIC URETEROSCOPY, RIGHT URETERAL STENT PLACEMENT;  Surgeon: Raynelle Bring, MD;  Location: [facility];  Service: Urology;  Laterality: Right;  . CYSTOSCOPY WITH INJECTION N/A 06/16/2015   Procedure: CYSTOSCOPY WITH INJECTION OF  INDOCYANINE GREEN DYE;  Surgeon: [name], MD;  Location: [facility];  Service: Urology;  Laterality: N/A;  . CYSTOSCOPY WITH URETEROSCOPY Right 05/05/2014   Procedure: CYSTOSCOPY/ RIGHT URETEROSCOPY/ RIGHT RETROGRADE PYLEGRAM WITH INTERPRETATION/ RIGHT URETERAL BALLOON DILATION OF UPJ OBSTRUCTION/ BIOPSY OF RIGHT RENAL PELVIC MASS;  Surgeon: Ailene Rud, MD;  Location: [facility];  Service: Urology;  Laterality: Right;  . ROBOT ASSISTED LAPAROSCOPIC COMPLETE CYSTECT ILEAL CONDUIT N/A 06/16/2015   Procedure: XI ROBOTIC ASSISTED LAPAROSCOPIC COMPLETE CYSTECT WITH LEFT CUTANEOUS URETEROSTOMY EXTENSIVE ADHESIOLYSIS;  Surgeon: [name], MD;  Location: [facility];  Service: Urology;  Laterality: N/A;  . ROBOT ASSISTED LAPAROSCOPIC RADICAL PROSTATECTOMY N/A 06/16/2015   Procedure: XI ROBOTIC ASSISTED LAPAROSCOPIC RADICAL PROSTATECTOMY;  Surgeon: [name], MD;  Location: [facility];  Service: Urology;  Laterality: N/A;  . ROBOT ASSITED LAPAROSCOPIC NEPHROURETERECTOMY Right 05/27/2014   Procedure: ROBOT ASSITED LAPAROSCOPIC NEPHROURETERECTOMY;  Surgeon: [name], MD;  Location: [facility];  Service: Urology;  Laterality: Right;  . TRANSURETHRAL RESECTION OF BLADDER TUMOR WITH GYRUS (TURBT-GYRUS) N/A 03/31/2015   Procedure: TRANSURETHRAL RESECTION OF BLADDER TUMOR WITH GYRUS (TURBT-GYRUS);  Surgeon: [name], MD;  Location: [facility];  Service: Urology;  Laterality: N/A;  . TRANSURETHRAL RESECTION OF BLADDER TUMOR WITH GYRUS (TURBT-GYRUS) N/A 05/10/2015   Procedure: TRANSURETHRAL RESECTION OF BLADDER TUMOR WITH GYRUS (TURBT-GYRUS);  Surgeon: [name], MD;  Location: [facility];  Service: Urology;  Laterality: N/A;       Home  Medications    Prior to Admission medications   Medication Sig Start Date End Date Taking? Authorizing Provider  guaiFENesin (MUCINEX) 600 MG 12 hr tablet Take 1 tablet (600 mg total) by mouth 2 (two) times  daily. Patient not taking: Reported on 11/24/2015 09/23/15   Dorie Rank, MD  nystatin cream (MYCOSTATIN) Apply 1 application topically 4 (four) times daily. Apply to affected area every 4-6 hours x 10 days 11/24/15   Street, [place], PA-C  oxyCODONE-acetaminophen (ROXICET) 5-325 MG tablet Take 1-2 tablets by mouth every 6 (six) hours as needed for moderate pain or severe pain. Post-operatively Patient not taking: Reported on 09/20/2015 06/19/15   Christell Faith, MD  predniSONE (DELTASONE) 20 MG tablet Take 3 tablets (60 mg total) by mouth daily. Patient not taking: Reported on 11/24/2015 09/23/15   Dorie Rank, MD  senna-docusate (SENOKOT-S) 8.6-50 MG tablet Take 2 tablets by mouth 2 (two) times daily. While taking pain meds to prevent constipation. Patient not taking: Reported on 09/20/2015 06/19/15   Christell Faith, MD  sulfamethoxazole-trimethoprim (BACTRIM DS,SEPTRA DS) 800-160 MG tablet Take 1 tablet by mouth 2 (two) times daily. Start taking this the day before your stent comes out. Patient not taking: Reported on 07/03/2015 06/19/15   Christell Faith, MD    Family History Family History  Problem Relation Age of Onset  . Breast cancer Mother   . HIV/AIDS Father     Social History Social History  Substance Use Topics  . Smoking status: Current Every Day Smoker    Packs/day: 0.50    Years: 20.00    Types: Cigarettes  . Smokeless tobacco: Never Used  . Alcohol use 0.0 oz/week     Comment: rare     Allergies   Valium   Review of Systems Review of Systems  Constitutional: Negative for fever.  Musculoskeletal: Positive for arthralgias (left foot) and joint swelling.  Skin: Negative for wound.  Neurological: Negative for weakness and numbness.     Physical Exam Updated Vital Signs BP (!) 143/107 (BP Location: Left Arm)   Pulse 84   Temp 98.1 F (36.7 C) (Oral)   Resp 16   SpO2 98%   Physical Exam  Constitutional: He is oriented to person, place, and time. He appears  well-developed and well-nourished. No distress.  HENT:  Head: Normocephalic and atraumatic.  Eyes: Conjunctivae and EOM are normal. Right eye exhibits no discharge. Left eye exhibits no discharge. No scleral icterus.  Neck: Normal range of motion. Neck supple.  Cardiovascular: Normal rate and intact distal pulses.   Pulmonary/Chest: Effort normal.  Musculoskeletal: Normal range of motion. He exhibits edema and tenderness. He exhibits no deformity.       Left knee: Normal.       Left ankle: Normal.       Left lower leg: Normal.       Left foot: There is tenderness and swelling. There is normal range of motion, no bony tenderness, normal capillary refill, no crepitus, no deformity and no laceration.       Feet:  Mild swelling and tenderness over dorsum of left midfoot and left 1st MTP joint/metatarsal. FROM of left toes, foot, ankle and knee with 5/5 strength. Sensation grossly intact. 2+ DP pulse. Cap refill <2. No erythema, warmth, abrasion, ecchymoses or laceration present.  Neurological: He is alert and oriented to person, place, and time.  Skin: Skin is warm and dry. Capillary refill takes less than 2 seconds. He is not diaphoretic.  Nursing note and vitals reviewed.    ED Treatments / Results  Labs (all labs ordered are listed, but only abnormal results are displayed) Labs Reviewed - No data to display  EKG  EKG Interpretation None       Radiology Dg Foot Complete Left  Result Date: 10/17/2016 CLINICAL DATA:  Kicked lawnmower today with foot pain, initial encounter EXAM: LEFT FOOT - COMPLETE 3+ VIEW COMPARISON:  None. FINDINGS: There is no evidence of fracture or dislocation. There is no evidence of arthropathy or other focal bone abnormality. Soft tissues are unremarkable. IMPRESSION: No acute abnormality noted. Electronically Signed   By: Inez Catalina M.D.   On: 10/17/2016 09:57    Procedures Procedures (including critical care time)  Medications Ordered in  ED Medications  acetaminophen (TYLENOL) tablet 1,000 mg (1,000 mg Oral Given 10/17/16 0933)     Initial Impression / Assessment and Plan / ED Course  I have reviewed the triage vital signs and the nursing notes.  Pertinent labs & imaging results that were available during my care of the patient were reviewed by me and considered in my medical decision making (see chart for details).     Patient X-Ray negative for obvious fracture or dislocation. Suspect foot contusion. Pain managed in ED. Pt advised to follow up with PCP if symptoms persist. Patient given post-op shoe while in ED, conservative therapy recommended and discussed. Patient will be dc home & is agreeable with above plan.   Final Clinical Impressions(s) / ED Diagnoses   Final diagnoses:  Contusion of left foot, initial encounter    New Prescriptions New Prescriptions   No medications on file     Renold Don 10/17/16 1029    Tanna Furry, MD 10/24/16 [phone]

## 2017-02-14 ENCOUNTER — Encounter (HOSPITAL_COMMUNITY): Payer: Self-pay | Admitting: Emergency Medicine

## 2017-02-14 ENCOUNTER — Emergency Department (HOSPITAL_COMMUNITY)
Admission: EM | Admit: 2017-02-14 | Discharge: 2017-02-14 | Disposition: A | Discharge status: Home / Self Care | Payer: Self-pay | Attending: Emergency Medicine | Admitting: Emergency Medicine

## 2017-02-14 DIAGNOSIS — N189 Chronic kidney disease, unspecified: Secondary | ICD-10-CM | POA: Insufficient documentation

## 2017-02-14 DIAGNOSIS — J069 Acute upper respiratory infection, unspecified: Secondary | ICD-10-CM | POA: Insufficient documentation

## 2017-02-14 DIAGNOSIS — F17210 Nicotine dependence, cigarettes, uncomplicated: Secondary | ICD-10-CM | POA: Insufficient documentation

## 2017-02-14 DIAGNOSIS — I129 Hypertensive chronic kidney disease with stage 1 through stage 4 chronic kidney disease, or unspecified chronic kidney disease: Secondary | ICD-10-CM | POA: Insufficient documentation

## 2017-02-14 LAB — RAPID STREP SCREEN (MED CTR MEBANE ONLY): STREPTOCOCCUS, GROUP A SCREEN (DIRECT): NEGATIVE

## 2017-02-14 MED ORDER — DM-GUAIFENESIN ER 30-600 MG PO TB12: 1.0000 | ORAL_TABLET | Freq: Two times a day (BID) | ORAL | 0 refills | Status: DC

## 2017-02-14 NOTE — Discharge Instructions (Signed)
Please read and follow all provided instructions.  Your diagnoses today include:  1. Upper respiratory tract infection, unspecified type     You appear to have an upper respiratory infection (URI). An upper respiratory tract infection, or cold, is a viral infection of the air passages leading to the lungs. It should improve gradually after 5-7 days. You may have a lingering cough that lasts for 2- 4 weeks after the infection.  Tests performed today include: Vital signs. See below for your results today.   Medications prescribed:   Take any prescribed medications only as directed. Treatment for your infection is aimed at treating the symptoms. There are no medications, such as antibiotics, that will cure your infection.   Home care instructions:  Follow any educational materials contained in this packet.   Your illness is contagious and can be spread to others, especially during the first 3 or 4 days. It cannot be cured by antibiotics or other medicines. Take basic precautions such as washing your hands often, covering your mouth when you cough or sneeze, and avoiding public places where you could spread your illness to others.   Please continue drinking plenty of fluids.  Use over-the-counter medicines as needed as directed on packaging for symptom relief.  You may also use ibuprofen or tylenol as directed on packaging for pain or fever.  Do not take multiple medicines containing Tylenol or acetaminophen to avoid taking too much of this medication.  Follow-up instructions: Please follow-up with your primary care provider in the next 3 days for further evaluation of your symptoms if you are not feeling better.   Return instructions:  Please return to the Emergency Department if you experience worsening symptoms.  RETURN IMMEDIATELY IF you develop shortness of breath, confusion or altered mental status, a new rash, become dizzy, faint, or poorly responsive, or are unable to be cared for at  home. Please return if you have persistent vomiting and cannot keep down fluids or develop a fever that is not controlled by tylenol or motrin.   Please return if you have any other emergent concerns.  Additional Information:  Your vital signs today were: BP (!) 136/99    Pulse 81    Temp 98.9 F (37.2 C)    Resp 16    Ht 5\' 7"  (1.702 m)    Wt 90.7 kg (200 lb)    SpO2 97%    BMI 31.32 kg/m  If your blood pressure (BP) was elevated above 135/85 this visit, please have this repeated by your doctor within one month. --------------   Please establish care with a primary care provider for a recheck of high blood pressure.

## 2017-02-14 NOTE — ED Provider Notes (Signed)
[place] DEPT Provider Note   CSN: [id number] Arrival date & time: 02/14/17  0737     History   Chief Complaint Chief Complaint  Patient presents with  . Nasal Congestion  . Sore Throat    HPI Costantino Hickman is a 46 y.o. male.  HPI   Patient is a 46 year old male with a history of renal cell carcinoma and borderline hypertension presenting for cough and congestion times 1 day.  Patient reports that he began having a cough productive of green sputum this morning. Patient has had a sore and scratchy throat for one day.  No hemoptysis.  No wheezing or shortness of breath.  No chest pain.  Patient denies any fever or chills.  Patient has had some mild sinus pressure and frontal and maxillary sinuses.  No headache.  No nausea or vomiting. Patient is a smoker.  Patient has had a sick contact in his wife.  Past Medical History:  Diagnosis Date  . Bladder cancer ([place])   . Borderline hypertension   . Chronic kidney disease   . History of cardiac murmur as a child   . 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 Solitary left kidney   . Urinary hesitancy     Patient Active Problem List   Diagnosis Date Noted  . Bladder cancer ([place]) 03/31/2015  . Perinephric hematoma 05/06/2014  . Leukocytosis 05/06/2014  . Accelerated hypertension 05/06/2014  . Right flank pain 05/06/2014  . Renal mass     Past Surgical History:  Procedure Laterality Date  . CYSTOSCOPY W/ URETERAL STENT PLACEMENT Right 05/07/2014   Procedure: CYSTOSCOPY WITH RETROGRADE PYELOGRAM/RIGHT DIAGNOSTIC URETEROSCOPY, RIGHT URETERAL STENT PLACEMENT;  Surgeon: Raynelle Bring, MD;  Location: [facility];  Service: Urology;  Laterality: Right;  . CYSTOSCOPY WITH INJECTION N/A 06/16/2015   Procedure: CYSTOSCOPY WITH  INJECTION OF INDOCYANINE GREEN DYE;  Surgeon: [name], MD;  [facility];  Service: Urology;  Laterality: N/A;  . CYSTOSCOPY WITH URETEROSCOPY Right 05/05/2014   Procedure: CYSTOSCOPY/ RIGHT URETEROSCOPY/ RIGHT RETROGRADE PYLEGRAM WITH INTERPRETATION/ RIGHT URETERAL BALLOON DILATION OF UPJ OBSTRUCTION/ BIOPSY OF RIGHT RENAL PELVIC MASS;  Surgeon: Ailene Rud, MD;  Location: [facility];  Service: Urology;  Laterality: Right;  . ROBOT ASSISTED LAPAROSCOPIC COMPLETE CYSTECT ILEAL CONDUIT N/A 06/16/2015   Procedure: XI ROBOTIC ASSISTED LAPAROSCOPIC COMPLETE CYSTECT WITH LEFT CUTANEOUS URETEROSTOMY EXTENSIVE ADHESIOLYSIS;  Surgeon: [name], MD;  [facility];  Service: Urology;  Laterality: N/A;  . ROBOT ASSISTED LAPAROSCOPIC RADICAL PROSTATECTOMY N/A 06/16/2015   Procedure: XI ROBOTIC ASSISTED LAPAROSCOPIC RADICAL PROSTATECTOMY;  Surgeon: [name], MD;  [facility];  Service: Urology;  Laterality: N/A;  . ROBOT ASSITED LAPAROSCOPIC NEPHROURETERECTOMY Right 05/27/2014   Procedure: ROBOT ASSITED LAPAROSCOPIC NEPHROURETERECTOMY;  Surgeon: [name], MD;  [facility];  Service: Urology;  Laterality: Right;  . TRANSURETHRAL RESECTION OF BLADDER TUMOR WITH GYRUS (TURBT-GYRUS) N/A 03/31/2015   Procedure: TRANSURETHRAL RESECTION OF BLADDER TUMOR WITH GYRUS (TURBT-GYRUS);  Surgeon: [name], MD;  Location: [facility];  Service: Urology;  Laterality: N/A;  . TRANSURETHRAL RESECTION OF BLADDER TUMOR WITH GYRUS (TURBT-GYRUS) N/A 05/10/2015   Procedure: TRANSURETHRAL RESECTION OF BLADDER TUMOR WITH GYRUS (TURBT-GYRUS);  Surgeon: [name], MD;  Location: [facility];  Service: Urology;  Laterality: N/A;       Home Medications    Prior to Admission medications   Medication Sig Start Date End Date Taking? Authorizing Provider  dextromethorphan-guaiFENesin (MUCINEX DM) 30-600 MG 12hr tablet Take 1 tablet by mouth  2 (two) times daily. 02/14/17   Langston Masker B, PA-C  guaiFENesin (MUCINEX) 600 MG 12 hr tablet Take 1 tablet (600 mg total) by mouth 2 (two) times daily. Patient not taking: Reported on 11/24/2015 09/23/15   Dorie Rank, MD  nystatin cream (MYCOSTATIN) Apply 1 application topically 4 (four) times daily. Apply to affected area every 4-6 hours x 10 days 11/24/15   Street, [place], PA-C  oxyCODONE-acetaminophen (ROXICET) 5-325 MG tablet Take 1-2 tablets by mouth every 6 (six) hours as needed for moderate pain or severe pain. Post-operatively Patient not taking: Reported on 09/20/2015 06/19/15   Christell Faith, MD  predniSONE (DELTASONE) 20 MG tablet Take 3 tablets (60 mg total) by mouth daily. Patient not taking: Reported on 11/24/2015 09/23/15   Dorie Rank, MD  senna-docusate (SENOKOT-S) 8.6-50 MG tablet Take 2 tablets by mouth 2 (two) times daily. While taking pain meds to prevent constipation. Patient not taking: Reported on 09/20/2015 06/19/15   Christell Faith, MD  sulfamethoxazole-trimethoprim (BACTRIM DS,SEPTRA DS) 800-160 MG tablet Take 1 tablet by mouth 2 (two) times daily. Start taking this the day before your stent comes out. Patient not taking: Reported on 07/03/2015 06/19/15   Christell Faith, MD    Family History Family History  Problem Relation Age of Onset  . Breast cancer Mother   . HIV/AIDS Father     Social History Social History  Substance Use Topics  . Smoking status: Current Every Day Smoker    Packs/day: 0.50    Years: 20.00    Types: Cigarettes  . Smokeless tobacco: Never Used  . Alcohol use 0.0 oz/week     Comment: rare     Allergies   Valium   Review of Systems Review of Systems  Constitutional: Negative for chills and fever.  HENT: Positive for congestion, rhinorrhea and sinus pressure.   Respiratory: Negative for shortness of breath and wheezing.   Cardiovascular: Negative for chest pain.  Gastrointestinal: Negative for abdominal pain, nausea and vomiting.    Musculoskeletal: Negative for myalgias.  Neurological: Negative for headaches.     Physical Exam Updated Vital Signs BP (!) 136/99   Pulse 81   Temp 98.9 F (37.2 C)   Resp 16   Ht 5\' 7"  (1.702 m)   Wt 90.7 kg (200 lb)   SpO2 97%   BMI 31.32 kg/m   Physical Exam  Constitutional: He appears well-developed and well-nourished. No distress.  Sitting comfortably in bed.  HENT:  Head: Normocephalic and atraumatic.  Normal phonation. No muffled voice sounds. Patient swallows secretions without difficulty. Dentition normal. No lesions of tongue or buccal mucosa. Uvula midline. No asymmetric swelling of the posterior pharynx. Erythema of posterior pharynx. No tonsillar exuduate. No lingual swelling. No induration inferior to tongue. No submandibular tenderness, swelling, or induration.  Tissues of the neck supple. No cervical lymphadenopathy.  Eyes: Conjunctivae are normal. Right eye exhibits no discharge. Left eye exhibits no discharge.  EOMs normal to gross examination.  Neck: Normal range of motion.  Cardiovascular: Normal rate and regular rhythm.   Murmur heard. Early systolic murmur  Pulmonary/Chest: Effort normal and breath sounds normal. No respiratory distress. He has no wheezes. He has no rales.  Abdominal: Soft. He exhibits no distension.  There is no tenderness.  Nephrostomy bag in place.  Musculoskeletal: Normal range of motion.  Neurological: He is alert.  Cranial nerves intact to gross observation. Patient moves extremities without difficulty.  Skin: Skin is warm and dry. He is not diaphoretic.  Psychiatric: He has a normal mood and affect. His behavior is normal. Judgment and thought content normal.  Nursing note and vitals reviewed.    ED Treatments / Results  Labs (all labs ordered are listed, but only abnormal results are displayed) Labs Reviewed  RAPID STREP SCREEN (NOT AT [facility])  CULTURE, GROUP A STREP [facility])    EKG  EKG Interpretation None        Radiology No results found.  Procedures Procedures (including critical care time)  Medications Ordered in ED Medications - No data to display   Initial Impression / Assessment and Plan / ED Course  I have reviewed the triage vital signs and the nursing notes.  Pertinent labs & imaging results that were available during my care of the patient were reviewed by me and considered in my medical decision making (see chart for details).     Final Clinical Impressions(s) / ED Diagnoses   Final diagnoses:  Upper respiratory tract infection, unspecified type   Trenden Hazelrigg is a 46 y.o. male who presents to ED for cough, congestion, and sore throat. Patient nontoxic appearing and in no acute distress. Rapid strep negative. Suspect viral etiology.  History most consistent with viral bronchitis.  Presentation not concerning for PTA or infection spread to soft tissue. No trismus or uvula deviation. Patient with normal phonation. Exam demonstrates soft neck tissue, no swelling or induration inferior to the tongue or in the submandibular space. Rx for Mucinex.  Patient has one kidney.  Reassured patient that he may take Mucinex, as there are no renal dosage adjustments.  Specific return precautions discussed for change in voice, inability to tolerate secretions, difficulty breathing or swallowing, or increased nausea or vomiting, cough productive of blood, fevers. Discussed importance of hydration. Recommended PCP follow up. All questions answered.    New Prescriptions New Prescriptions   DEXTROMETHORPHAN-GUAIFENESIN (MUCINEX DM) 30-600 MG 12HR TABLET    Take 1 tablet by mouth 2 (two) times daily.     Albesa Seen, PA-C 02/14/17 3570    Orlie Dakin, MD 02/14/17 1725

## 2017-02-14 NOTE — ED Triage Notes (Signed)
Patient started coughing and being congested yesterday. Patient woke up this morning with a sore throat. Patient states he wants to get control of this so that he doesn't miss work because he just started this new job.

## 2017-02-16 LAB — CULTURE, GROUP A STREP (THRC)

## 2017-05-25 ENCOUNTER — Emergency Department (HOSPITAL_COMMUNITY): Payer: Self-pay

## 2017-05-25 ENCOUNTER — Emergency Department (HOSPITAL_COMMUNITY)
Admission: EM | Admit: 2017-05-25 | Discharge: 2017-05-25 | Disposition: A | Discharge status: Home / Self Care | Payer: Self-pay | Attending: Emergency Medicine | Admitting: Emergency Medicine

## 2017-05-25 ENCOUNTER — Other Ambulatory Visit: Payer: Self-pay

## 2017-05-25 ENCOUNTER — Encounter (HOSPITAL_COMMUNITY): Payer: Self-pay | Admitting: *Deleted

## 2017-05-25 DIAGNOSIS — J209 Acute bronchitis, unspecified: Secondary | ICD-10-CM | POA: Insufficient documentation

## 2017-05-25 DIAGNOSIS — Z905 Acquired absence of kidney: Secondary | ICD-10-CM | POA: Insufficient documentation

## 2017-05-25 DIAGNOSIS — Z8551 Personal history of malignant neoplasm of bladder: Secondary | ICD-10-CM | POA: Insufficient documentation

## 2017-05-25 DIAGNOSIS — F17210 Nicotine dependence, cigarettes, uncomplicated: Secondary | ICD-10-CM | POA: Insufficient documentation

## 2017-05-25 MED ORDER — ALBUTEROL SULFATE HFA 108 (90 BASE) MCG/ACT IN AERS
2.0000 | INHALATION_SPRAY | RESPIRATORY_TRACT | Status: DC
  Administered 2017-05-25: 2 via RESPIRATORY_TRACT
  Filled 2017-05-25: qty 6.7

## 2017-05-25 MED ORDER — IPRATROPIUM-ALBUTEROL 0.5-2.5 (3) MG/3ML IN SOLN
3.0000 mL | Freq: Once | RESPIRATORY_TRACT | Status: AC
  Administered 2017-05-25: 3 mL via RESPIRATORY_TRACT
  Filled 2017-05-25: qty 3

## 2017-05-25 NOTE — ED Provider Notes (Signed)
[facility] DEPT Provider Note   CSN: [id number] Arrival date & time: 05/25/17  8182     History   Chief Complaint Chief Complaint  Patient presents with  . Cough  . Sore Throat    HPI Timothy Hickman is a 47 y.o. male.  HPI 47 year old male with a history ongoing tobacco abuse who presents the emergency department with productive cough and green phlegm.  He denies fevers and chills.  No history of asthma or COPD.  He has been trying over-the-counter medicine without improvement.  No fevers or chills.  Denies nausea vomiting diarrhea.  Recent sick contacts.  Symptoms are mild in severity    Past Medical History:  Diagnosis Date  . Bladder cancer ([place])   . Borderline hypertension   . Chronic kidney disease   . History of cardiac murmur as a child   . 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 Solitary left kidney   . Urinary hesitancy     Patient Active Problem List   Diagnosis Date Noted  . Bladder cancer ([place]) 03/31/2015  . Perinephric hematoma 05/06/2014  . Leukocytosis 05/06/2014  . Accelerated hypertension 05/06/2014  . Right flank pain 05/06/2014  . Renal mass     Past Surgical History:  Procedure Laterality Date  . CYSTOSCOPY W/ URETERAL STENT PLACEMENT Right 05/07/2014   Procedure: CYSTOSCOPY WITH RETROGRADE PYELOGRAM/RIGHT DIAGNOSTIC URETEROSCOPY, RIGHT URETERAL STENT PLACEMENT;  Surgeon: Raynelle Bring, MD;  Location: [facility];  Service: Urology;  Laterality: Right;  . CYSTOSCOPY WITH INJECTION N/A 06/16/2015   Procedure: CYSTOSCOPY WITH INJECTION OF INDOCYANINE GREEN DYE;  Surgeon: [name], MD;  [facility];  Service: Urology;  Laterality: N/A;  . CYSTOSCOPY WITH URETEROSCOPY Right 05/05/2014   Procedure: CYSTOSCOPY/ RIGHT URETEROSCOPY/ RIGHT  RETROGRADE PYLEGRAM WITH INTERPRETATION/ RIGHT URETERAL BALLOON DILATION OF UPJ OBSTRUCTION/ BIOPSY OF RIGHT RENAL PELVIC MASS;  Surgeon: Ailene Rud, MD;  Location: [facility];  Service: Urology;  Laterality: Right;  . ROBOT ASSISTED LAPAROSCOPIC COMPLETE CYSTECT ILEAL CONDUIT N/A 06/16/2015   Procedure: XI ROBOTIC ASSISTED LAPAROSCOPIC COMPLETE CYSTECT WITH LEFT CUTANEOUS URETEROSTOMY EXTENSIVE ADHESIOLYSIS;  Surgeon: [name], MD;  [facility];  Service: Urology;  Laterality: N/A;  . ROBOT ASSISTED LAPAROSCOPIC RADICAL PROSTATECTOMY N/A 06/16/2015   Procedure: XI ROBOTIC ASSISTED LAPAROSCOPIC RADICAL PROSTATECTOMY;  Surgeon: [name], MD;  [facility];  Service: Urology;  Laterality: N/A;  . ROBOT ASSITED LAPAROSCOPIC NEPHROURETERECTOMY Right 05/27/2014   Procedure: ROBOT ASSITED LAPAROSCOPIC NEPHROURETERECTOMY;  Surgeon: [name], MD;  [facility];  Service: Urology;  Laterality: Right;  . TRANSURETHRAL RESECTION OF BLADDER TUMOR WITH GYRUS (TURBT-GYRUS) N/A 03/31/2015   Procedure: TRANSURETHRAL RESECTION OF BLADDER TUMOR WITH GYRUS (TURBT-GYRUS);  Surgeon: [name], MD;  Location: [facility];  Service: Urology;  Laterality: N/A;  . TRANSURETHRAL RESECTION OF BLADDER TUMOR WITH GYRUS (TURBT-GYRUS) N/A 05/10/2015   Procedure: TRANSURETHRAL RESECTION OF BLADDER TUMOR WITH GYRUS (TURBT-GYRUS);  Surgeon: [name], MD;  Location: [facility];  Service: Urology;  Laterality: N/A;       Home Medications    Prior to Admission medications   Medication Sig Start Date End Date Taking? Authorizing Provider  dextromethorphan-guaiFENesin (MUCINEX DM) 30-600 MG 12hr tablet Take 1 tablet by mouth 2 (two) times daily.  02/14/17  Yes Albesa Seen, PA-C    Family History Family History  Problem Relation Age of Onset  . Breast cancer Mother   . HIV/AIDS Father     Social History Social History   Tobacco  Use  . Smoking status: Current Every Day Smoker    Packs/day: 0.50    Years: 20.00    Pack years: 10.00    Types: Cigarettes  . Smokeless tobacco: Never Used  Substance Use Topics  . Alcohol use: Yes    Alcohol/week: 0.0 oz    Comment: rare  . Drug use: No     Allergies   Valium   Review of Systems Review of Systems  All other systems reviewed and are negative.    Physical Exam Updated Vital Signs BP (!) 143/90 (BP Location: Right Arm)   Pulse 96   Temp 98.3 F (36.8 C) (Oral)   Resp 18   Ht 5\' 7"  (1.702 m)   Wt 95.7 kg (211 lb)   SpO2 98%   BMI 33.05 kg/m   Physical Exam  Constitutional: He is oriented to person, place, and time. He appears well-developed and well-nourished.  HENT:  Head: Normocephalic and atraumatic.  Eyes: EOM are normal.  Neck: Normal range of motion.  Cardiovascular: Normal rate, regular rhythm, normal heart sounds and intact distal pulses.  Pulmonary/Chest: Effort normal and breath sounds normal. No respiratory distress.  Abdominal: Soft. He exhibits no distension. There is no tenderness.  Musculoskeletal: Normal range of motion.  Neurological: He is alert and oriented to person, place, and time.  Skin: Skin is warm and dry.  Psychiatric: He has a normal mood and affect. Judgment normal.  Nursing note and vitals reviewed.    ED Treatments / Results  Labs (all labs ordered are listed, but only abnormal results are displayed) Labs Reviewed - No data to display  EKG  EKG Interpretation None       Radiology Dg Chest 2 View  Result Date: 05/25/2017 CLINICAL DATA:  Cough and congestion for 1 week. EXAM: CHEST  2 VIEW COMPARISON:  01/26/2016 and prior exam FINDINGS: The cardiomediastinal silhouette is unremarkable. Mild peribronchial thickening again noted. There is no evidence of focal airspace disease, pulmonary edema, suspicious pulmonary nodule/mass, pleural effusion, or pneumothorax. No acute bony abnormalities are identified.  IMPRESSION: Mild chronic peribronchial thickening. No evidence of focal pneumonia. Electronically Signed   By: Margarette Canada M.D.   On: 05/25/2017 10:31    Procedures Procedures (including critical care time)  Medications Ordered in ED Medications  albuterol (PROVENTIL HFA;VENTOLIN HFA) 108 (90 Base) MCG/ACT inhaler 2 puff (not administered)  ipratropium-albuterol (DUONEB) 0.5-2.5 (3) MG/3ML nebulizer solution 3 mL (3 mLs Nebulization Given 05/25/17 1026)     Initial Impression / Assessment and Plan / ED Course  I have reviewed the triage vital signs and the nursing notes.  Pertinent labs & imaging results that were available during my care of the patient were reviewed by me and considered in my medical decision making (see chart for details).     Feels better after bronchodilators.  Likely acute bronchitis with bronchospasm.  Chest x-ray without pneumonia.  Discharged home with albuterol inhaler.  Patient understands return to the ER for new or worsening symptoms.  Encouraged tobacco cessation.  Final Clinical Impressions(s) / ED Diagnoses   Final diagnoses:  Acute bronchitis, unspecified organism    ED Discharge Orders    None       Jola Schmidt, MD 05/25/17 1136

## 2017-05-25 NOTE — ED Triage Notes (Signed)
Symptoms of cold with green phlegm earlier this week, took OTC with improvement, now symptoms of cough and phlegm have returned as well as sore throat

## 2017-10-08 DIAGNOSIS — Z936 Other artificial openings of urinary tract status: Secondary | ICD-10-CM | POA: Diagnosis not present

## 2017-11-07 DIAGNOSIS — Z936 Other artificial openings of urinary tract status: Secondary | ICD-10-CM | POA: Diagnosis not present

## 2017-12-08 DIAGNOSIS — Z936 Other artificial openings of urinary tract status: Secondary | ICD-10-CM | POA: Diagnosis not present

## 2017-12-22 DIAGNOSIS — C678 Malignant neoplasm of overlapping sites of bladder: Secondary | ICD-10-CM | POA: Diagnosis not present

## 2017-12-26 ENCOUNTER — Ambulatory Visit (HOSPITAL_COMMUNITY)
Admission: RE | Admit: 2017-12-26 | Discharge: 2017-12-26 | Disposition: A | Discharge status: Home / Self Care | Payer: BLUE CROSS/BLUE SHIELD | Source: Ambulatory Visit | Attending: Urology | Admitting: Urology

## 2017-12-26 ENCOUNTER — Other Ambulatory Visit: Payer: Self-pay | Admitting: Urology

## 2017-12-26 DIAGNOSIS — C651 Malignant neoplasm of right renal pelvis: Secondary | ICD-10-CM | POA: Diagnosis not present

## 2017-12-26 DIAGNOSIS — C675 Malignant neoplasm of bladder neck: Secondary | ICD-10-CM | POA: Insufficient documentation

## 2017-12-30 DIAGNOSIS — C61 Malignant neoplasm of prostate: Secondary | ICD-10-CM | POA: Diagnosis not present

## 2017-12-30 DIAGNOSIS — C651 Malignant neoplasm of right renal pelvis: Secondary | ICD-10-CM | POA: Diagnosis not present

## 2017-12-30 DIAGNOSIS — C678 Malignant neoplasm of overlapping sites of bladder: Secondary | ICD-10-CM | POA: Diagnosis not present

## 2017-12-30 DIAGNOSIS — N5232 Erectile dysfunction following radical cystectomy: Secondary | ICD-10-CM | POA: Diagnosis not present

## 2018-01-05 DIAGNOSIS — Z936 Other artificial openings of urinary tract status: Secondary | ICD-10-CM | POA: Diagnosis not present

## 2018-02-03 DIAGNOSIS — Z936 Other artificial openings of urinary tract status: Secondary | ICD-10-CM | POA: Diagnosis not present

## 2018-03-02 ENCOUNTER — Other Ambulatory Visit: Payer: Self-pay

## 2018-03-02 ENCOUNTER — Emergency Department (HOSPITAL_COMMUNITY)
Admission: EM | Admit: 2018-03-02 | Discharge: 2018-03-02 | Disposition: A | Discharge status: Home / Self Care | Payer: BLUE CROSS/BLUE SHIELD | Attending: Emergency Medicine | Admitting: Emergency Medicine

## 2018-03-02 ENCOUNTER — Encounter (HOSPITAL_COMMUNITY): Payer: Self-pay

## 2018-03-02 DIAGNOSIS — F17210 Nicotine dependence, cigarettes, uncomplicated: Secondary | ICD-10-CM | POA: Diagnosis not present

## 2018-03-02 DIAGNOSIS — J029 Acute pharyngitis, unspecified: Secondary | ICD-10-CM | POA: Diagnosis not present

## 2018-03-02 DIAGNOSIS — N189 Chronic kidney disease, unspecified: Secondary | ICD-10-CM | POA: Insufficient documentation

## 2018-03-02 DIAGNOSIS — I129 Hypertensive chronic kidney disease with stage 1 through stage 4 chronic kidney disease, or unspecified chronic kidney disease: Secondary | ICD-10-CM | POA: Insufficient documentation

## 2018-03-02 DIAGNOSIS — R070 Pain in throat: Secondary | ICD-10-CM | POA: Diagnosis not present

## 2018-03-02 DIAGNOSIS — Z8551 Personal history of malignant neoplasm of bladder: Secondary | ICD-10-CM | POA: Diagnosis not present

## 2018-03-02 DIAGNOSIS — Z8553 Personal history of malignant neoplasm of renal pelvis: Secondary | ICD-10-CM | POA: Diagnosis not present

## 2018-03-02 MED ORDER — AZITHROMYCIN 250 MG PO TABS: 250.0000 mg | ORAL_TABLET | Freq: Every day | ORAL | 0 refills | Status: DC

## 2018-03-02 NOTE — ED Notes (Signed)
Discharge instructions reviewed with patient. Patient verbalizes understanding. VSS.   

## 2018-03-02 NOTE — ED Provider Notes (Signed)
[facility] DEPT Provider Note   CSN: [id number] Arrival date & time: 03/02/18  0751     History   Chief Complaint Chief Complaint  Patient presents with  . Sore Throat    HPI Timothy Hickman is a 47 y.o. male.  HPI 47 year old male with a history of chronic kidney disease and bladder cancer presents the emergency department worsening sore throat over the past 48 hours.  He is tried over-the-counter medications without improvement in symptoms.  Reports painful swallowing.  No difficulty breathing.  No significant nasal congestion.  Denies cough.  Denies neck swelling.  Symptoms are mild to moderate in severity.  No other complaints.   Past Medical History:  Diagnosis Date  . Bladder cancer ([place])   . Borderline hypertension   . Chronic kidney disease   . History of cardiac murmur as a child   . 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 Solitary left kidney   . Urinary hesitancy     Patient Active Problem List   Diagnosis Date Noted  . Bladder cancer ([place]) 03/31/2015  . Perinephric hematoma 05/06/2014  . Leukocytosis 05/06/2014  . Accelerated hypertension 05/06/2014  . Right flank pain 05/06/2014  . Renal mass     Past Surgical History:  Procedure Laterality Date  . CYSTOSCOPY W/ URETERAL STENT PLACEMENT Right 05/07/2014   Procedure: CYSTOSCOPY WITH RETROGRADE PYELOGRAM/RIGHT DIAGNOSTIC URETEROSCOPY, RIGHT URETERAL STENT PLACEMENT;  Surgeon: Raynelle Bring, MD;  [facility];  Service: Urology;  Laterality: Right;  . CYSTOSCOPY WITH INJECTION N/A 06/16/2015   Procedure: CYSTOSCOPY WITH INJECTION OF INDOCYANINE GREEN DYE;  Surgeon: [name], MD;  [facility];  Service: Urology;  Laterality: N/A;  . CYSTOSCOPY WITH URETEROSCOPY Right 05/05/2014   Procedure: CYSTOSCOPY/ RIGHT URETEROSCOPY/ RIGHT RETROGRADE PYLEGRAM WITH INTERPRETATION/ RIGHT URETERAL BALLOON DILATION OF UPJ OBSTRUCTION/ BIOPSY OF RIGHT RENAL PELVIC MASS;  Surgeon: Ailene Rud, MD;  Location: [facility];  Service: Urology;  Laterality: Right;  . ROBOT ASSISTED LAPAROSCOPIC COMPLETE CYSTECT ILEAL CONDUIT N/A 06/16/2015   Procedure: XI ROBOTIC ASSISTED LAPAROSCOPIC COMPLETE CYSTECT WITH LEFT CUTANEOUS URETEROSTOMY EXTENSIVE ADHESIOLYSIS;  Surgeon: [name], MD;  [facility];  Service: Urology;  Laterality: N/A;  . ROBOT ASSISTED LAPAROSCOPIC RADICAL PROSTATECTOMY N/A 06/16/2015   Procedure: XI ROBOTIC ASSISTED LAPAROSCOPIC RADICAL PROSTATECTOMY;  Surgeon: [name], MD;  [facility];  Service: Urology;  Laterality: N/A;  . ROBOT ASSITED LAPAROSCOPIC NEPHROURETERECTOMY Right 05/27/2014   Procedure: ROBOT ASSITED LAPAROSCOPIC NEPHROURETERECTOMY;  Surgeon: [name], MD;  [facility];  Service: Urology;  Laterality: Right;  . TRANSURETHRAL RESECTION OF BLADDER TUMOR WITH GYRUS (TURBT-GYRUS) N/A 03/31/2015   Procedure: TRANSURETHRAL RESECTION OF BLADDER TUMOR WITH GYRUS (TURBT-GYRUS);  Surgeon: [name], MD;  Location: [facility];  Service: Urology;  Laterality: N/A;  . TRANSURETHRAL RESECTION OF BLADDER TUMOR WITH GYRUS (TURBT-GYRUS) N/A 05/10/2015   Procedure: TRANSURETHRAL RESECTION OF BLADDER TUMOR WITH GYRUS (TURBT-GYRUS);  Surgeon: [name], MD;  Location: [facility];  Service: Urology;  Laterality: N/A;        Home Medications    Prior to Admission medications   Medication Sig Start Date End Date Taking? Authorizing Provider  azithromycin (ZITHROMAX) 250 MG tablet Take 1 tablet (250 mg total) by mouth daily. Take  first 2 tablets together, then 1 every day until finished. 03/02/18   Jola Schmidt, MD  dextromethorphan-guaiFENesin [facility] DM) 30-600 MG 12hr tablet Take 1 tablet by  mouth 2 (two) times daily. 02/14/17   Albesa Seen, PA-C    Family History Family History  Problem Relation Age of Onset  . Breast cancer Mother   . HIV/AIDS Father     Social History Social History   Tobacco Use  . Smoking status: Current Every Day Smoker    Packs/day: 0.50    Years: 20.00    Pack years: 10.00    Types: Cigarettes  . Smokeless tobacco: Never Used  Substance Use Topics  . Alcohol use: Yes    Comment: rare  . Drug use: No     Allergies   Valium   Review of Systems Review of Systems  All other systems reviewed and are negative.    Physical Exam Updated Vital Signs BP (!) 149/88 (BP Location: Right Arm)   Pulse 100   Temp 99.3 F (37.4 C) (Oral)   Resp 18   Ht 5\' 7"  (1.702 m)   Wt 99.8 kg   SpO2 99%   BMI 34.46 kg/m   Physical Exam  Constitutional: He is oriented to person, place, and time. He appears well-developed and well-nourished.  HENT:  Head: Normocephalic.  Right Ear: External ear normal.  Nose: Nose normal.  Mouth/Throat: No oropharyngeal exudate.  Uvula midline.  Posterior pharyngeal erythema without tonsillar exudate.  Tolerating secretions.  Oral airway patent.  Tongue is normal in size.  Eyes: EOM are normal.  Neck: Normal range of motion. Neck supple.  Pulmonary/Chest: Effort normal.  Abdominal: He exhibits no distension.  Musculoskeletal: Normal range of motion.  Lymphadenopathy:    He has no cervical adenopathy.  Neurological: He is alert and oriented to person, place, and time.  Psychiatric: He has a normal mood and affect.  Nursing note and vitals reviewed.    ED Treatments / Results  Labs (all labs ordered are listed, but only abnormal results are displayed) Labs Reviewed - No data to display  EKG None  Radiology No results found.  Procedures Procedures (including critical care time)  Medications Ordered in ED Medications - No data to display   Initial Impression / Assessment and Plan / ED  Course  I have reviewed the triage vital signs and the nursing notes.  Pertinent labs & imaging results that were available during my care of the patient were reviewed by me and considered in my medical decision making (see chart for details).     Likely viral URI/pharyngitis. Watch and wait abx prescribed with instructions to fill in 2 days if not improved. Tylenol recommended for symptom control with oral hydration  Final Clinical Impressions(s) / ED Diagnoses   Final diagnoses:  Acute pharyngitis, unspecified etiology    ED Discharge Orders         Ordered    azithromycin (ZITHROMAX) 250 MG tablet  Daily     03/02/18 0813           Jola Schmidt, MD 03/02/18 [phone]

## 2018-03-02 NOTE — ED Triage Notes (Signed)
Patient presents with sore throat and productive cough. Patient reports his symptoms started Saturday. Patient reports taking an antihistamine and cough drops for relief. Patient reports he is here today "to kick it in the bud before it gets bad."  Patient denies SOB/CP.

## 2018-03-02 NOTE — Discharge Instructions (Addendum)
Take Tylenol (acetaminophen) according to label instructions  Return to the ER as needed for new or worsening symptoms

## 2018-03-03 DIAGNOSIS — Z936 Other artificial openings of urinary tract status: Secondary | ICD-10-CM | POA: Diagnosis not present

## 2018-04-01 DIAGNOSIS — Z936 Other artificial openings of urinary tract status: Secondary | ICD-10-CM | POA: Diagnosis not present

## 2018-05-05 DIAGNOSIS — Z936 Other artificial openings of urinary tract status: Secondary | ICD-10-CM | POA: Diagnosis not present

## 2018-05-06 DIAGNOSIS — Z936 Other artificial openings of urinary tract status: Secondary | ICD-10-CM | POA: Diagnosis not present

## 2018-06-02 DIAGNOSIS — Z936 Other artificial openings of urinary tract status: Secondary | ICD-10-CM | POA: Diagnosis not present

## 2018-06-29 DIAGNOSIS — Z936 Other artificial openings of urinary tract status: Secondary | ICD-10-CM | POA: Diagnosis not present

## 2018-06-30 DIAGNOSIS — Z936 Other artificial openings of urinary tract status: Secondary | ICD-10-CM | POA: Diagnosis not present

## 2018-07-28 DIAGNOSIS — Z936 Other artificial openings of urinary tract status: Secondary | ICD-10-CM | POA: Diagnosis not present

## 2018-08-06 DIAGNOSIS — N5232 Erectile dysfunction following radical cystectomy: Secondary | ICD-10-CM | POA: Diagnosis not present

## 2018-08-26 DIAGNOSIS — Z936 Other artificial openings of urinary tract status: Secondary | ICD-10-CM | POA: Diagnosis not present

## 2018-08-31 DIAGNOSIS — Z936 Other artificial openings of urinary tract status: Secondary | ICD-10-CM | POA: Diagnosis not present

## 2018-09-23 DIAGNOSIS — Z936 Other artificial openings of urinary tract status: Secondary | ICD-10-CM | POA: Diagnosis not present

## 2018-09-24 DIAGNOSIS — Z936 Other artificial openings of urinary tract status: Secondary | ICD-10-CM | POA: Diagnosis not present

## 2018-10-22 DIAGNOSIS — Z936 Other artificial openings of urinary tract status: Secondary | ICD-10-CM | POA: Diagnosis not present

## 2018-10-31 ENCOUNTER — Other Ambulatory Visit: Payer: Self-pay

## 2018-10-31 ENCOUNTER — Encounter (HOSPITAL_COMMUNITY): Payer: Self-pay | Admitting: Emergency Medicine

## 2018-10-31 ENCOUNTER — Emergency Department (HOSPITAL_COMMUNITY)
Admission: EM | Admit: 2018-10-31 | Discharge: 2018-10-31 | Disposition: A | Discharge status: Home / Self Care | Payer: BC Managed Care – PPO | Attending: Emergency Medicine | Admitting: Emergency Medicine

## 2018-10-31 DIAGNOSIS — Z87891 Personal history of nicotine dependence: Secondary | ICD-10-CM | POA: Insufficient documentation

## 2018-10-31 DIAGNOSIS — G510 Bell's palsy: Secondary | ICD-10-CM

## 2018-10-31 DIAGNOSIS — R29810 Facial weakness: Secondary | ICD-10-CM | POA: Diagnosis not present

## 2018-10-31 DIAGNOSIS — N189 Chronic kidney disease, unspecified: Secondary | ICD-10-CM | POA: Insufficient documentation

## 2018-10-31 MED ORDER — VALACYCLOVIR HCL 1 G PO TABS: 1000.0000 mg | ORAL_TABLET | Freq: Three times a day (TID) | ORAL | 0 refills | Status: AC

## 2018-10-31 MED ORDER — ARTIFICIAL TEARS OPHTHALMIC OINT: TOPICAL_OINTMENT | OPHTHALMIC | 0 refills | Status: AC

## 2018-10-31 MED ORDER — PREDNISONE 20 MG PO TABS: 40.0000 mg | ORAL_TABLET | Freq: Every day | ORAL | 0 refills | Status: AC

## 2018-10-31 NOTE — ED Triage Notes (Signed)
Pt having right facial droop and weakness since he woke up this morning at 8am. Pt reports was fine when went to bed around 830p last night.

## 2018-10-31 NOTE — ED Provider Notes (Signed)
[facility] DEPT Provider Note   CSN: [id number] Arrival date & time: 10/31/18  1520     History   Chief Complaint Chief Complaint  Patient presents with  . facial weakness    HPI Timothy Hickman is a 48 y.o. male.     HPI Patient is a 48 year old male presents the emergency department with right facial weakness and right forehead weakness since this morning when he awoke.  He noticed that he had difficulty moving the right side of his face and eating felt abnormal.  He has no weakness or paresthesias or numbness of his upper or lower extremities.  No difficulty with his speech.  No history of stroke.  He does have a history of solitary left kidney and bladder and renal pelvic cancer.  No facial pain.  He does sleep on the right side of his face and is concerned that that could have caused his symptoms.  No facial swelling.  No other complaints   Past Medical History:  Diagnosis Date  . Bladder cancer ([place])   . Borderline hypertension   . Chronic kidney disease   . History of cardiac murmur as a child   . 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 Solitary left kidney   . Urinary hesitancy     Patient Active Problem List   Diagnosis Date Noted  . Bladder cancer ([place]) 03/31/2015  . Perinephric hematoma 05/06/2014  . Leukocytosis 05/06/2014  . Accelerated hypertension 05/06/2014  . Right flank pain 05/06/2014  . Renal mass     Past Surgical History:  Procedure Laterality Date  . CYSTOSCOPY W/ URETERAL STENT PLACEMENT Right 05/07/2014   Procedure: CYSTOSCOPY WITH RETROGRADE PYELOGRAM/RIGHT DIAGNOSTIC URETEROSCOPY, RIGHT URETERAL STENT PLACEMENT;  Surgeon: Raynelle Bring, MD;  [facility];  Service: Urology;  Laterality: Right;  . CYSTOSCOPY WITH INJECTION N/A  06/16/2015   Procedure: CYSTOSCOPY WITH INJECTION OF INDOCYANINE GREEN DYE;  Surgeon: [name], MD;  [facility];  Service: Urology;  Laterality: N/A;  . CYSTOSCOPY WITH URETEROSCOPY Right 05/05/2014   Procedure: CYSTOSCOPY/ RIGHT URETEROSCOPY/ RIGHT RETROGRADE PYLEGRAM WITH INTERPRETATION/ RIGHT URETERAL BALLOON DILATION OF UPJ OBSTRUCTION/ BIOPSY OF RIGHT RENAL PELVIC MASS;  Surgeon: Ailene Rud, MD;  Location: [facility];  Service: Urology;  Laterality: Right;  . ROBOT ASSISTED LAPAROSCOPIC COMPLETE CYSTECT ILEAL CONDUIT N/A 06/16/2015   Procedure: XI ROBOTIC ASSISTED LAPAROSCOPIC COMPLETE CYSTECT WITH LEFT CUTANEOUS URETEROSTOMY EXTENSIVE ADHESIOLYSIS;  Surgeon: [name], MD;  [facility];  Service: Urology;  Laterality: N/A;  . ROBOT ASSISTED LAPAROSCOPIC RADICAL PROSTATECTOMY N/A 06/16/2015   Procedure: XI ROBOTIC ASSISTED LAPAROSCOPIC RADICAL PROSTATECTOMY;  Surgeon: [name], MD;  [facility];  Service: Urology;  Laterality: N/A;  . ROBOT ASSITED LAPAROSCOPIC NEPHROURETERECTOMY Right 05/27/2014   Procedure: ROBOT ASSITED LAPAROSCOPIC NEPHROURETERECTOMY;  Surgeon: [name], MD;  [facility];  Service: Urology;  Laterality: Right;  . TRANSURETHRAL RESECTION OF BLADDER TUMOR WITH GYRUS (TURBT-GYRUS) N/A 03/31/2015   Procedure: TRANSURETHRAL RESECTION OF BLADDER TUMOR WITH GYRUS (TURBT-GYRUS);  Surgeon: [name], MD;  Location: [facility];  Service: Urology;  Laterality: N/A;  . TRANSURETHRAL RESECTION OF BLADDER TUMOR WITH GYRUS (TURBT-GYRUS) N/A 05/10/2015   Procedure: TRANSURETHRAL RESECTION OF BLADDER TUMOR WITH GYRUS (TURBT-GYRUS);  Surgeon: [name], MD;  Location: [facility];  Service: Urology;  Laterality: N/A;        Home Medications    Prior to Admission medications   Medication Sig Start Date End Date Taking? Authorizing Provider  artificial tears (LACRILUBE) OINT ophthalmic  ointment Place into right eye every 2 hours until symptoms improve 10/31/18   Jola Schmidt, MD  predniSONE (DELTASONE) 20 MG tablet Take 2 tablets (40 mg total) by mouth daily for 4 days. 10/31/18 11/04/18  Jola Schmidt, MD  valACYclovir (VALTREX) 1000 MG tablet Take 1 tablet (1,000 mg total) by mouth 3 (three) times daily. 10/31/18   Jola Schmidt, MD    Family History Family History  Problem Relation Age of Onset  . Breast cancer Mother   . HIV/AIDS Father     Social History Social History   Tobacco Use  . Smoking status: Former Smoker    Packs/day: 0.50    Years: 20.00    Pack years: 10.00    Types: Cigarettes  . Smokeless tobacco: Never Used  Substance Use Topics  . Alcohol use: Yes    Comment: rare  . Drug use: No     Allergies   Valium   Review of Systems Review of Systems  All other systems reviewed and are negative.    Physical Exam Updated Vital Signs BP (!) 149/100 (BP Location: Right Arm)   Pulse 81   Temp 98.4 F (36.9 C) (Oral)   Resp 17   SpO2 98%   Physical Exam Vitals signs and nursing note reviewed.  Constitutional:      Appearance: He is well-developed.  HENT:     Head: Normocephalic.     Comments: Right-sided facial droop.  Weakness of the muscles of the right forehead consistent with Bell's palsy Neck:     Musculoskeletal: Normal range of motion.  Pulmonary:     Effort: Pulmonary effort is normal.  Abdominal:     General: There is no distension.  Musculoskeletal: Normal range of motion.  Skin:    General: Skin is warm.  Neurological:     Mental Status: He is alert and oriented to person, place, and time.     Comments: Normal strength in arms and legs bilaterally.  Speech normal.      ED Treatments / Results  Labs (all labs ordered are listed, but only abnormal results are displayed) Labs Reviewed - No data to display  EKG None  Radiology No results found.  Procedures Procedures (including critical care time)   Medications Ordered in ED Medications - No data to display   Initial Impression / Assessment and Plan / ED Course  I have reviewed the triage vital signs and the nursing notes.  Pertinent labs & imaging results that were available during my care of the patient were reviewed by me and considered in my medical decision making (see chart for details).        Bell's palsy.  Ophthalmic precautions given.  Home with standard medications.  Pathophysiology of Bell's palsy described.  All questions answered.  Discharged home in good condition  Final Clinical Impressions(s) / ED Diagnoses   Final diagnoses:  Right-sided Bell's palsy    ED Discharge Orders         Ordered    predniSONE (DELTASONE) 20 MG tablet  Daily     10/31/18 1551    valACYclovir (VALTREX) 1000 MG tablet  3 times daily     10/31/18 1551    artificial tears (LACRILUBE) OINT  ophthalmic ointment     10/31/18 1551           Jola Schmidt, MD 10/31/18 1600

## 2018-11-04 DIAGNOSIS — G510 Bell's palsy: Secondary | ICD-10-CM | POA: Diagnosis not present

## 2018-11-20 DIAGNOSIS — Z936 Other artificial openings of urinary tract status: Secondary | ICD-10-CM | POA: Diagnosis not present

## 2018-12-18 DIAGNOSIS — Z936 Other artificial openings of urinary tract status: Secondary | ICD-10-CM | POA: Diagnosis not present

## 2018-12-30 DIAGNOSIS — C678 Malignant neoplasm of overlapping sites of bladder: Secondary | ICD-10-CM | POA: Diagnosis not present

## 2018-12-30 DIAGNOSIS — C61 Malignant neoplasm of prostate: Secondary | ICD-10-CM | POA: Diagnosis not present

## 2019-01-01 DIAGNOSIS — C678 Malignant neoplasm of overlapping sites of bladder: Secondary | ICD-10-CM | POA: Diagnosis not present

## 2019-01-04 DIAGNOSIS — C678 Malignant neoplasm of overlapping sites of bladder: Secondary | ICD-10-CM | POA: Diagnosis not present

## 2019-01-04 DIAGNOSIS — N200 Calculus of kidney: Secondary | ICD-10-CM | POA: Diagnosis not present

## 2019-01-04 DIAGNOSIS — C61 Malignant neoplasm of prostate: Secondary | ICD-10-CM | POA: Diagnosis not present

## 2019-01-04 DIAGNOSIS — N5232 Erectile dysfunction following radical cystectomy: Secondary | ICD-10-CM | POA: Diagnosis not present

## 2019-01-19 DIAGNOSIS — Z936 Other artificial openings of urinary tract status: Secondary | ICD-10-CM | POA: Diagnosis not present

## 2019-02-16 DIAGNOSIS — Z936 Other artificial openings of urinary tract status: Secondary | ICD-10-CM | POA: Diagnosis not present

## 2019-03-18 DIAGNOSIS — Z936 Other artificial openings of urinary tract status: Secondary | ICD-10-CM | POA: Diagnosis not present

## 2019-04-21 DIAGNOSIS — Z936 Other artificial openings of urinary tract status: Secondary | ICD-10-CM | POA: Diagnosis not present

## 2019-05-18 DIAGNOSIS — Z936 Other artificial openings of urinary tract status: Secondary | ICD-10-CM | POA: Diagnosis not present

## 2019-06-17 DIAGNOSIS — Z936 Other artificial openings of urinary tract status: Secondary | ICD-10-CM | POA: Diagnosis not present

## 2019-07-16 DIAGNOSIS — Z936 Other artificial openings of urinary tract status: Secondary | ICD-10-CM | POA: Diagnosis not present

## 2019-08-04 DIAGNOSIS — Z125 Encounter for screening for malignant neoplasm of prostate: Secondary | ICD-10-CM | POA: Diagnosis not present

## 2019-08-04 DIAGNOSIS — E782 Mixed hyperlipidemia: Secondary | ICD-10-CM | POA: Diagnosis not present

## 2019-08-04 DIAGNOSIS — Z905 Acquired absence of kidney: Secondary | ICD-10-CM | POA: Diagnosis not present

## 2019-08-04 DIAGNOSIS — Z85528 Personal history of other malignant neoplasm of kidney: Secondary | ICD-10-CM | POA: Diagnosis not present

## 2019-08-04 DIAGNOSIS — Z0000 Encounter for general adult medical examination without abnormal findings: Secondary | ICD-10-CM | POA: Diagnosis not present

## 2019-08-13 DIAGNOSIS — Z936 Other artificial openings of urinary tract status: Secondary | ICD-10-CM | POA: Diagnosis not present

## 2019-09-09 DIAGNOSIS — Z936 Other artificial openings of urinary tract status: Secondary | ICD-10-CM | POA: Diagnosis not present

## 2019-09-29 ENCOUNTER — Encounter (HOSPITAL_COMMUNITY): Payer: Self-pay

## 2019-09-29 ENCOUNTER — Emergency Department (HOSPITAL_COMMUNITY)
Admission: EM | Admit: 2019-09-29 | Discharge: 2019-09-29 | Disposition: A | Discharge status: Home / Self Care | Payer: BC Managed Care – PPO | Attending: Emergency Medicine | Admitting: Emergency Medicine

## 2019-09-29 ENCOUNTER — Other Ambulatory Visit: Payer: Self-pay

## 2019-09-29 ENCOUNTER — Emergency Department (HOSPITAL_COMMUNITY): Payer: BC Managed Care – PPO

## 2019-09-29 DIAGNOSIS — N189 Chronic kidney disease, unspecified: Secondary | ICD-10-CM | POA: Insufficient documentation

## 2019-09-29 DIAGNOSIS — I129 Hypertensive chronic kidney disease with stage 1 through stage 4 chronic kidney disease, or unspecified chronic kidney disease: Secondary | ICD-10-CM | POA: Diagnosis not present

## 2019-09-29 DIAGNOSIS — J069 Acute upper respiratory infection, unspecified: Secondary | ICD-10-CM

## 2019-09-29 DIAGNOSIS — J029 Acute pharyngitis, unspecified: Secondary | ICD-10-CM | POA: Diagnosis not present

## 2019-09-29 DIAGNOSIS — Z79899 Other long term (current) drug therapy: Secondary | ICD-10-CM | POA: Diagnosis not present

## 2019-09-29 DIAGNOSIS — R05 Cough: Secondary | ICD-10-CM | POA: Diagnosis not present

## 2019-09-29 DIAGNOSIS — B9789 Other viral agents as the cause of diseases classified elsewhere: Secondary | ICD-10-CM | POA: Diagnosis not present

## 2019-09-29 DIAGNOSIS — Z87891 Personal history of nicotine dependence: Secondary | ICD-10-CM | POA: Insufficient documentation

## 2019-09-29 DIAGNOSIS — R0981 Nasal congestion: Secondary | ICD-10-CM | POA: Diagnosis not present

## 2019-09-29 MED ORDER — PREDNISONE 10 MG PO TABS: 30.0000 mg | ORAL_TABLET | Freq: Every day | ORAL | 0 refills | Status: AC

## 2019-09-29 MED ORDER — ALBUTEROL SULFATE HFA 108 (90 BASE) MCG/ACT IN AERS: 1.0000 | INHALATION_SPRAY | Freq: Four times a day (QID) | RESPIRATORY_TRACT | 0 refills | Status: AC | PRN

## 2019-09-29 NOTE — Discharge Instructions (Addendum)
Per our discussion, I am going to prescribe you 2 medications.  The first medication is an albuterol inhaler. You can use this as needed for coughing and wheezing.  I will give you a short course of oral steroids. Please take these in the morning for the next 3 days. These can have a stimulating effect, so do not take them later in the day as they can result in difficulty sleeping.  Please asked the pharmacist for a cough suppressant that has the active ingredient called "dextromethorphan". They should be able to find many options.  If your symptoms worsen please return to the emergency department.  It was a pleasure to meet you.

## 2019-09-29 NOTE — ED Triage Notes (Signed)
Patient c/o sore throat, nasal congestion, and runny nose.   Patient denies shob at this time and able to speak in complete sentence with no issues.     A/Ox4 Ambulatory in triage.

## 2019-09-29 NOTE — ED Provider Notes (Signed)
[place] DEPT Provider Note   CSN: [id number] Arrival date & time: 09/29/19  0930     History Chief Complaint  Patient presents with  . Nasal Congestion    Timothy Hickman is a 49 y.o. male.  HPI 5 days ago patient began experiencing mild sore throat as well as nasal congestion.  He states over the last 1 to 2 days his sore throat is mostly alleviated but is now experiencing worsening nasal congestion that he states is "yellow".  He states he has been coughing intermittently as well and notes that his cough has become productive in the last 1 to 2 days.  No chest pain or shortness of breath.  He notes a smoking history that was 2 packs/day but quit smoking cigarettes 3 years ago.  He now only "vapes", which he does periodically throughout the day.  His coughing seems to be worst at night.  Cough drops help significantly with the frequency of his coughing bouts.  He has not taken any other medications for his sx. He denies fevers, chills, abdominal pain, nausea, vomiting, diarrhea constipation, urinary changes, syncope.      Past Medical History:  Diagnosis Date  . Bladder cancer ([place])   . Borderline hypertension   . Chronic kidney disease   . History of cardiac murmur as a child   . 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 Solitary left kidney   . Urinary hesitancy     Patient Active Problem List   Diagnosis Date Noted  . Bladder cancer ([place]) 03/31/2015  . Perinephric hematoma 05/06/2014  . Leukocytosis 05/06/2014  . Accelerated hypertension 05/06/2014  . Right flank pain 05/06/2014  . Renal mass     Past Surgical History:  Procedure Laterality Date  . CYSTOSCOPY W/ URETERAL STENT PLACEMENT Right 05/07/2014   Procedure: CYSTOSCOPY WITH RETROGRADE PYELOGRAM/RIGHT  DIAGNOSTIC URETEROSCOPY, RIGHT URETERAL STENT PLACEMENT;  Surgeon: Raynelle Bring, MD;  Location: [facility];  Service: Urology;  Laterality: Right;  . CYSTOSCOPY WITH INJECTION N/A 06/16/2015   Procedure: CYSTOSCOPY WITH INJECTION OF INDOCYANINE GREEN DYE;  Surgeon: [name], MD;  Location: [facility];  Service: Urology;  Laterality: N/A;  . CYSTOSCOPY WITH URETEROSCOPY Right 05/05/2014   Procedure: CYSTOSCOPY/ RIGHT URETEROSCOPY/ RIGHT RETROGRADE PYLEGRAM WITH INTERPRETATION/ RIGHT URETERAL BALLOON DILATION OF UPJ OBSTRUCTION/ BIOPSY OF RIGHT RENAL PELVIC MASS;  Surgeon: Ailene Rud, MD;  Location: [facility];  Service: Urology;  Laterality: Right;  . ROBOT ASSISTED LAPAROSCOPIC COMPLETE CYSTECT ILEAL CONDUIT N/A 06/16/2015   Procedure: XI ROBOTIC ASSISTED LAPAROSCOPIC COMPLETE CYSTECT WITH LEFT CUTANEOUS URETEROSTOMY EXTENSIVE ADHESIOLYSIS;  Surgeon: [name], MD;  Location: [facility];  Service: Urology;  Laterality: N/A;  . ROBOT ASSISTED LAPAROSCOPIC RADICAL PROSTATECTOMY N/A 06/16/2015   Procedure: XI ROBOTIC ASSISTED LAPAROSCOPIC RADICAL PROSTATECTOMY;  Surgeon: [name], MD;  Location: [facility];  Service: Urology;  Laterality: N/A;  . ROBOT ASSITED LAPAROSCOPIC NEPHROURETERECTOMY Right 05/27/2014   Procedure: ROBOT ASSITED LAPAROSCOPIC NEPHROURETERECTOMY;  Surgeon: [name], MD;  Location: [facility];  Service: Urology;  Laterality: Right;  . TRANSURETHRAL RESECTION OF BLADDER TUMOR WITH GYRUS (TURBT-GYRUS) N/A 03/31/2015   Procedure: TRANSURETHRAL RESECTION OF BLADDER TUMOR WITH GYRUS (TURBT-GYRUS);  Surgeon: [name], MD;  Location: [facility];  Service: Urology;  Laterality: N/A;  .  TRANSURETHRAL RESECTION OF BLADDER TUMOR WITH GYRUS (TURBT-GYRUS) N/A 05/10/2015   Procedure: TRANSURETHRAL RESECTION OF BLADDER TUMOR WITH GYRUS (TURBT-GYRUS);  Surgeon: [name], MD;  Location: [facility];  Service: Urology;  Laterality: N/A;        Family History  Problem Relation Age of Onset  . Breast cancer Mother   . HIV/AIDS Father     Social History   Tobacco Use  . Smoking status: Former Smoker    Packs/day: 0.50    Years: 20.00    Pack years: 10.00    Types: Cigarettes  . Smokeless tobacco: Never Used  Vaping Use  . Vaping Use: Every day  . Substances: Nicotine  Substance Use Topics  . Alcohol use: Yes    Comment: rare  . Drug use: No   Home Medications Prior to Admission medications   Medication Sig Start Date End Date Taking? Authorizing Provider  artificial tears (LACRILUBE) OINT ophthalmic ointment Place into right eye every 2 hours until symptoms improve 10/31/18   Jola Schmidt, MD  valACYclovir (VALTREX) 1000 MG tablet Take 1 tablet (1,000 mg total) by mouth 3 (three) times daily. 10/31/18   Jola Schmidt, MD    Allergies    Valium  Review of Systems   Review of Systems  Constitutional: Negative for chills and fever.  HENT: Positive for congestion, postnasal drip, rhinorrhea and sore throat. Negative for ear discharge and ear pain.   Respiratory: Positive for cough. Negative for chest tightness, shortness of breath and wheezing.   Cardiovascular: Negative for chest pain and leg swelling.  Gastrointestinal: Negative for abdominal pain, diarrhea, nausea and vomiting.  Genitourinary: Negative for difficulty urinating and frequency.   Physical Exam Updated Vital Signs BP (!) 145/95 (BP Location: Left Arm)   Pulse 86   Temp 98.2 F (36.8 C) (Oral)   Resp 16   SpO2 95%   Physical Exam Vitals and nursing note reviewed.  Constitutional:      General: He is not in acute distress.    Appearance: Normal appearance. He is obese. He is not ill-appearing, toxic-appearing or diaphoretic.  HENT:     Head: Normocephalic and atraumatic.     Right Ear: External ear normal. There is impacted cerumen.     Left Ear: External ear normal. There is impacted cerumen.     Nose: Congestion present. No  rhinorrhea.     Mouth/Throat:     Mouth: Mucous membranes are moist.     Pharynx: Oropharynx is clear. No oropharyngeal exudate or posterior oropharyngeal erythema.  Eyes:     General: No scleral icterus.       Right eye: No discharge.        Left eye: No discharge.     Extraocular Movements: Extraocular movements intact.     Conjunctiva/sclera: Conjunctivae normal.  Cardiovascular:     Rate and Rhythm: Normal rate and regular rhythm.     Pulses: Normal pulses.     Heart sounds: Normal heart sounds. No murmur heard.  No friction rub. No gallop.   Pulmonary:     Effort: Pulmonary effort is normal. No respiratory distress.     Breath sounds: Normal breath sounds. No stridor. No wheezing, rhonchi or rales.  Abdominal:     General: Abdomen is flat.     Palpations: Abdomen is soft.     Tenderness: There is no abdominal tenderness.  Musculoskeletal:        General: Normal range of motion.  Cervical back: Normal range of motion and neck supple. No tenderness.  Skin:    General: Skin is warm and dry.  Neurological:     General: No focal deficit present.     Mental Status: He is alert and oriented to person, place, and time.  Psychiatric:        Mood and Affect: Mood normal.        Behavior: Behavior normal.    ED Results / Procedures / Treatments   Labs (all labs ordered are listed, but only abnormal results are displayed) Labs Reviewed - No data to display  EKG None  Radiology DG Chest Portable 1 View  Result Date: 09/29/2019 CLINICAL DATA:  Cough and congestion EXAM: PORTABLE CHEST 1 VIEW COMPARISON:  December 26, 2017 FINDINGS: The lungs are clear. The heart size and pulmonary vascularity are normal. No adenopathy. No bone lesions. IMPRESSION: Lungs clear.  Cardiac silhouette normal. Electronically Signed   By: Lowella Grip III M.D.   On: 09/29/2019 11:11    Procedures Procedures (including critical care time)  Medications Ordered in ED Medications - No data  to display  ED Course  I have reviewed the triage vital signs and the nursing notes.  Pertinent labs & imaging results that were available during my care of the patient were reviewed by me and considered in my medical decision making (see chart for details).    MDM Rules/Calculators/A&P                          Patient is a 49 year old male who presents with multiple complaints including cough, congestion, sore throat.  Physical exam is reassuring.  His lungs are clear to auscultation bilaterally.  He is not tachycardic.  He is slightly hypertensive which seems to be his baseline when looking at records.  Due to his medical history as well as age decided to obtain a chest x-ray which was negative.  I discussed this with the patient and this likely being viral URI.  He has not been vaccinated for COVID-19 and states that he wants to speak to his oncologist before doing so.  In the past he states that he has been given azithromycin as well as albuterol with significant relief.  I discussed not moving forward with antibiotics due to no verifiable pneumonia on chest x-ray and the length of his sx not being indicative of bacterial sinusitis.  He was amenable with this.  I discharged the patient with prescription for albuterol as well as a prednisone burst.  I recommended OTC cough medications.  He was given strict return precautions.  He verbalized understanding the above plan.  His questions were answered.  He was amicable at the time of discharge.  His vital signs are stable.  Patient discharged to home/self care.  Condition at discharge: Stable  Note: Portions of this report may have been transcribed using voice recognition software. Every effort was made to ensure accuracy; however, inadvertent computerized transcription errors may be present.    Final Clinical Impression(s) / ED Diagnoses Final diagnoses:  Viral upper respiratory tract infection   Rx / DC Orders ED Discharge Orders          Ordered    predniSONE (DELTASONE) 10 MG tablet  Daily with breakfast     Discontinue  Reprint     09/29/19 1134    albuterol (VENTOLIN HFA) 108 (90 Base) MCG/ACT inhaler  Every 6 hours PRN     Discontinue  Reprint     09/29/19 1134           Rayna Sexton, PA-C 09/29/19 1209    Margette Fast, MD 09/30/19 [phone]

## 2019-10-08 DIAGNOSIS — Z936 Other artificial openings of urinary tract status: Secondary | ICD-10-CM | POA: Diagnosis not present

## 2019-11-09 DIAGNOSIS — Z936 Other artificial openings of urinary tract status: Secondary | ICD-10-CM | POA: Diagnosis not present

## 2019-12-08 DIAGNOSIS — Z936 Other artificial openings of urinary tract status: Secondary | ICD-10-CM | POA: Diagnosis not present

## 2020-01-06 DIAGNOSIS — Z936 Other artificial openings of urinary tract status: Secondary | ICD-10-CM | POA: Diagnosis not present

## 2020-02-04 DIAGNOSIS — Z936 Other artificial openings of urinary tract status: Secondary | ICD-10-CM | POA: Diagnosis not present

## 2020-05-18 ENCOUNTER — Emergency Department (HOSPITAL_COMMUNITY)
Admission: EM | Admit: 2020-05-18 | Discharge: 2020-05-18 | Disposition: A | Discharge status: Home / Self Care | Payer: HRSA Program | Attending: Emergency Medicine | Admitting: Emergency Medicine

## 2020-05-18 ENCOUNTER — Encounter (HOSPITAL_COMMUNITY): Payer: Self-pay

## 2020-05-18 DIAGNOSIS — R070 Pain in throat: Secondary | ICD-10-CM | POA: Diagnosis present

## 2020-05-18 DIAGNOSIS — J029 Acute pharyngitis, unspecified: Secondary | ICD-10-CM | POA: Insufficient documentation

## 2020-05-18 DIAGNOSIS — Z8551 Personal history of malignant neoplasm of bladder: Secondary | ICD-10-CM | POA: Diagnosis not present

## 2020-05-18 DIAGNOSIS — Z8553 Personal history of malignant neoplasm of renal pelvis: Secondary | ICD-10-CM | POA: Insufficient documentation

## 2020-05-18 DIAGNOSIS — U071 COVID-19: Secondary | ICD-10-CM | POA: Diagnosis not present

## 2020-05-18 DIAGNOSIS — N189 Chronic kidney disease, unspecified: Secondary | ICD-10-CM | POA: Diagnosis not present

## 2020-05-18 DIAGNOSIS — M791 Myalgia, unspecified site: Secondary | ICD-10-CM | POA: Diagnosis not present

## 2020-05-18 DIAGNOSIS — I129 Hypertensive chronic kidney disease with stage 1 through stage 4 chronic kidney disease, or unspecified chronic kidney disease: Secondary | ICD-10-CM | POA: Diagnosis not present

## 2020-05-18 DIAGNOSIS — Z87891 Personal history of nicotine dependence: Secondary | ICD-10-CM | POA: Insufficient documentation

## 2020-05-18 LAB — SARS CORONAVIRUS 2 (TAT 6-24 HRS): SARS Coronavirus 2: POSITIVE — AB

## 2020-05-18 NOTE — ED Triage Notes (Signed)
Pt complains of a sore throat and lower back and elbow pain for two days, pt does have a fever in triage

## 2020-05-18 NOTE — ED Provider Notes (Signed)
[place] DEPT Provider Note   CSN: [id number] Arrival date & time: 05/18/20  0556     History No chief complaint on file.   Timothy Hickman is a 50 y.o. male.  50 year old male with past medical history of bladder cancer, hypertension, chronic kidney disease, former smoker presents with complaint of body aches and sore throat.  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  No known direct Covid exposure, is not vaccinated against COVID-19.  No other complaints or concerns.  Timothy Hickman was evaluated in Emergency Department on 05/18/2020 for the symptoms described in the history of present illness. He was evaluated in the context of the global COVID-19 pandemic, which necessitated consideration that the patient might be at risk for infection with the SARS-CoV-2 virus that causes COVID-19. Institutional protocols and algorithms that pertain to the evaluation of patients at risk for COVID-19 are in a state of rapid change based on information released by regulatory bodies including the CDC and federal and state organizations. These policies and algorithms were followed during the patient's care in the ED.         Past Medical History:  Diagnosis Date  . Bladder cancer ([place])   . Borderline hypertension   . Chronic kidney disease   . History of cardiac murmur as a child   . 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 Solitary left kidney   . Urinary hesitancy     Patient Active Problem List   Diagnosis Date Noted  . Bladder cancer ([place]) 03/31/2015  . Perinephric hematoma 05/06/2014  .  Leukocytosis 05/06/2014  . Accelerated hypertension 05/06/2014  . Right flank pain 05/06/2014  . Renal mass     Past Surgical History:  Procedure Laterality Date  . CYSTOSCOPY W/ URETERAL STENT PLACEMENT Right 05/07/2014   Procedure: CYSTOSCOPY WITH RETROGRADE PYELOGRAM/RIGHT DIAGNOSTIC URETEROSCOPY, RIGHT URETERAL STENT PLACEMENT;  Surgeon: Raynelle Bring, MD;  [facility];  Service: Urology;  Laterality: Right;  . CYSTOSCOPY WITH INJECTION N/A 06/16/2015   Procedure: CYSTOSCOPY WITH INJECTION OF INDOCYANINE GREEN DYE;  Surgeon: [name], MD;  [facility];  Service: Urology;  Laterality: N/A;  . CYSTOSCOPY WITH URETEROSCOPY Right 05/05/2014   Procedure: CYSTOSCOPY/ RIGHT URETEROSCOPY/ RIGHT RETROGRADE PYLEGRAM WITH INTERPRETATION/ RIGHT URETERAL BALLOON DILATION OF UPJ OBSTRUCTION/ BIOPSY OF RIGHT RENAL PELVIC MASS;  Surgeon: Ailene Rud, MD;  Location: [facility];  Service: Urology;  Laterality: Right;  . ROBOT ASSISTED LAPAROSCOPIC COMPLETE CYSTECT ILEAL CONDUIT N/A 06/16/2015   Procedure: XI ROBOTIC ASSISTED LAPAROSCOPIC COMPLETE CYSTECT WITH LEFT CUTANEOUS URETEROSTOMY EXTENSIVE ADHESIOLYSIS;  Surgeon: [name], MD;  [facility];  Service: Urology;  Laterality: N/A;  . ROBOT ASSISTED LAPAROSCOPIC RADICAL PROSTATECTOMY N/A 06/16/2015   Procedure: XI ROBOTIC ASSISTED LAPAROSCOPIC RADICAL PROSTATECTOMY;  Surgeon: [name], MD;  Location[facility];  Service: Urology;  Laterality: N/A;  . ROBOT ASSITED LAPAROSCOPIC NEPHROURETERECTOMY Right 05/27/2014   Procedure: ROBOT ASSITED LAPAROSCOPIC NEPHROURETERECTOMY;  Surgeon: [name], MD;  [facility];  Service: Urology;  Laterality: Right;  . TRANSURETHRAL RESECTION OF BLADDER TUMOR WITH GYRUS (  TURBT-GYRUS) N/A 03/31/2015   Procedure: TRANSURETHRAL RESECTION OF BLADDER TUMOR WITH GYRUS (TURBT-GYRUS);  Surgeon: [name], MD;  Location: [facility];  Service: Urology;   Laterality: N/A;  . TRANSURETHRAL RESECTION OF BLADDER TUMOR WITH GYRUS (TURBT-GYRUS) N/A 05/10/2015   Procedure: TRANSURETHRAL RESECTION OF BLADDER TUMOR WITH GYRUS (TURBT-GYRUS);  Surgeon: [name], MD;  Location: [facility];  Service: Urology;  Laterality: N/A;       Family History  Problem Relation Age of Onset  . Breast cancer Mother   . HIV/AIDS Father     Social History   Tobacco Use  . Smoking status: Former Smoker    Packs/day: 0.50    Years: 20.00    Pack years: 10.00    Types: Cigarettes  . Smokeless tobacco: Never Used  Vaping Use  . Vaping Use: Every day  . Substances: Nicotine  Substance Use Topics  . Alcohol use: Yes    Comment: rare  . Drug use: No    Home Medications Prior to Admission medications   Medication Sig Start Date End Date Taking? Authorizing Provider  albuterol (VENTOLIN HFA) 108 (90 Base) MCG/ACT inhaler Inhale 1-2 puffs into the lungs every 6 (six) hours as needed for wheezing or shortness of breath. 09/29/19   Rayna Sexton, PA-C  artificial tears (LACRILUBE) OINT ophthalmic ointment Place into right eye every 2 hours until symptoms improve 10/31/18   Jola Schmidt, MD  valACYclovir (VALTREX) 1000 MG tablet Take 1 tablet (1,000 mg total) by mouth 3 (three) times daily. 10/31/18   Jola Schmidt, MD    Allergies    Valium  Review of Systems   Review of Systems  Constitutional: Negative for chills and fever.  HENT: Positive for sore throat. Negative for congestion.   Respiratory: Negative for cough, shortness of breath and wheezing.   Gastrointestinal: Negative for nausea and vomiting.  Musculoskeletal: Positive for arthralgias and myalgias.  Skin: Negative for rash and wound.  Allergic/Immunologic: Negative for immunocompromised state.  Neurological: Negative for weakness.  Hematological: Negative for adenopathy.  All other systems reviewed and are negative.   Physical Exam Updated Vital Signs BP (!) 143/86    Pulse 91   Temp 100.2 F (37.9 C) (Oral)   Resp 16   Ht 5\' 7"  (1.702 m)   Wt 102.5 kg   SpO2 97%   BMI 35.40 kg/m   Physical Exam Vitals and nursing note reviewed.  Constitutional:      General: He is not in acute distress.    Appearance: He is well-developed and well-nourished. He is not diaphoretic.  HENT:     Head: Normocephalic and atraumatic.     Nose: Nose normal.     Mouth/Throat:     Mouth: Mucous membranes are moist.     Pharynx: No oropharyngeal exudate or posterior oropharyngeal erythema.  Eyes:     Conjunctiva/sclera: Conjunctivae normal.  Cardiovascular:     Rate and Rhythm: Normal rate and regular rhythm.     Pulses: Normal pulses.     Heart sounds: Normal heart sounds.  Pulmonary:     Effort: Pulmonary effort is normal.     Breath sounds: Normal breath sounds.  Musculoskeletal:     Cervical back: Neck supple.  Lymphadenopathy:     Cervical: No cervical adenopathy.  Skin:    General: Skin is warm and dry.     Findings: No erythema or rash.  Neurological:     Mental Status: He is alert and oriented  to person, place, and time.  Psychiatric:        Mood and Affect: Mood and affect normal.        Behavior: Behavior normal.     ED Results / Procedures / Treatments   Labs (all labs ordered are listed, but only abnormal results are displayed) Labs Reviewed  SARS CORONAVIRUS 2 (TAT 6-24 HRS)    EKG None  Radiology No results found.  Procedures Procedures   Medications Ordered in ED Medications - No data to display  ED Course  I have reviewed the triage vital signs and the nursing notes.  Pertinent labs & imaging results that were available during my care of the patient were reviewed by me and considered in my medical decision making (see chart for details).  Clinical Course as of 05/18/20 0704  Thu May 18, 7513  835 50 year old male with body aches and sore throat onset yesterday with low-grade temp in triage.  Plan is to send out Covid  swab, advised to quarantine pending results.  Recommend Covid vaccine when eligible. [LM]    Clinical Course User Index [LM] Roque Lias   MDM Rules/Calculators/A&P                          Final Clinical Impression(s) / ED Diagnoses Final diagnoses:  Pharyngitis, unspecified etiology  Myalgia    Rx / DC Orders ED Discharge Orders    None       Tacy Learn, PA-C 05/18/20 4315    Margette Fast, MD 05/18/20 1008

## 2020-05-18 NOTE — Discharge Instructions (Signed)
Home to quarantine, follow-up in your MyChart account for your Covid test results.  If your test is negative, consider repeat free test at Covid testing sites including [facility] or local pharmacy. Take Motrin and Tylenol as needed as directed for fever and body aches.  Take Mucinex if you develop cough and congestion. Follow-up with primary care provider if symptoms persist, return to ED for worsening or concerning symptoms.

## 2020-05-19 ENCOUNTER — Telehealth: Payer: Self-pay

## 2020-05-19 NOTE — Telephone Encounter (Signed)
Called to discuss with patient about COVID-19 symptoms and the use of one of the available treatments for those with mild to moderate Covid symptoms and at a high risk of hospitalization.  Pt appears to qualify for outpatient treatment due to co-morbid conditions and/or a member of an at-risk group in accordance with the FDA Emergency Use Authorization.    Symptom onset: 05/17/20 per ED note Vaccinated: Not in chart Booster? Not in chart Immunocompromised? No Qualifiers: HTN,CKD,Bladder cancer  Unable to reach pt - Left message with call back number [phone].   Marcello Moores

## 2020-05-19 NOTE — Telephone Encounter (Signed)
2nd attempt - Was unable to get in touch with Mr. Harty and left a voicemail with the call back number. MyChart message has also been sent.  Terri Piedra, NP 05/19/2020 2:46 PM

## 2022-12-02 ENCOUNTER — Other Ambulatory Visit: Payer: Self-pay

## 2022-12-02 ENCOUNTER — Emergency Department (HOSPITAL_COMMUNITY)
Admission: EM | Admit: 2022-12-02 | Discharge: 2022-12-02 | Disposition: A | Discharge status: Home / Self Care | Payer: Self-pay | Attending: Emergency Medicine | Admitting: Emergency Medicine

## 2022-12-02 ENCOUNTER — Emergency Department (HOSPITAL_COMMUNITY): Payer: Self-pay

## 2022-12-02 ENCOUNTER — Encounter (HOSPITAL_COMMUNITY): Payer: Self-pay

## 2022-12-02 DIAGNOSIS — I129 Hypertensive chronic kidney disease with stage 1 through stage 4 chronic kidney disease, or unspecified chronic kidney disease: Secondary | ICD-10-CM | POA: Insufficient documentation

## 2022-12-02 DIAGNOSIS — R0789 Other chest pain: Secondary | ICD-10-CM | POA: Insufficient documentation

## 2022-12-02 DIAGNOSIS — Z8551 Personal history of malignant neoplasm of bladder: Secondary | ICD-10-CM | POA: Insufficient documentation

## 2022-12-02 DIAGNOSIS — N189 Chronic kidney disease, unspecified: Secondary | ICD-10-CM | POA: Insufficient documentation

## 2022-12-02 LAB — BASIC METABOLIC PANEL
Anion gap: 11 (ref 5–15)
BUN: 15 mg/dL (ref 6–20)
CO2: 26 mmol/L (ref 22–32)
Calcium: 9.2 mg/dL (ref 8.9–10.3)
Chloride: 103 mmol/L (ref 98–111)
Creatinine, Ser: 1.28 mg/dL — ABNORMAL HIGH (ref 0.61–1.24)
GFR, Estimated: >60 mL/min (ref >60)
Glucose, Bld: 94 mg/dL (ref 70–99)
Potassium: 4.1 mmol/L (ref 3.5–5.1)
Sodium: 140 mmol/L (ref 135–145)

## 2022-12-02 LAB — TROPONIN I (HIGH SENSITIVITY)
Troponin I (High Sensitivity): 3 ng/L (ref <18)
Troponin I (High Sensitivity): 3 ng/L (ref <18)

## 2022-12-02 LAB — CBC
HCT: 43.2 % (ref 39.0–52.0)
Hemoglobin: 14.2 g/dL (ref 13.0–17.0)
MCH: 29.2 pg (ref 26.0–34.0)
MCHC: 32.9 g/dL (ref 30.0–36.0)
MCV: 88.7 fL (ref 80.0–100.0)
Platelets: 282 10*3/uL (ref 150–400)
RBC: 4.87 MIL/uL (ref 4.22–5.81)
RDW: 13.5 % (ref 11.5–15.5)
WBC: 9.4 10*3/uL (ref 4.0–10.5)
nRBC: 0 % (ref 0.0–0.2)

## 2022-12-02 NOTE — ED Provider Notes (Signed)
[place] EMERGENCY DEPARTMENT AT [facility] Provider Note   CSN: [id number] Arrival date & time: 12/02/22  1547     History  Chief Complaint  Patient presents with   Chest Pain   HPI Timothy Hickman is a 52 y.o. male with CKD, history of bladder cancer and hypertension presenting for chest pain.  Started 2 days ago.  Said the pain was in center of his chest, feels tight, was nonradiating and nonpleuritic.  States that chest tightness resolved about 24 hours ago.  Denies associated shortness of breath.  States now his chest "feels cool" and he feels like he may have bronchitis.  States he has had a mild cough but no fever.  Denies calf tenderness, recent immobilization.  Reports he has been in remission from his cancer for "several years now".  Not actively receiving chemo.   Chest Pain      Home Medications Prior to Admission medications   Medication Sig Start Date End Date Taking? Authorizing Provider  albuterol (VENTOLIN HFA) 108 (90 Base) MCG/ACT inhaler Inhale 1-2 puffs into the lungs every 6 (six) hours as needed for wheezing or shortness of breath. 09/29/19   Placido Sou, PA-C  artificial tears (LACRILUBE) OINT ophthalmic ointment Place into right eye every 2 hours until symptoms improve 10/31/18   Azalia Bilis, MD  valACYclovir (VALTREX) 1000 MG tablet Take 1 tablet (1,000 mg total) by mouth 3 (three) times daily. 10/31/18   Azalia Bilis, MD      Allergies    Valium    Review of Systems   Review of Systems  Cardiovascular:  Positive for chest pain.    Physical Exam Updated Vital Signs BP (!) 146/106   Pulse 70   Temp 98.1 F (36.7 C) (Oral)   Resp 16   Ht 5\' 7"  (1.702 m)   Wt 104.3 kg   SpO2 99%   BMI 36.02 kg/m  Physical Exam Vitals and nursing note reviewed.  HENT:     Head: Normocephalic and atraumatic.     Mouth/Throat:     Mouth: Mucous membranes are moist.  Eyes:     General:        Right eye: No discharge.        Left eye: No  discharge.     Conjunctiva/sclera: Conjunctivae normal.  Cardiovascular:     Rate and Rhythm: Normal rate and regular rhythm.     Pulses: Normal pulses.     Heart sounds: Normal heart sounds.  Pulmonary:     Effort: Pulmonary effort is normal.     Breath sounds: Normal breath sounds. No decreased breath sounds, wheezing, rhonchi or rales.  Abdominal:     General: Abdomen is flat.     Palpations: Abdomen is soft.  Skin:    General: Skin is warm and dry.  Neurological:     General: No focal deficit present.  Psychiatric:        Mood and Affect: Mood normal.     ED Results / Procedures / Treatments   Labs (all labs ordered are listed, but only abnormal results are displayed) Labs Reviewed  BASIC METABOLIC PANEL - Abnormal; Notable for the following components:      Result Value   Creatinine, Ser 1.28 (*)    All other components within normal limits  CBC  TROPONIN I (HIGH SENSITIVITY)  TROPONIN I (HIGH SENSITIVITY)    EKG EKG Interpretation Date/Time:  Monday December 02 2022 15:54:25 EDT Ventricular Rate:  75 PR  Interval:  176 QRS Duration:  90 QT Interval:  384 QTC Calculation: 429 R Axis:   77  Text Interpretation: Sinus rhythm Baseline wander in lead(s) I II III aVR aVF V4 V5 V6 Confirmed by Glyn Ade [phone]) on 12/02/2022 9:20:15 PM  Radiology DG Chest 2 View  Result Date: 12/02/2022 CLINICAL DATA:  Centralized chest pain beginning 2 days ago. Former smoker. EXAM: CHEST - 2 VIEW COMPARISON:  None available. FINDINGS: Cardiomediastinal silhouette and pulmonary vasculature are within normal limits. Lungs are clear. IMPRESSION: No acute cardiopulmonary process. Electronically Signed   By: Acquanetta Belling M.D.   On: 12/02/2022 17:04    Procedures Procedures    Medications Ordered in ED Medications - No data to display  ED Course/ Medical Decision Making/ A&P Clinical Course as of 12/02/22 2125  Mon Dec 02, 2022  2120 Episode of chest pain 2 days ago. Now  resolved. Hx of bladder cancer currently in remission.  Consider D-dimer, however symptoms are very atypical for pulmonary embolism and patient is well-appearing at this time.  Will refer back to PCP for ongoing outpatient care management. [CC]    Clinical Course User Index [CC] Glyn Ade, MD             HEART Score: 2                    Medical Decision Making Amount and/or Complexity of Data Reviewed Labs: ordered. Radiology: ordered.   Initial Impression and Ddx 52 year old well-appearing male presenting for chest pain.  Exam was unremarkable.  DDx includes ACS, PE, pneumonia, pneumothorax, CHF, reflux. Patient PMH that increases complexity of ED encounter:  CKD, history of bladder cancer and hypertension   Interpretation of Diagnostics I independent reviewed and interpreted the labs as followed: No acute derangements  - I independently visualized the following imaging with scope of interpretation limited to determining acute life threatening conditions related to emergency care: cxr, which revealed no acute cardiopulmonary processes  -I personally reviewed interpret EKG which revealed sinus rhythm  Patient Reassessment and Ultimate Disposition/Management On reassessment, patient continued to deny chest pain and shortness of breath.  Workup was overall reassuring.  Doubt PE given that he remains asymptomatic, normal heart rate, not hypoxic or having trouble breathing.  Workup does not suggest ACS or CHF.  X-ray was negative making pneumonia and pneumothorax unlikely.  Advised patient to follow-up with PCP.  Discussed return precautions.  Vital stable.  Discharged home with condition.  Patient management required discussion with the following services or consulting groups:  None  Complexity of Problems Addressed Acute complicated illness or Injury  Additional Data Reviewed and Analyzed Further history obtained from: Past medical history and medications listed in the EMR  and Prior ED visit notes  Patient Encounter Risk Assessment None         Final Clinical Impression(s) / ED Diagnoses Final diagnoses:  Atypical chest pain    Rx / DC Orders ED Discharge Orders     None         Gareth Eagle, PA-C 12/02/22 2126    Glyn Ade, MD 12/04/22 1510

## 2022-12-02 NOTE — Discharge Instructions (Addendum)
Evaluation for chest pain is overall reassuring.  Recommend follow-up PCP.  If your chest pain worsens, you have shortness of breath, calf tenderness, develop a cough and fever or any other concerning symptom please return emergency department further evaluation.

## 2022-12-02 NOTE — ED Triage Notes (Signed)
Patient began having centralized chest pain that began 2 days ago. No radiation. No nausea or vomiting.

## 2022-12-17 ENCOUNTER — Emergency Department (HOSPITAL_COMMUNITY): Payer: Self-pay

## 2022-12-17 ENCOUNTER — Emergency Department (HOSPITAL_COMMUNITY)
Admission: EM | Admit: 2022-12-17 | Discharge: 2022-12-17 | Disposition: A | Discharge status: Home / Self Care | Payer: Self-pay | Attending: Emergency Medicine | Admitting: Emergency Medicine

## 2022-12-17 ENCOUNTER — Other Ambulatory Visit: Payer: Self-pay

## 2022-12-17 ENCOUNTER — Encounter (HOSPITAL_COMMUNITY): Payer: Self-pay

## 2022-12-17 DIAGNOSIS — I129 Hypertensive chronic kidney disease with stage 1 through stage 4 chronic kidney disease, or unspecified chronic kidney disease: Secondary | ICD-10-CM | POA: Insufficient documentation

## 2022-12-17 DIAGNOSIS — Z8551 Personal history of malignant neoplasm of bladder: Secondary | ICD-10-CM | POA: Insufficient documentation

## 2022-12-17 DIAGNOSIS — R0789 Other chest pain: Secondary | ICD-10-CM | POA: Insufficient documentation

## 2022-12-17 DIAGNOSIS — Z87891 Personal history of nicotine dependence: Secondary | ICD-10-CM | POA: Insufficient documentation

## 2022-12-17 DIAGNOSIS — N189 Chronic kidney disease, unspecified: Secondary | ICD-10-CM | POA: Insufficient documentation

## 2022-12-17 LAB — CBC
HCT: 46.2 % (ref 39.0–52.0)
Hemoglobin: 15.3 g/dL (ref 13.0–17.0)
MCH: 29.4 pg (ref 26.0–34.0)
MCHC: 33.1 g/dL (ref 30.0–36.0)
MCV: 88.8 fL (ref 80.0–100.0)
Platelets: 290 10*3/uL (ref 150–400)
RBC: 5.2 MIL/uL (ref 4.22–5.81)
RDW: 13.4 % (ref 11.5–15.5)
WBC: 8.9 10*3/uL (ref 4.0–10.5)
nRBC: 0 % (ref 0.0–0.2)

## 2022-12-17 LAB — BASIC METABOLIC PANEL
Anion gap: 15 (ref 5–15)
BUN: 12 mg/dL (ref 6–20)
CO2: 25 mmol/L (ref 22–32)
Calcium: 9.3 mg/dL (ref 8.9–10.3)
Chloride: 101 mmol/L (ref 98–111)
Creatinine, Ser: 1 mg/dL (ref 0.61–1.24)
GFR, Estimated: >60 mL/min (ref >60)
Glucose, Bld: 95 mg/dL (ref 70–99)
Potassium: 4.1 mmol/L (ref 3.5–5.1)
Sodium: 141 mmol/L (ref 135–145)

## 2022-12-17 LAB — TROPONIN I (HIGH SENSITIVITY): Troponin I (High Sensitivity): 4 ng/L (ref <18)

## 2022-12-17 NOTE — ED Notes (Signed)
RN at bedside to draw repeat troponin. Provider at bedside and stated pt did not need repeat troponin.

## 2022-12-17 NOTE — ED Notes (Signed)
Pt endorses chest "tightness, like a small child is sitting on my chest." X 1 month post quitting vaping. Pt denies any pain and states feeling of "tightness" comes and goes intermittently. Same denies any ShOB or NV symptoms.

## 2022-12-17 NOTE — ED Triage Notes (Signed)
Pt c/o intermittent central, non radiating chest tightness and "cooling sensation" x 1 month that he says started after he stopped vaping; denies sob, denies diaphoresis, denies nausea; hx bladder cancer, CKD

## 2022-12-17 NOTE — Discharge Instructions (Addendum)
Evaluation for chest pain was overall reassuring.  Recommend you follow-up with your PCP.  If your chest pain returns, you become short of breath or with calf tenderness or any other concerning symptom please return emergency department further evaluation.

## 2022-12-17 NOTE — ED Provider Notes (Signed)
[place] EMERGENCY DEPARTMENT AT [facility] Provider Note   CSN: [id number] Arrival date & time: 12/17/22  1022     History  No chief complaint on file.  HPI Timothy Hickman is a 52 y.o. male with hypertension, CKD, bladder cancer status post transurethral resection of the bladder presenting for chest tightness.  States it has been intermittent and going on for couple of months.  It is located in the center of his chest that is nonradiating and feels like a tightness.  Also mentioned he "cooling sensation".  Denies shortness of breath.  Patient is nonexertional.  Denies fever and cough.  Denies calf tenderness.  Reports a 20-year history of smoking but stated that he quit 1 month ago.  States that this time he is not having any chest pain or discomfort.  HPI     Home Medications Prior to Admission medications   Medication Sig Start Date End Date Taking? Authorizing Provider  albuterol (VENTOLIN HFA) 108 (90 Base) MCG/ACT inhaler Inhale 1-2 puffs into the lungs every 6 (six) hours as needed for wheezing or shortness of breath. 09/29/19   Placido Sou, PA-C  artificial tears (LACRILUBE) OINT ophthalmic ointment Place into right eye every 2 hours until symptoms improve 10/31/18   Azalia Bilis, MD  valACYclovir (VALTREX) 1000 MG tablet Take 1 tablet (1,000 mg total) by mouth 3 (three) times daily. 10/31/18   Azalia Bilis, MD      Allergies    Valium    Review of Systems   See HPI for pertinent positives  Physical Exam Updated Vital Signs BP (!) 160/89   Pulse 71   Temp 98.8 F (37.1 C) (Oral)   Resp 17   SpO2 100%  Physical Exam Vitals and nursing note reviewed.  HENT:     Head: Normocephalic and atraumatic.     Mouth/Throat:     Mouth: Mucous membranes are moist.  Eyes:     General:        Right eye: No discharge.        Left eye: No discharge.     Conjunctiva/sclera: Conjunctivae normal.  Cardiovascular:     Rate and Rhythm: Normal rate and regular  rhythm.     Pulses: Normal pulses.     Heart sounds: Normal heart sounds.  Pulmonary:     Effort: Pulmonary effort is normal.     Breath sounds: Normal breath sounds.  Abdominal:     General: Abdomen is flat.     Palpations: Abdomen is soft.  Skin:    General: Skin is warm and dry.  Neurological:     General: No focal deficit present.  Psychiatric:        Mood and Affect: Mood normal.     ED Results / Procedures / Treatments   Labs (all labs ordered are listed, but only abnormal results are displayed) Labs Reviewed  BASIC METABOLIC PANEL  CBC  TROPONIN I (HIGH SENSITIVITY)  TROPONIN I (HIGH SENSITIVITY)    EKG None  Radiology DG Chest 2 View  Result Date: 12/17/2022 CLINICAL DATA:  Chest pain. EXAM: CHEST - 2 VIEW COMPARISON:  December 02, 2022. FINDINGS: The heart size and mediastinal contours are within normal limits. Both lungs are clear. The visualized skeletal structures are unremarkable. IMPRESSION: No active cardiopulmonary disease. Electronically Signed   By: Lupita Raider M.D.   On: 12/17/2022 12:12    Procedures Procedures    Medications Ordered in ED Medications - No data to display  ED Course/ Medical Decision Making/ A&P             HEART Score: 2                    Medical Decision Making Amount and/or Complexity of Data Reviewed Labs: ordered. Radiology: ordered.   Initial Impression and Ddx 52 year old well-appearing male presenting for chest pain.  Exam was unremarkable.  DDx includes ACS, PE, pneumonia, pneumothorax, COPD exacerbation, CHF exacerbation, aortic dissection. Patient PMH that increases complexity of ED encounter:  hypertension, CKD, bladder cancer status post transurethral resection of the bladder   Interpretation of Diagnostics - I independent reviewed and interpreted the labs as followed: no acute findings  - I independently visualized the following imaging with scope of interpretation limited to determining acute life  threatening conditions related to emergency care: CXR, which revealed no acute findings  -I personally reviewed and interpreted EKG which revealed normal sinus rhythm  Patient Reassessment and Ultimate Disposition/Management Overall remains well and continues without chest pain or discomfort.  Doubt ACS given no chest pain and reassuring workup.  Doubt PE given no chest pain and not short of breath and lack of risk factors.  Doubt aortic dissection and heart failure as well.  Suspect chronic chest pain could be related to years of smoking.  Advised him to follow-up with his PCP.  Vital stable.  Discussed return precautions.  Discharged home in good condition.  Patient management required discussion with the following services or consulting groups:  None  Complexity of Problems Addressed Acute complicated illness or Injury  Additional Data Reviewed and Analyzed Further history obtained from: Further history from spouse/family member, Past medical history and medications listed in the EMR, and Prior ED visit notes  Patient Encounter Risk Assessment None         Final Clinical Impression(s) / ED Diagnoses Final diagnoses:  Atypical chest pain    Rx / DC Orders ED Discharge Orders     None         Gareth Eagle, PA-C 12/17/22 1250    Benjiman Core, MD 12/18/22 [phone]

## 2024-01-11 ENCOUNTER — Emergency Department (HOSPITAL_COMMUNITY)
Admission: EM | Admit: 2024-01-11 | Discharge: 2024-01-11 | Disposition: A | Discharge status: Home / Self Care | Payer: Self-pay | Attending: Emergency Medicine | Admitting: Emergency Medicine

## 2024-01-11 ENCOUNTER — Emergency Department (HOSPITAL_COMMUNITY): Payer: Self-pay

## 2024-01-11 ENCOUNTER — Other Ambulatory Visit: Payer: Self-pay

## 2024-01-11 ENCOUNTER — Encounter (HOSPITAL_COMMUNITY): Payer: Self-pay

## 2024-01-11 DIAGNOSIS — Z8551 Personal history of malignant neoplasm of bladder: Secondary | ICD-10-CM | POA: Insufficient documentation

## 2024-01-11 DIAGNOSIS — R109 Unspecified abdominal pain: Secondary | ICD-10-CM | POA: Insufficient documentation

## 2024-01-11 LAB — BASIC METABOLIC PANEL WITH GFR
Anion gap: 10 (ref 5–15)
BUN: 11 mg/dL (ref 6–20)
CO2: 22 mmol/L (ref 22–32)
Calcium: 8.6 mg/dL — ABNORMAL LOW (ref 8.9–10.3)
Chloride: 103 mmol/L (ref 98–111)
Creatinine, Ser: 0.88 mg/dL (ref 0.61–1.24)
GFR, Estimated: >60 mL/min (ref >60)
Glucose, Bld: 105 mg/dL — ABNORMAL HIGH (ref 70–99)
Potassium: 4.1 mmol/L (ref 3.5–5.1)
Sodium: 135 mmol/L (ref 135–145)

## 2024-01-11 LAB — CBC
HCT: 43.9 % (ref 39.0–52.0)
Hemoglobin: 14 g/dL (ref 13.0–17.0)
MCH: 28.5 pg (ref 26.0–34.0)
MCHC: 31.9 g/dL (ref 30.0–36.0)
MCV: 89.2 fL (ref 80.0–100.0)
Platelets: 288 K/uL (ref 150–400)
RBC: 4.92 MIL/uL (ref 4.22–5.81)
RDW: 13.5 % (ref 11.5–15.5)
WBC: 7.8 K/uL (ref 4.0–10.5)
nRBC: 0 % (ref 0.0–0.2)

## 2024-01-11 LAB — URINALYSIS, ROUTINE W REFLEX MICROSCOPIC
Bilirubin Urine: NEGATIVE
Glucose, UA: NEGATIVE mg/dL
Hgb urine dipstick: NEGATIVE
Ketones, ur: NEGATIVE mg/dL
Nitrite: NEGATIVE
Protein, ur: NEGATIVE mg/dL
Specific Gravity, Urine: 1.008 (ref 1.005–1.030)
pH: 5 (ref 5.0–8.0)

## 2024-01-11 LAB — TROPONIN T, HIGH SENSITIVITY: Troponin T High Sensitivity: <15 ng/L (ref 0–19)

## 2024-01-11 LAB — D-DIMER, QUANTITATIVE: D-Dimer, Quant: <0.27 ug{FEU}/mL (ref 0.00–0.50)

## 2024-01-11 MED ORDER — IOHEXOL 300 MG/ML  SOLN
100.0000 mL | Freq: Once | INTRAMUSCULAR | Status: AC | PRN
  Administered 2024-01-11: 100 mL via INTRAVENOUS

## 2024-01-11 NOTE — ED Notes (Signed)
 [employer] printer restarted and still did not work.

## 2024-01-11 NOTE — ED Notes (Signed)
 Patient transported to CT

## 2024-01-11 NOTE — ED Triage Notes (Signed)
 Pt arrived from home via POV c/o right sided flank pain 7/10 on pain scale. Hx of bladder and renal cancer.

## 2024-01-11 NOTE — Discharge Instructions (Signed)
 Please use Tylenol  for pain.  You may use 1000 mg of Tylenol  every 6 hours.  Not to exceed 4 g of Tylenol  within 24 hours.  Your workup was reassuring today, there is no abnormality on CT scan, your lab work was benign, your kidney function is stable, and there is no evidence of any cardiac pathology or blood clot that could be causing your symptoms.  I suspect that your symptoms may be from something like a muscle spasm.

## 2024-01-11 NOTE — ED Notes (Signed)
 Pt verbalized understanding of discharge instructions. Opportunity for questions provided.   Pt declined a wheelchair.

## 2024-01-11 NOTE — ED Provider Notes (Signed)
 [place] EMERGENCY DEPARTMENT AT [facility] Provider Note   CSN: [id number] Arrival date & time: 01/11/24  9371     Patient presents with: Flank Pain   Timothy Hickman is a 53 y.o. male with past medical history significant for bladder cancer, solitary left kidney, urostomy who presents with concern for right sided flank pain, he reports that is intermittent, around 4/10 at time of my evaluation, occasionally 7/10.  He denies any fever, chills.  He has not taken anything for pain.  He reports normal appearance of urine other than having slightly dark urine a day or 2 ago.  He reports that he started drinking some more water  and the color of the urine improved.    Flank Pain       Prior to Admission medications   Medication Sig Start Date End Date Taking? Authorizing Provider  albuterol  (VENTOLIN  HFA) 108 (90 Base) MCG/ACT inhaler Inhale 1-2 puffs into the lungs every 6 (six) hours as needed for wheezing or shortness of breath. 09/29/19   Joldersma, Logan, PA-C  artificial tears (LACRILUBE) OINT ophthalmic ointment Place into right eye every 2 hours until symptoms improve 10/31/18   Baxter Drivers, MD  valACYclovir  (VALTREX ) 1000 MG tablet Take 1 tablet (1,000 mg total) by mouth 3 (three) times daily. 10/31/18   Baxter Drivers, MD    Allergies: Valium    Review of Systems  Genitourinary:  Positive for flank pain.  All other systems reviewed and are negative.   Updated Vital Signs BP (!) 138/97   Pulse 60   Temp (!) 97.3 F (36.3 C) (Oral)   Resp 14   Ht 5' 7 (1.702 m)   Wt 106.6 kg   SpO2 100%   BMI 36.81 kg/m   Physical Exam Vitals and nursing note reviewed.  Constitutional:      General: He is not in acute distress.    Appearance: Normal appearance.  HENT:     Head: Normocephalic and atraumatic.  Eyes:     General:        Right eye: No discharge.        Left eye: No discharge.  Cardiovascular:     Rate and Rhythm: Normal rate and regular rhythm.      Heart sounds: No murmur heard.    No friction rub. No gallop.  Pulmonary:     Effort: Pulmonary effort is normal. No respiratory distress.     Breath sounds: Normal breath sounds.  Abdominal:     General: Bowel sounds are normal.     Palpations: Abdomen is soft.     Comments: No significant anterior abdominal tenderness, negative CVA tenderness bilaterally.  Musculoskeletal:        General: No deformity.     Comments: Mild tenderness to palpation of the right serratus muscles, no significant tenderness palpation of the right shoulder, normal range of motion of the right upper extremity.  Skin:    General: Skin is warm and dry.     Capillary Refill: Capillary refill takes less than 2 seconds.  Neurological:     Mental Status: He is alert and oriented to person, place, and time.  Psychiatric:        Mood and Affect: Mood normal.        Behavior: Behavior normal.     (all labs ordered are listed, but only abnormal results are displayed) Labs Reviewed  BASIC METABOLIC PANEL WITH GFR - Abnormal; Notable for the following components:  Result Value   Glucose, Bld 105 (*)    Calcium 8.6 (*)    All other components within normal limits  URINALYSIS, ROUTINE W REFLEX MICROSCOPIC - Abnormal; Notable for the following components:   Color, Urine STRAW (*)    Leukocytes,Ua SMALL (*)    Bacteria, UA RARE (*)    All other components within normal limits  CBC  D-DIMER, QUANTITATIVE  TROPONIN T, HIGH SENSITIVITY    EKG: EKG Interpretation Date/Time:  Sunday January 11 2024 08:34:50 EDT Ventricular Rate:  57 PR Interval:  195 QRS Duration:  100 QT Interval:  458 QTC Calculation: 446 R Axis:   34  Text Interpretation: No significant change since last tracing Sinus rhythm Anterior infarct, old Confirmed by Zackowski, Scott [phone]) on 01/11/2024 8:53:53 AM  Radiology: CT ABDOMEN PELVIS W CONTRAST Result Date: 01/11/2024 CLINICAL DATA:  Abdominal pain. Nonlocalized. History of  bladder carcinoma and nephrectomy. * Tracking Code: BO * EXAM: CT ABDOMEN AND PELVIS WITH CONTRAST TECHNIQUE: Multidetector CT imaging of the abdomen and pelvis was performed using the standard protocol following bolus administration of intravenous contrast. RADIATION DOSE REDUCTION: This exam was performed according to the departmental dose-optimization program which includes automated exposure control, adjustment of the mA and/or kV according to patient size and/or use of iterative reconstruction technique. CONTRAST:  OMNIPAQUE  IOHEXOL  300 MG/ML  SOLN COMPARISON:  None Available. FINDINGS: Lower chest: Lung bases are clear. Hepatobiliary: No focal hepatic lesion. Normal gallbladder. No biliary duct dilatation. Common bile duct is normal. Pancreas: Pancreas is normal. No ductal dilatation. No pancreatic inflammation. Spleen: Normal spleen Adrenals/urinary tract: Post RIGHT nephrectomy and adrenalectomy. No nodularity in the RIGHT nephrectomy bed. The LEFT adrenal gland and kidney are normal. Post cystectomy with ureteral diversion to a LEFT sided urostomy. No evidence of ureteral obstruction or hydronephrosis. Stomach/Bowel: Stomach, small bowel, appendix, and cecum are normal. The colon and rectosigmoid colon are normal. Vascular/Lymphatic: Abdominal aorta is normal caliber. No periportal or retroperitoneal adenopathy. No pelvic adenopathy. Reproductive: Post prostatectomy Other: No free fluid. Musculoskeletal: No aggressive osseous lesion. IMPRESSION: 1. No acute findings in the abdomen pelvis. 2. Post RIGHT nephrectomy and adrenalectomy. 3. Post cystectomy with ureteral diversion to a LEFT-sided urostomy. No evidence of ureteral obstruction or hydronephrosis. 4. Post prostatectomy. 5. No evidence of metastatic disease in the abdomen pelvis. Electronically Signed   By: Jackquline Boxer M.D.   On: 01/11/2024 10:07   DG Chest Portable 1 View Result Date: 01/11/2024 EXAM: 1 VIEW(S) XRAY OF THE CHEST  01/11/2024 08:46:00 AM COMPARISON: 12/17/2022 CLINICAL HISTORY: chest pain. Pt sts previous right nephrectomy for CA in 2017. Recent right kidney area pain that woke him up this morning with dark urine. No SOB. FINDINGS: LINES, TUBES AND DEVICES: Overlying monitor wires. LUNGS AND PLEURA: No focal pulmonary opacity. No pulmonary edema. No pleural effusion. No pneumothorax. Somewhat low inspiration. HEART AND MEDIASTINUM: No acute abnormality of the cardiac and mediastinal silhouettes. BONES AND SOFT TISSUES: No acute osseous abnormality. IMPRESSION: 1. Normal chest radiograph without acute cardiopulmonary abnormality. Electronically signed by: Waddell Calk MD 01/11/2024 09:33 AM EDT RP Workstation: [id number]     Procedures   Medications Ordered in the ED  iohexol  (OMNIPAQUE ) 300 MG/ML solution 100 mL (100 mLs Intravenous Contrast Given 01/11/24 0904)                                    Medical Decision Making Amount  and/or Complexity of Data Reviewed Labs: ordered. Radiology: ordered.  Risk Prescription drug management.   This patient is a 53 y.o. male  who presents to the ED for concern of flank pain.   Differential diagnoses prior to evaluation: The emergent differential diagnosis includes, but is not limited to,  AAA, renal vascular thrombosis, mesenteric ischemia, pyelonephritis, nephrolithiasis, cystitis, biliary colic, pancreatitis, PUD, appendicitis, diverticulitis, bowel obstruction, given his history, slight radiation up into the right side of the chest considered PE, atypical ACS, ACS, versus other intrathoracic pathology.  This is not an exhaustive differential.   Past Medical History / Co-morbidities / Social History: bladder cancer, solitary left kidney, urostomy  Physical Exam: Physical exam performed. The pertinent findings include: some mild hypertension, BP max 149/51m vital signs otherwise stable, afebrile  Lab Tests/Imaging studies: I personally interpreted  labs/imaging and the pertinent results include: BMP unremarkable, notably with normal kidney function.  UA with small leukocytes, rare bacteria, overall not consistent with acute urinary tract infection.  CBC unremarkable, negative troponin x 1, negative D-dimer.  [place] interpreted CT abdomen pelvis with contrast, plain film chest x-ray which shows no evidence of acute intra abdominal abnormality, intrathoracic abnormality to explain patient's pain.. I agree with the radiologist interpretation.  Cardiac monitoring: EKG obtained and interpreted by myself and attending physician which shows: Normal sinus rhythm, no significant change from baseline.   Medications: Encouraged Tylenol , offered muscle relaxant but patient declined.  With no emergent pathology identified, overall with high clinical suspicion for musculoskeletal etiology, thoracic back/serratus strain.   Disposition: After consideration of the diagnostic results and the patients response to treatment, I feel that patient stable for discharge, discussed return precautions, encouraged PCP follow-up as needed.   emergency department workup does not suggest an emergent condition requiring admission or immediate intervention beyond what has been performed at this time. The plan is: as above. The patient is safe for discharge and has been instructed to return immediately for worsening symptoms, change in symptoms or any other concerns.   Final diagnoses:  Right flank pain    ED Discharge Orders     None          Rosan Sherlean VEAR DEVONNA 01/11/24 1126    Geraldene Hamilton, MD 01/13/24 2215
